# Patient Record
Sex: Female | Born: 1965 | Race: White | Hispanic: No | Marital: Single | Smoking: Former smoker
Health system: Southern US, Community
[De-identification: ages and names within clinical notes are randomized; demographics above are authoritative.]

## PROBLEM LIST (undated history)

## (undated) DIAGNOSIS — F32A Depression, unspecified: Secondary | ICD-10-CM

## (undated) DIAGNOSIS — R42 Dizziness and giddiness: Secondary | ICD-10-CM

## (undated) DIAGNOSIS — I10 Essential (primary) hypertension: Secondary | ICD-10-CM

## (undated) DIAGNOSIS — E785 Hyperlipidemia, unspecified: Secondary | ICD-10-CM

## (undated) DIAGNOSIS — Z9289 Personal history of other medical treatment: Secondary | ICD-10-CM

## (undated) DIAGNOSIS — I509 Heart failure, unspecified: Secondary | ICD-10-CM

## (undated) DIAGNOSIS — K2970 Gastritis, unspecified, without bleeding: Secondary | ICD-10-CM

## (undated) DIAGNOSIS — F329 Major depressive disorder, single episode, unspecified: Secondary | ICD-10-CM

## (undated) DIAGNOSIS — K219 Gastro-esophageal reflux disease without esophagitis: Secondary | ICD-10-CM

## (undated) DIAGNOSIS — F419 Anxiety disorder, unspecified: Secondary | ICD-10-CM

## (undated) DIAGNOSIS — M25552 Pain in left hip: Secondary | ICD-10-CM

## (undated) DIAGNOSIS — M549 Dorsalgia, unspecified: Secondary | ICD-10-CM

## (undated) DIAGNOSIS — G8929 Other chronic pain: Secondary | ICD-10-CM

## (undated) DIAGNOSIS — G43009 Migraine without aura, not intractable, without status migrainosus: Secondary | ICD-10-CM

## (undated) HISTORY — DX: Other chronic pain: G89.29

## (undated) HISTORY — DX: Gastritis, unspecified, without bleeding: K29.70

## (undated) HISTORY — DX: Dizziness and giddiness: R42

## (undated) HISTORY — DX: Dorsalgia, unspecified: M54.9

## (undated) HISTORY — PX: ABLATION: SHX5711

## (undated) HISTORY — DX: Major depressive disorder, single episode, unspecified: F32.9

## (undated) HISTORY — DX: Depression, unspecified: F32.A

## (undated) HISTORY — PX: OTHER SURGICAL HISTORY: SHX169

## (undated) HISTORY — DX: Anxiety disorder, unspecified: F41.9

## (undated) HISTORY — DX: Hyperlipidemia, unspecified: E78.5

## (undated) HISTORY — DX: Migraine without aura, not intractable, without status migrainosus: G43.009

---

## 2002-04-21 ENCOUNTER — Emergency Department (HOSPITAL_COMMUNITY): Admission: EM | Admit: 2002-04-21 | Discharge: 2002-04-21 | Payer: Self-pay | Admitting: Emergency Medicine

## 2002-04-21 ENCOUNTER — Encounter: Payer: Self-pay | Admitting: Emergency Medicine

## 2002-09-04 DIAGNOSIS — I509 Heart failure, unspecified: Secondary | ICD-10-CM

## 2002-09-04 HISTORY — DX: Heart failure, unspecified: I50.9

## 2002-12-31 ENCOUNTER — Emergency Department (HOSPITAL_COMMUNITY): Admission: EM | Admit: 2002-12-31 | Discharge: 2002-12-31 | Payer: Self-pay | Admitting: *Deleted

## 2003-04-25 ENCOUNTER — Emergency Department (HOSPITAL_COMMUNITY): Admission: EM | Admit: 2003-04-25 | Discharge: 2003-04-25 | Payer: Self-pay | Admitting: Emergency Medicine

## 2003-09-05 HISTORY — PX: CHOLECYSTECTOMY: SHX55

## 2003-09-05 HISTORY — PX: LUMBAR DISC SURGERY: SHX700

## 2003-12-19 ENCOUNTER — Emergency Department (HOSPITAL_COMMUNITY): Admission: EM | Admit: 2003-12-19 | Discharge: 2003-12-19 | Payer: Self-pay | Admitting: Emergency Medicine

## 2004-01-13 ENCOUNTER — Ambulatory Visit (HOSPITAL_COMMUNITY): Admission: RE | Admit: 2004-01-13 | Discharge: 2004-01-13 | Payer: Self-pay | Admitting: Family Medicine

## 2004-02-15 ENCOUNTER — Ambulatory Visit (HOSPITAL_COMMUNITY): Admission: RE | Admit: 2004-02-15 | Discharge: 2004-02-15 | Payer: Self-pay | Admitting: Family Medicine

## 2004-02-24 ENCOUNTER — Ambulatory Visit (HOSPITAL_COMMUNITY): Admission: RE | Admit: 2004-02-24 | Discharge: 2004-02-24 | Payer: Self-pay | Admitting: Orthopaedic Surgery

## 2004-03-14 ENCOUNTER — Ambulatory Visit (HOSPITAL_COMMUNITY): Admission: RE | Admit: 2004-03-14 | Discharge: 2004-03-14 | Payer: Self-pay | Admitting: Family Medicine

## 2004-03-18 ENCOUNTER — Emergency Department (HOSPITAL_COMMUNITY): Admission: EM | Admit: 2004-03-18 | Discharge: 2004-03-19 | Payer: Self-pay | Admitting: *Deleted

## 2004-03-23 ENCOUNTER — Emergency Department (HOSPITAL_COMMUNITY): Admission: EM | Admit: 2004-03-23 | Discharge: 2004-03-23 | Payer: Self-pay | Admitting: Emergency Medicine

## 2004-03-25 ENCOUNTER — Ambulatory Visit (HOSPITAL_COMMUNITY): Admission: RE | Admit: 2004-03-25 | Discharge: 2004-03-25 | Payer: Self-pay | Admitting: *Deleted

## 2004-04-01 ENCOUNTER — Emergency Department (HOSPITAL_COMMUNITY): Admission: EM | Admit: 2004-04-01 | Discharge: 2004-04-02 | Payer: Self-pay | Admitting: *Deleted

## 2004-04-04 ENCOUNTER — Ambulatory Visit (HOSPITAL_COMMUNITY): Admission: RE | Admit: 2004-04-04 | Discharge: 2004-04-04 | Payer: Self-pay | Admitting: *Deleted

## 2004-04-08 ENCOUNTER — Inpatient Hospital Stay (HOSPITAL_COMMUNITY): Admission: RE | Admit: 2004-04-08 | Discharge: 2004-04-10 | Payer: Self-pay | Admitting: General Surgery

## 2004-05-11 ENCOUNTER — Ambulatory Visit (HOSPITAL_COMMUNITY): Admission: RE | Admit: 2004-05-11 | Discharge: 2004-05-12 | Payer: Self-pay | Admitting: Neurological Surgery

## 2004-06-08 ENCOUNTER — Ambulatory Visit (HOSPITAL_COMMUNITY): Admission: RE | Admit: 2004-06-08 | Discharge: 2004-06-08 | Payer: Self-pay | Admitting: Family Medicine

## 2004-08-18 ENCOUNTER — Ambulatory Visit (HOSPITAL_COMMUNITY): Admission: RE | Admit: 2004-08-18 | Discharge: 2004-08-18 | Payer: Self-pay | Admitting: Anesthesiology

## 2004-11-11 ENCOUNTER — Ambulatory Visit: Payer: Self-pay | Admitting: *Deleted

## 2004-11-20 ENCOUNTER — Emergency Department (HOSPITAL_COMMUNITY): Admission: EM | Admit: 2004-11-20 | Discharge: 2004-11-20 | Payer: Self-pay | Admitting: Emergency Medicine

## 2004-11-25 ENCOUNTER — Ambulatory Visit: Payer: Self-pay | Admitting: *Deleted

## 2004-12-16 ENCOUNTER — Ambulatory Visit (HOSPITAL_COMMUNITY): Admission: RE | Admit: 2004-12-16 | Discharge: 2004-12-16 | Payer: Self-pay | Admitting: Orthopaedic Surgery

## 2004-12-26 ENCOUNTER — Encounter (HOSPITAL_COMMUNITY): Admission: RE | Admit: 2004-12-26 | Discharge: 2005-01-25 | Payer: Self-pay | Admitting: Orthopaedic Surgery

## 2005-02-16 ENCOUNTER — Ambulatory Visit: Payer: Self-pay | Admitting: *Deleted

## 2005-02-21 ENCOUNTER — Encounter (HOSPITAL_COMMUNITY): Admission: RE | Admit: 2005-02-21 | Discharge: 2005-03-23 | Payer: Self-pay | Admitting: Orthopaedic Surgery

## 2005-02-24 ENCOUNTER — Ambulatory Visit: Payer: Self-pay | Admitting: Cardiovascular Disease

## 2005-02-24 ENCOUNTER — Ambulatory Visit (HOSPITAL_COMMUNITY): Admission: RE | Admit: 2005-02-24 | Discharge: 2005-02-24 | Payer: Self-pay | Admitting: *Deleted

## 2005-03-01 ENCOUNTER — Ambulatory Visit: Payer: Self-pay | Admitting: Cardiovascular Disease

## 2005-03-01 ENCOUNTER — Ambulatory Visit: Payer: Self-pay | Admitting: *Deleted

## 2005-03-03 ENCOUNTER — Emergency Department (HOSPITAL_COMMUNITY): Admission: EM | Admit: 2005-03-03 | Discharge: 2005-03-03 | Payer: Self-pay | Admitting: Emergency Medicine

## 2005-06-16 ENCOUNTER — Emergency Department (HOSPITAL_COMMUNITY): Admission: EM | Admit: 2005-06-16 | Discharge: 2005-06-16 | Payer: Self-pay | Admitting: Emergency Medicine

## 2006-03-30 ENCOUNTER — Ambulatory Visit: Payer: Self-pay | Admitting: *Deleted

## 2007-04-18 ENCOUNTER — Ambulatory Visit: Payer: Self-pay | Admitting: Cardiovascular Disease

## 2007-04-19 ENCOUNTER — Ambulatory Visit (HOSPITAL_COMMUNITY): Admission: RE | Admit: 2007-04-19 | Discharge: 2007-04-19 | Payer: Self-pay | Admitting: Cardiovascular Disease

## 2007-04-19 ENCOUNTER — Ambulatory Visit: Payer: Self-pay | Admitting: Cardiology

## 2007-05-31 ENCOUNTER — Ambulatory Visit (HOSPITAL_COMMUNITY): Admission: RE | Admit: 2007-05-31 | Discharge: 2007-05-31 | Payer: Self-pay | Admitting: Family Medicine

## 2007-07-02 ENCOUNTER — Encounter (HOSPITAL_COMMUNITY): Admission: RE | Admit: 2007-07-02 | Discharge: 2007-08-01 | Payer: Self-pay | Admitting: Neurological Surgery

## 2007-10-25 ENCOUNTER — Emergency Department (HOSPITAL_COMMUNITY): Admission: EM | Admit: 2007-10-25 | Discharge: 2007-10-25 | Payer: Self-pay | Admitting: Emergency Medicine

## 2007-11-27 ENCOUNTER — Ambulatory Visit (HOSPITAL_COMMUNITY): Admission: RE | Admit: 2007-11-27 | Discharge: 2007-11-27 | Payer: Self-pay | Admitting: Family Medicine

## 2008-01-21 ENCOUNTER — Ambulatory Visit (HOSPITAL_COMMUNITY): Admission: RE | Admit: 2008-01-21 | Discharge: 2008-01-21 | Payer: Self-pay | Admitting: Family Medicine

## 2008-09-04 HISTORY — PX: LUMBAR DISC SURGERY: SHX700

## 2008-11-05 ENCOUNTER — Ambulatory Visit (HOSPITAL_COMMUNITY): Admission: RE | Admit: 2008-11-05 | Discharge: 2008-11-05 | Payer: Self-pay | Admitting: Neurological Surgery

## 2008-12-17 ENCOUNTER — Encounter: Payer: Self-pay | Admitting: Cardiology

## 2009-01-01 ENCOUNTER — Encounter (INDEPENDENT_AMBULATORY_CARE_PROVIDER_SITE_OTHER): Payer: Self-pay | Admitting: *Deleted

## 2009-01-14 ENCOUNTER — Ambulatory Visit: Payer: Self-pay | Admitting: *Deleted

## 2009-01-22 ENCOUNTER — Ambulatory Visit (HOSPITAL_COMMUNITY): Admission: RE | Admit: 2009-01-22 | Discharge: 2009-01-22 | Payer: Self-pay | Admitting: Neurological Surgery

## 2009-07-14 ENCOUNTER — Ambulatory Visit (HOSPITAL_COMMUNITY): Admission: RE | Admit: 2009-07-14 | Discharge: 2009-07-14 | Payer: Self-pay | Admitting: Neurological Surgery

## 2009-09-10 ENCOUNTER — Inpatient Hospital Stay (HOSPITAL_COMMUNITY): Admission: RE | Admit: 2009-09-10 | Discharge: 2009-09-12 | Payer: Self-pay | Admitting: Neurological Surgery

## 2009-10-12 ENCOUNTER — Encounter: Admission: RE | Admit: 2009-10-12 | Discharge: 2009-10-12 | Payer: Self-pay | Admitting: Neurological Surgery

## 2009-12-07 ENCOUNTER — Encounter: Admission: RE | Admit: 2009-12-07 | Discharge: 2009-12-07 | Payer: Self-pay | Admitting: Neurological Surgery

## 2010-01-17 DIAGNOSIS — F341 Dysthymic disorder: Secondary | ICD-10-CM | POA: Insufficient documentation

## 2010-01-17 DIAGNOSIS — E785 Hyperlipidemia, unspecified: Secondary | ICD-10-CM | POA: Insufficient documentation

## 2010-01-17 DIAGNOSIS — R109 Unspecified abdominal pain: Secondary | ICD-10-CM | POA: Insufficient documentation

## 2010-01-17 DIAGNOSIS — I428 Other cardiomyopathies: Secondary | ICD-10-CM | POA: Insufficient documentation

## 2010-01-18 ENCOUNTER — Encounter (INDEPENDENT_AMBULATORY_CARE_PROVIDER_SITE_OTHER): Payer: Self-pay | Admitting: *Deleted

## 2010-01-19 ENCOUNTER — Encounter: Admission: RE | Admit: 2010-01-19 | Discharge: 2010-01-19 | Payer: Self-pay | Admitting: Neurological Surgery

## 2010-06-21 ENCOUNTER — Encounter: Admission: RE | Admit: 2010-06-21 | Discharge: 2010-06-21 | Payer: Self-pay | Admitting: Neurological Surgery

## 2010-07-07 ENCOUNTER — Ambulatory Visit (HOSPITAL_COMMUNITY): Admission: RE | Admit: 2010-07-07 | Discharge: 2010-07-07 | Payer: Self-pay | Admitting: Neurological Surgery

## 2010-09-04 DIAGNOSIS — Z9289 Personal history of other medical treatment: Secondary | ICD-10-CM

## 2010-09-04 HISTORY — DX: Personal history of other medical treatment: Z92.89

## 2010-09-24 ENCOUNTER — Encounter: Payer: Self-pay | Admitting: Family Medicine

## 2010-10-06 NOTE — Letter (Signed)
Summary: Appointment - Missed  [facility] [place]  [address] [place] [address]   Phone: [phone]  Fax: [phone]     Jan 18, 2010 MRN: [id number]   Pamela Mendez [address] [place], [place]  [address]   Dear Ms. [name],  Our records indicate you missed your appointment on      01/18/10                  with Joni Reining NP      .            It is very important that we reach you to reschedule this appointment. We look forward to participating in your health care needs. Please contact us at the number listed above at your earliest convenience to reschedule this appointment.     Sincerely,    Glass blower/designer

## 2010-10-26 ENCOUNTER — Ambulatory Visit: Payer: Self-pay | Admitting: Orthopedic Surgery

## 2010-10-27 ENCOUNTER — Other Ambulatory Visit (HOSPITAL_COMMUNITY): Payer: Self-pay | Admitting: Family Medicine

## 2010-10-27 ENCOUNTER — Encounter (HOSPITAL_COMMUNITY): Payer: Self-pay

## 2010-10-27 ENCOUNTER — Ambulatory Visit (HOSPITAL_COMMUNITY)
Admission: RE | Admit: 2010-10-27 | Discharge: 2010-10-27 | Disposition: A | Discharge status: Home / Self Care | Payer: Medicaid Other | Source: Ambulatory Visit | Attending: Family Medicine | Admitting: Family Medicine

## 2010-10-27 DIAGNOSIS — I509 Heart failure, unspecified: Secondary | ICD-10-CM | POA: Insufficient documentation

## 2010-10-27 HISTORY — DX: Heart failure, unspecified: I50.9

## 2010-10-27 HISTORY — DX: Essential (primary) hypertension: I10

## 2010-10-28 ENCOUNTER — Ambulatory Visit (HOSPITAL_COMMUNITY)
Admission: RE | Admit: 2010-10-28 | Discharge: 2010-10-28 | Disposition: A | Discharge status: Home / Self Care | Payer: Medicaid Other | Source: Ambulatory Visit | Attending: Family Medicine | Admitting: Family Medicine

## 2010-10-28 ENCOUNTER — Encounter: Payer: Self-pay | Admitting: Orthopedic Surgery

## 2010-10-28 DIAGNOSIS — I509 Heart failure, unspecified: Secondary | ICD-10-CM | POA: Insufficient documentation

## 2010-10-28 DIAGNOSIS — I10 Essential (primary) hypertension: Secondary | ICD-10-CM | POA: Insufficient documentation

## 2010-11-01 NOTE — Letter (Signed)
Summary: Referral appointment explanation  Referral appointment explanation   Imported By: Jacklynn Ganong 10/28/2010 08:37:33  _____________________________________________________________________  External Attachment:    Type:   Image     Comment:   External Document

## 2010-11-09 ENCOUNTER — Ambulatory Visit (INDEPENDENT_AMBULATORY_CARE_PROVIDER_SITE_OTHER): Payer: Medicaid Other | Admitting: Cardiovascular Disease

## 2010-11-09 ENCOUNTER — Encounter: Payer: Self-pay | Admitting: Cardiovascular Disease

## 2010-11-09 DIAGNOSIS — E785 Hyperlipidemia, unspecified: Secondary | ICD-10-CM

## 2010-11-09 DIAGNOSIS — R0602 Shortness of breath: Secondary | ICD-10-CM

## 2010-11-09 DIAGNOSIS — R609 Edema, unspecified: Secondary | ICD-10-CM | POA: Insufficient documentation

## 2010-11-09 DIAGNOSIS — I10 Essential (primary) hypertension: Secondary | ICD-10-CM

## 2010-11-15 LAB — CREATININE, SERUM: GFR calc Af Amer: >60 mL/min (ref >60)

## 2010-11-15 NOTE — Assessment & Plan Note (Addendum)
Summary: **per Dr.Scott Luking for DOE and hx of CHF/tg   Visit Type:  Follow-up Primary Provider:  Dr.Scott Luking  CC:  DOE and hx of chf.  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  Mild exertional dyspnea.  Had increasing LE edema and torsemide increased  Reviewed echo from 2/21 and EF normla 55-60% with no valve disease.  Edema improved now.  Denies salt indiscretion.  Paresthesias in LE's off neurontin  Improved since back surgery last year.  No PND, orthopnea or SSCP.  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 Take One Tablet By Mouth Daily 2)  Zocor 20 Mg Tabs (Simvastatin) .... Take One Tab Once Daily 3)  Simvastatin 20 Mg Tabs (Simvastatin) .... Take One Tablet By Mouth Daily At Bedtime 4)  Prilosec 20 Mg Cpdr (Omeprazole) .... Take 1 Tab Daily 5)  Torsemide 20 Mg Tabs (Torsemide) .... 3 in The Am 2 Tabs Noon 6)  Zyrtec Allergy 10 Mg Tabs (Cetirizine Hcl) .... Take 1 Tab Daily 7)  Klor-Con 20 Meq Pack (Potassium Chloride) .... Take 2 Tabs Two Times A Day  Allergies (verified): No Known Drug Allergies  Comments:  Nurse/Medical Assistant: patient brought meds she uses [facility] in [place]  Past History:  Past Medical History: Last updated: February 05, 2010 Current Problems:  HYPERLIPIDEMIA (ICD-272.4) DEPRESSION/ANXIETY (ICD-300.4) CARDIOMYOPATHY, MILD (ICD-425.4) ABDOMINAL PAIN, UNSPECIFIED SITE (ICD-789.00)  Past Surgical History: Last updated: 02-05-10 lumbar disk surgery childbirth times 3  Family History: Last updated: 02/05/10 Father:deceased due to myocardial infarction age 58 Mother:alive age 14  Social History: Last updated: 2010/02/05 Single  Tobacco Use -  Yes.  Alcohol Use - no Regular Exercise - no Drug Use - no  Review of Systems       Denies fever, malais, weight loss, blurry vision, decreased visual acuity, cough, sputum, SOB, hemoptysis, pleuritic pain, heartburn, abdominal pain, melena, l claudication, or rash.   Vital Signs:  Patient profile:   45 year old female Height:      66 inches Weight:      204 pounds BMI:     33.05 Pulse rate:   85 / minute BP sitting:   118 / 73  (left arm)  Vitals Entered By: Dreama Saa, CNA (November 09, 2010 2:42 PM)  Physical Exam  General:  Affect appropriate Healthy:  appears stated age HEENT: normal Neck supple with no adenopathy JVP normal no bruits no thyromegaly Lungs clear with no wheezing and good diaphragmatic motion Heart:  S1/S2 no murmur,rub, gallop or click PMI normal Abdomen: benighn, BS positve, no tenderness, no AAA no bruit.  No HSM or HJR Distal pulses intact with no bruits No edema Neuro non-focal Skin warm and dry    Impression & Recommendations:  Problem # 1:  HYPERLIPIDEMIA (ICD-272.4) Continue statin  Labs per primary Her updated medication list for this problem includes:    Zocor 20 Mg Tabs (Simvastatin) .Marland Kitchen... Take one tab once daily    Simvastatin 20 Mg Tabs (Simvastatin) .Marland Kitchen... Take one tablet by mouth daily at bedtime  Problem # 2:  CARDIOMYOPATHY, MILD (ICD-425.4) Resolved EF normal by echo.   Her updated medication list for this problem includes:    Lisinopril 5 Mg Tabs (Lisinopril) .Marland Kitchen... Take one tablet by mouth daily  Torsemide 20 Mg Tabs (Torsemide) .Marland KitchenMarland KitchenMarland KitchenMarland Kitchen 3 in the am 2 tabs noon  Problem # 3:  EDEMA (ICD-782.3) Non cardiac etiology.  Primary can consider 24 hr uring protein TSH and venous duplex.  Improved Low sodium diet  Patient Instructions: 1)  Your physician recommends that you schedule a follow-up appointment in: 1 year 2)  Your physician recommends that you continue on your current medications as directed. Please refer to the  Current Medication list given to you today.

## 2010-11-20 LAB — DIFFERENTIAL
Basophils Absolute: 0.1 10*3/uL (ref 0.0–0.1)
Basophils Relative: 1 % (ref 0–1)
Eosinophils Absolute: 0.3 10*3/uL (ref 0.0–0.7)
Eosinophils Relative: 3 % (ref 0–5)
Monocytes Absolute: 1 10*3/uL (ref 0.1–1.0)
Neutro Abs: 5.1 10*3/uL (ref 1.7–7.7)

## 2010-11-20 LAB — BASIC METABOLIC PANEL
CO2: 29 mEq/L (ref 19–32)
Calcium: 9.1 mg/dL (ref 8.4–10.5)
Glucose, Bld: 76 mg/dL (ref 70–99)
Sodium: 135 mEq/L (ref 135–145)

## 2010-11-20 LAB — CBC
HCT: 43.3 % (ref 36.0–46.0)
Hemoglobin: 15 g/dL (ref 12.0–15.0)
MCHC: 34.7 g/dL (ref 30.0–36.0)
MCV: 90.2 fL (ref 78.0–100.0)
RDW: 13.1 % (ref 11.5–15.5)

## 2010-12-13 LAB — BASIC METABOLIC PANEL
CO2: 30 mEq/L (ref 19–32)
Chloride: 100 mEq/L (ref 96–112)
GFR calc Af Amer: >60 mL/min (ref >60)
Potassium: 3.6 mEq/L (ref 3.5–5.1)
Sodium: 139 mEq/L (ref 135–145)

## 2010-12-13 LAB — CBC
Hemoglobin: 14.4 g/dL (ref 12.0–15.0)
MCHC: 35.2 g/dL (ref 30.0–36.0)
MCV: 89.6 fL (ref 78.0–100.0)
RBC: 4.56 MIL/uL (ref 3.87–5.11)
WBC: 8.3 10*3/uL (ref 4.0–10.5)

## 2010-12-13 LAB — DIFFERENTIAL
Basophils Relative: 1 % (ref 0–1)
Eosinophils Absolute: 0.2 10*3/uL (ref 0.0–0.7)
Lymphs Abs: 2 10*3/uL (ref 0.7–4.0)
Monocytes Absolute: 0.9 10*3/uL (ref 0.1–1.0)
Monocytes Relative: 11 % (ref 3–12)

## 2010-12-13 LAB — ABO/RH: ABO/RH(D): O NEG

## 2010-12-13 LAB — TYPE AND SCREEN: Antibody Screen: NEGATIVE

## 2010-12-13 LAB — APTT: aPTT: 28 seconds (ref 24–37)

## 2011-01-17 NOTE — Assessment & Plan Note (Signed)
[facility]                       [place] [facility] OFFICE NOTE   Pamela Mendez                       MRN:          [id number]  DATE:04/18/2007                            DOB:          July 04, 1966    Pamela Mendez is seen today as a new patient by me.  She has previously been  seen by Pamela Mendez.  She called the office for refills on her medication  and basically needed to be reevaluated since she has not been seen in  awhile.  The patient has a history of postpartum cardiomyopathy.  This  was diagnosed in 2004 with the birth of her youngest daughter, Pamela Mendez.  Interestingly the patient is divorced.  She has a 23-year-old daughter  who is with her today, but her other children are 51 and 19.   The patient's coronary risk factors include hyperlipidemia,  hypertension, and smoking.   She has not had any significant PND or orthopnea.  There has been mild  exertional dyspnea.  She is functional class I.  She does have some  chronic lower extremity edema which she takes Demadex for.  She has not  had any significant chest pain or syncope.   In talking to the patient she has been doing fairly well.  She continues  to smoke.  She used to smoke two packs a day and says she is down to  four or five cigarettes a day now.   She was not interested in Chantix or Wellbutrin.   The patient has been compliant with her medications.  She does tend to  watch the salt in her diet as she is aware of the relationship to her  lower extremity edema.   She does not work, but is active at home particularly with her 5-year-  old daughter.   REVIEW OF SYSTEMS:  Otherwise, negative.   MEDICATIONS:  1. Klor-Con 20 a day.  2. Simvastatin 20 a day.  3. Lisinopril 5 a day.  4. An aspirin a day.  5. Demadex 40 a day.  6. Lyrica 75 b.i.d.   She has had previous lower back surgeries.  She is getting increasing  sciatica on the left side and may need repeat surgery.   Her neurosurgeon  is Dr. Marikay Mendez.   PHYSICAL EXAMINATION:  GENERAL:  A healthy-appearing middle-aged white  female in no distress.  Affect is appropriate.  She does have nicotine  on her breath with staining of her teeth.  VITAL SIGNS:  Her weight is stable at 186, blood pressure 118/74, pulse  72 and regular, afebrile, respiratory rate 14.  HEENT:  Normal.  NECK:  Supple.  There is no thyromegaly, no lymphadenopathy, no JVP  elevation, no bruits.  LUNGS:  Clear with good diaphragmatic motion, no wheezing.  BACK:  She has previous lumbar surgery scar on the lower back.  HEART:  There is an S1, S2 with normal heart sounds.  PMI is normal.  ABDOMEN:  Benign.  Bowel sounds positive.  No tenderness, no  hepatosplenomegaly, hepatojugular reflux, no AAA, no bruits.  EXTREMITIES:  Femorals are +4 bilaterally without  bruit.  PT's are +3  bilaterally.  There is trace lower extremity edema.  NEUROLOGIC:  Nonfocal.  There is no muscular weakness.  SKIN:  Warm and dry.   Her baseline EKG shows sinus rhythm with poor R wave progression.   IMPRESSION:  1. History of postpartum cardiomyopathy currently functional class 1.      Her last echocardiogram done in June 2006 showed an EF 60% with      recovery particularly since she is still having lower extremity      edema and may need repeat back surgery.  She will have a followup      2D echocardiogram to assess this.  She will continue her current      dosages of medicine including 5 of lisinopril.  2. Lower extremity edema.  May be more related to venous      insufficiency.  It is mild.  She will continue her Demadex and low      salt diet.  3. Hypercholesterolemia on Simvastatin 20 a day.  She will follow up      with Pamela Mendez who will be her new primary care doctor.  She      will get a fasting lipid and liver profile.  4. Lumbar sciatica.  Previous surgery two years ago.  Possible need      for repeat surgery.  So long as her left  ventricle function is      normal by echocardiogram, I do not think she would need any further      workup and she would be cleared from surgery from a cardiac      standpoint.  5. Smoking.  The patient will continue to try to quit on her own.  She      is actually a reasonable candidate for nicotine replacement, and I      told her that she would have options of Chantix, Wellbutrin or      nicotine patches.  She will again follow up with Pamela Mendez in      regards to this.   So long as her echocardiogram shows normal left ventricular function,  she will be seen in a year.  I did tell her that I would like to check a  BMET and BNP as well to assess her filling pressures and make sure her  potassium and creatinine are in a good range with her current dosages of  diuretic and Klor-Con.     Pamela Pick. Eden Emms, MD, [facility]  Electronically Signed    PCN/MedQ  DD: 04/18/2007  DT: 04/19/2007  Job #: [id number]

## 2011-01-17 NOTE — Consult Note (Signed)
VASCULAR SURGERY CONSULTATION   Pamela Mendez, Pamela Mendez  DOB:  07/09/1966                                       01/14/2009  [id number]#:[id number]   REFERRING PHYSICIAN:  Marikay Alar, MD   PRIMARY CARE PHYSICIAN:  Scott A. Luking, MD   REFERRAL DIAGNOSIS:  L5-S1 degenerative disk disease.   HISTORY:  The patient is a 46 year old female with a history of chronic  back pain.  It is now quite severe.  It is worsen when she is more  active or with prolonged sitting, standing or walking.  She does have  some radiation pain into her left buttock and left leg.  Back pain is  the most severe component of her discomfort.  No bowel or bladder  symptoms.  Denies numbness, tingling or weakness.   No history of DVT or pulmonary embolus.  No major abdominal surgery.  She has had a laparoscopic cholecystectomy.   PAST MEDICAL HISTORY:  Postpartum congestive heart failure.   MEDICATIONS:  1. Klor-Con M20 one tablet daily.  2. Demadex 20 mg b.i.d.  3. Lisinopril 5 mg daily.  4. Prilosec 20 mg daily.  5. Zocor 20 mg daily.  6. Allegra D one tablet b.i.d.  7. Aspirin 325 mg daily.   ALLERGIES:  Sulfa.   SOCIAL HISTORY:  The patient is single.  She has three children.  She is  currently unemployed.  She smokes 3-4 cigarettes daily.  No regular  alcohol use.   FAMILY HISTORY:  Mother is living age 5, generally well.  Father  deceased age 70 with a history of heart disease and cancer.  Three  siblings are well.   REVIEW OF SYSTEMS:  The patient notes some shortness of breath with  exertion.  Occasional headaches.  Refer to patient encounter form.   PHYSICAL EXAM:  General:  A 45 year old female appears approximately her  stated age.  Alert and oriented.  No acute distress.  Vital signs:  BP  111/74, pulse 84 per minute.  HEENT:  Unremarkable.  Neck:  Supple.  No  thyromegaly or adenopathy.  Chest:  Equal entry bilaterally without  rales or rhonchi.  Cardiovascular:  No carotid  bruits.  Normal heart  sounds without murmurs.  Regular rate and rhythm.  Abdomen:  Soft,  nontender.  Laparoscopic cholecystectomy scar is noted.  No masses or  organomegaly.  Lower extremities:  Intact femoral, popliteal, posterior  tibial, dorsalis pedis pulses.  No ankle edema.  Neurological:  Cranial  nerves intact.  Strength equal bilaterally.  1+ reflexes.  Skin:  Warm,  dry and intact.   IMPRESSION:  1. L5-S1 degenerative disk disease.  2. Remote history of postpartum congestive heart failure.   RECOMMENDATIONS:  The patient is a candidate to undergo L5-S1 ALIF.  Potential risks of the operative procedure have been reviewed with the  patient.  These include but are not limited to bleeding, transfusion,  limb ischemia, DVT, pulmonary embolus, ureter injury, hernia or deep  infection.   Balinda Quails, M.D.  Electronically Signed  PGH/MEDQ  D:  01/14/2009  T:  01/15/2009  Job:  [id number]   cc:   Tia Alert, MD  Scott A. Gerda Diss, MD

## 2011-01-17 NOTE — Procedures (Signed)
NAMEROWYN, MUSTAPHA NO.:  [id number]   MEDICAL RECORD NO.:  [id number]          PATIENT TYPE:  OUT   LOCATION:  RAD                           FACILITY:  [facility]   PHYSICIAN:  Gerrit Friends. Dietrich Pates, MD, FACCDATE OF BIRTH:  09-19-1965   DATE OF PROCEDURE:  04/19/2007  DATE OF DISCHARGE:  04/19/2007                                ECHOCARDIOGRAM   CLINICAL DATA:  [age]-year-old woman with hypertension.   M-MODE:  Aorta 2.7, left atrium 3.5, septum 1.1, posterior wall 1, LV  diastole 4.7, LV systole 3.8.   FINDINGS:  1. Technically suboptimal, but adequate echocardiographic study.  2. Trileaflet aortic valve with minimal sclerosis and normal function.  3. Normal left atrium, right atrium and right ventricle.  4. Normal pulmonic valve and proximal pulmonary artery.  5. Normal tricuspid and mitral valve; physiologic tricuspid      regurgitation.  6. Left ventricular size at the upper limit of normal; no LVH; normal      regional and global function.  7. Normal IVC.  8. Comparison with prior study of February 24, 2005:  No significant      interval change.      Gerrit Friends. Dietrich Pates, MD, [facility]  Electronically Signed     RMR/MEDQ  D:  04/24/2007  T:  04/25/2007  Job:  [id number]

## 2011-01-20 NOTE — Op Note (Signed)
NAME:  Pamela Mendez, Pamela Mendez                          ACCOUNT NO.:  [id number]   MEDICAL RECORD NO.:  [id number]                   PATIENT TYPE:  INP   LOCATION:  A211                                 FACILITY:  [facility]   PHYSICIAN:  Dirk Dress. Katrinka Blazing, M.D.                DATE OF BIRTH:  [birth year]   DATE OF PROCEDURE:  04/08/2004  DATE OF DISCHARGE:  04/10/2004                                 OPERATIVE REPORT   PREOPERATIVE DIAGNOSIS:  Chronic cholecystitis, cholelithiasis.   POSTOPERATIVE DIAGNOSIS:  Chronic cholecystitis, cholelithiasis.   PROCEDURE:  Laparoscopic cholecystectomy.   SURGEON:  Dirk Dress. Katrinka Blazing, M.D.   DESCRIPTION OF PROCEDURE:  Under general anesthesia the patient's abdomen  was prepped and draped in a sterile field.  A supraumbilical midline  incision was made and a Veress needle was inserted uneventfully.  The  abdomen was insufflated with 1 liters of CO2.  Using a Vis-A-Port guide a 10-  mm port was placed.  A laparoscope was placed.  A distended, minimally  thickened, gallbladder was noted.   Under videoscopic guidance a 10-mm port and two 5-mm ports were placed in  the right subcostal region.  The gallbladder was grasped and positioned by  the assistant.  The cystic artery was dissected. There was some enlargement  of the lymph nodes in this area.  The cystic artery was followed back to the  gallbladder, clipped with 3 clips and divided.  The cystic duct ran  transverse to the gallbladder.  It was dissected and followed back to the  gallbladder and clipped close to the gallbladder with 5 clips and divided.  There was a small posterior cystic artery branch which was clipped with 3  clips and divided.   Using electrocautery the gallbladder was separated from the infrahepatic bed  without difficulty.  It was placed in an EndoCatch device and retrieved.  There was no bleeding from the bed.  There was no evidence of bile leak.  Copious irrigation was carried out and the fluid  was entirely clear.  The  patient tolerated the procedure well.  CO2 was allowed to escape from the  abdomen and the ports were removed.  The incisions were closed with #0 Dexon  on the fascia and staples on the skin.  The patient was awakened from  anesthesia,  transferred to a bed, and taken to the postanesthetic care unit  for further monitoring.      ___________________________________________                                            Dirk Dress. Katrinka Blazing, M.D.   LCS/MEDQ  D:  04/08/2004  T:  04/10/2004  Job:  [id number]

## 2011-01-20 NOTE — Procedures (Signed)
NAMENUVIA, HILEMAN NO.:  [id number]   MEDICAL RECORD NO.:  [id number]          PATIENT TYPE:  OUT   LOCATION:  RAD                           FACILITY:  [facility]   PHYSICIAN:  Charlton Haws, M.D.     DATE OF BIRTH:  1965/10/07   DATE OF PROCEDURE:  02/24/2005  DATE OF DISCHARGE:                                  ECHOCARDIOGRAM   PROCEDURE:  2-D echocardiogram.   INDICATIONS FOR PROCEDURE:  Shortness of breath.  Clinical information.  The  patient has hypertension.   RESULTS:  Left ventricular cavity size was normal.  There was no LVH.  Septal thickness was 10 mm.  Ejection fraction was 60%.  Mitral, aortic, and  tricuspid valves are structurally normal.  There was no significant stenosis  or regurgitation.  Left atrium and right-sided cardiac chambers were normal.  There is no evidence of pulmonary hypertension.  Subcostal imaging revealed  no atrial septal defect, no source of embolus, and no effusion.   IMPRESSION:  1.  Normal ejection fraction at 60%, no left ventricular hypertrophy.  2.  Normal left atrium, right atrium, and right ventricle.  3.  No evidence of pulmonary hypertension.  4.  Normal aortic, mitral, and tricuspid valves.  5.  No pericardial effusions.       PN/MEDQ  D:  02/24/2005  T:  02/24/2005  Job:  [id number]

## 2011-01-20 NOTE — Procedures (Signed)
NAMEEMBERLEY, KRAL NO.:  [id number]   MEDICAL RECORD NO.:  [id number]          PATIENT TYPE:  OUT   LOCATION:  RAD                           FACILITY:  [facility]   PHYSICIAN:  Charlton Haws, M.D.     DATE OF BIRTH:  Oct 08, 1965   DATE OF PROCEDURE:  DATE OF DISCHARGE:                                  ECHOCARDIOGRAM   Ms. Blacksher is a 45 year old patient of Dr. Dorethea Clan and Dr. Lady Gary. She has a  history of post partum dilated cardiomyopathy with previous EFs in the 45 to  50% range in 2004. She has been having chest pain. The study is done to rule  out ischemia.   The patient exercised 7 minutes and 24 seconds on a standard Bruce protocol.  Exercise was stopped due to fatigue. EKG showed sinus rhythm with  nonspecific ST-T wave changes.   Resting echocardiographic images showed mild left ventricular cavity  enlargement. The ejection fraction was normal in the 55 to 60% range. There  were no baseline regional wall motion abnormalities.   Stress images were captured in a range of heart rates of 140 to 150. The  septum and apex were somewhat poorly visualized. With stress, the ejection  fraction increased to the 75% range. LV cavity size decreased. There was a  possible small area of septal hypokinesis seen on short axis imaging;  however, overall, this would be considered low risk study.   IMPRESSION:  Low risk stress echocardiogram, possible small area of septal  hypokinesis with stress but area poorly visualized. Decreased left  ventricular cavity size with stress and ejection fraction increasing to 75%.       PN/MEDQ  D:  02/24/2005  T:  02/24/2005  Job:  [id number]   cc:   Vida Roller, M.D.  Fax: [phone]   Dr. Lady Gary

## 2011-01-20 NOTE — H&P (Signed)
NAME:  Pamela Mendez, Pamela Mendez                          ACCOUNT NO.:  [id number]   MEDICAL RECORD NO.:  [id number]                   PATIENT TYPE:  AMB   LOCATION:  DAY                                  FACILITY:  [facility]   PHYSICIAN:  Jerolyn Shin C. Katrinka Blazing, M.D.                DATE OF BIRTH:  [birth year]   DATE OF ADMISSION:  DATE OF DISCHARGE:                                HISTORY & PHYSICAL   A 45 year old female with a history of recurrent severe abdominal pain,  nausea, vomiting.  She has had the pain for quite some time.  She was seen  in the emergency room last weekend and a gallbladder ultrasound showed  multiple gallstones.  She has chronic nausea.  The patient remains  symptomatic and is scheduled for laparoscopic cholecystectomy.   PAST HISTORY:  1. Postpartum cardiomyopathy which has been followed for about two years in     [place].  She had a recent echocardiogram which showed a normal     left ventricular function with no wall abnormalities.  Recent evaluation     by Dr. Dorethea Clan, on April 06, 2004, suggested that no further cardiac     workup is necessary, and he has made some changes in her cardiovascular     medication.  2. Lumbar disk disease with L5-S1 HNP with left sided L5 nerve root     compression.  She is presently being considered for operative therapy of     this.   MEDICATIONS:  1. Flexeril 5 mg t.i.d.  2. Zocor 20 mg every day.  3. Digoxin 0.25 mg every day.  4. Potassium chloride 20 mEq every day.  5. Aspirin 325 mg every day.  6. Wellbutrin 150 mg every day.  7. Oxycodone q.4h. p.r.n. pain.  8. Valium 5 mg q.6h. p.r.n. pain.  Demadex, Coreg, and Captopril are presently being titrated by Dr. Dorethea Clan,  and I do not have the most recent dose.   SOCIAL HISTORY:  The patient is unemployed.  She is single.  She has three  children.  She smokes about a pack of cigarettes per day.  She does not  drink alcohol, and she denies drug use.   REVIEW OF SYSTEMS:  Positive  for abdominal pain and back pain.   FAMILY HISTORY:  Father died of heart attack at age 55.  He also had lung  cancer.  Mother is alive at age 36.  She has three siblings who are alive  and well.   PHYSICAL EXAMINATION:  VITAL SIGNS:  Blood pressure 120/78, pulse 80,  respirations 20, weight 176 pounds.  HEENT:  Unremarkable except for poor dentition.  NECK:  Supple.  Neck shows no JVD, bruit, adenopathy, or thyromegaly.  CHEST:  Clear to auscultation.  HEART:  Regular rate and rhythm.  No murmur, gallop or rub.  ABDOMEN:  Moderate epigastric and right upper quadrant tenderness with some  right lower quadrant tenderness.  She has normoactive bowel sounds.  EXTREMITIES:  No cyanosis, clubbing, or edema.  Good peripheral pulses.  No  joint deformity.  BACK:  Reveals diffuse lumbar tenderness without muscle spasm.  Straight leg  raising is positive on the left at 30 degrees, absent on the right.   IMPRESSION:  1. Cholelithiasis with cholecystitis.  2. Severe lumbar disk disease with L5-S1 herniated nucleus pulposus.  3. Postpartum cardiomyopathy, well compensated with recent normal left     ventricular ejection fraction with no evidence of wall abnormalities.  4. Depression.  5. Hyperlipidemia.   PLAN:  The patient will have laparoscopic cholecystectomy and will have  telemetry monitoring in the early postoperative period.  If she should  develop any problems, cardiology will be consulted.     ___________________________________________                                         Dirk Dress. Katrinka Blazing, M.D.   LCS/MEDQ  D:  04/07/2004  T:  04/07/2004  Job:  [id number]

## 2011-01-20 NOTE — Assessment & Plan Note (Signed)
[facility]                         Pamela CARDIOLOGY OFFICE NOTE   Mendez, Pamela Mendez                       MRN:          [id number]  DATE:03/30/2006                            DOB:          07-15-1966    PRIMARY:  Used to be Mirna Mires, I am not sure who it is now.   [name] returns.  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  Ejection fraction was down to  about 45%.  She subsequently resolved that with an echocardiogram a year  later where she had normal LV systolic function.  Subsequent repeat of that  echocardiogram in 2006 showed normal LV systolic function.  I saw her back  in 2003 and at that time she had some discomfort in her chest as well so we  got a stress echocardiogram which also looked normal and her EF augmented to  75% so overall she is doing well.  She has a little bit of hypertension and  she is on Zestril for that, but otherwise feels well.   CURRENT MEDICATIONS:  1.  Zocor 20 mg once a day.  2.  Zestril 5 mg once a day.  3.  Aspirin 325 once a day.  4.  She takes Lyrica 75 mg twice a day.  5.  Prilosec 20 mg a day.   PHYSICAL EXAMINATION:  VITAL SIGNS:  Her blood pressure is 106/78, her pulse  is 78.  She weighs 182 pounds.  CHEST:  Clear.  NECK:  She has no jugular venous distention or carotid bruits.  CARDIOVASCULAR:  Regular with no murmur.  EXTREMITIES:  Her lower extremities are without significant edema.  There  may be some mild trace edema at the ankles but it is relatively mild.   Overall, I think she is doing well.  Her myopathy appears to have resolved.  Her primary care physician is following her cholesterol and blood pressure  and doing a reasonably good job with that, she tells me so we are going to  get her seen on an as needed basis if she needs to be seen here again.                                   Farris Has. Dorethea Clan, MD   JMH/MedQ  DD:  03/30/2006  DT:  03/30/2006  Job #:  [id number]

## 2011-01-20 NOTE — Procedures (Signed)
NAME:  Pamela Mendez, Pamela Mendez                          ACCOUNT NO.:  [id number]   MEDICAL RECORD NO.:  [id number]                   PATIENT TYPE:  OUT   LOCATION:  RAD                                  FACILITY:  [facility]   PHYSICIAN:  [place] Bing, M.D.               DATE OF BIRTH:  1966/04/12   DATE OF PROCEDURE:  03/25/2004  DATE OF DISCHARGE:                                  ECHOCARDIOGRAM   CONSULTING PHYSICIAN:  [place] Bing, M.D.   REFERRING:  Imagene Sheller.   CLINICAL DATA:  A 44 year old woman with a history of hypertension and  congestive heart failure.   M-MODE:  1. Aorta 2.7.  2. Left atrium 3.0.  3. Septum 1.1.  4. Posterior wall 0.9.  5. LV diastole 4.5.  6. LV systole 2.9.   1. Technically adequate echocardiographic study.  2. Normal left atrium, right atrium, and right ventricle.  3. Normal mitral, tricuspid, and aortic valves.  Mild mitral anular     calcification and mild calcification of the proximal ascending aorta.  4. Pulmonic valve not well seen - probably normal.  5. Normal internal dimension of the left ventricle.  No LVH.  Normal     regional and global LV systolic function.  6. Normal IVC.      ___________________________________________                                            [place] Bing, M.D.   RR/MEDQ  D:  03/25/2004  T:  03/25/2004  Job:  [id number]

## 2011-01-20 NOTE — Op Note (Signed)
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   PROCEDURE:  Lumbar hemilaminectomy, medial facetectomy, foraminotomy, L5-S1  on the left, microdiskectomy at L5-S1 on the left utilizing microscopic  dissection.   SURGEON:  Tia Alert, M.D.   ANESTHESIA:  General endotracheal.   COMPLICATIONS:  None apparent.   INDICATIONS FOR PROCEDURE:  [name] is a 45 year old white female who was  referred to the neurosurgical unit with complaints of left leg pain and an  S1 distribution. She had an MRI which showed a herniated disk at L5-S1 on  the left. She had tried medical management for some time without significant  relief.  I recommended a lumbar microdiskectomy at L5-S1 on the left. She  understood the risks, benefits, and alternatives and wished to proceed.   DESCRIPTION OF PROCEDURE:  The patient was taken to the operating room and  after induction of adequate endotracheal anesthesia, she was rolled on the  prone position of the Wilson frame and all pressure points were padded and  the lumbar region was prepped with Duraprep and then draped in the usual  sterile fashion.  5 mL of local anesthesia was injected and then a dorsal  midline incision was made and carried down to the lumbosacral fascia. The  fascia was opened and the paraspinous musculature was taken down in a  subperiosteal fashion to expose the L5-S1 interspace on the left side.  Intraoperative x-ray  confirmed the level and then a hemilaminectomy, medial  facetectomy and foraminotomy was performed at L5-S1 on the left utilizing  the Kerrison punch.  The yellow ligament was identified, opened and removed  with the Kerrison punch to expose the underlying dura and S1 nerve root. The  S1 nerve root was retracted medially and a fairly large subannular disk  herniation was identified. This was incised after the epidural venous  vasculature was coagulated and cut sharply. A thorough intradiskal  diskectomy was then performed with pituitary rongeurs. I then used nerve  hooks to fill into the midline and entered the foramen to assure adequate  decompression of the nerve root, irrigated it with copious amounts of  bacitracin containing saline solution, dried all bleeding points, removed  the retractor and closed the fascia with #1 Vicryl, closed the subcutaneous  and subcuticular tissue with 2-0 and 3-0 Vicryl and closed  the skin with  Dermabond. The drapes were removed, the patient was awakened from general  anesthesia and transferred to the recovery room in stable condition.  At the  end of the procedure, all sponge, needle and instrument counts were correct.                                               Tia Alert, MD   DSJ/MEDQ  D:  05/11/2004  T:  05/11/2004  Job:  [id number]

## 2011-01-20 NOTE — Procedures (Signed)
NAMECALY, PELLUM NO.:  [id number]   MEDICAL RECORD NO.:  [id number]          PATIENT TYPE:  OUT   LOCATION:  RAD                           FACILITY:  [facility]   PHYSICIAN:  Charlton Haws, M.D.     DATE OF BIRTH:  [birth year]   DATE OF PROCEDURE:  DATE OF DISCHARGE:                                    STRESS TEST   HISTORY:  Ms. Skora is a 45 year old female with history of postpartum  dilated cardiomyopathy with an EF 45-50% back in 2004. She had a repeat  echocardiogram in July 2005 which revealed normal LV function. She now  presents with recurrent peripheral edema and some chest discomfort with  exercise.   BASELINE DATA:  Electrocardiogram reveals sinus rhythm at 87 beats per  minute with nonspecific ST abnormalities. Blood pressure is 122/80.   Patient exercised for a total of 7 minutes of 24 seconds Bruce protocol  stage 3 at 10.1. Maximal heart rate was 157 beats per minute which is 86% of  predicted maximum. Maximum blood pressure is 168/72 and resolved down to  128/68 in recovery. The patient reported some shortness of breath. No chest  discomfort was noted. EKG revealed PVCs. She had upsloping ST depression in  inferior leads. No ischemia was noted. Exercise was stopped secondary to  fatigue.   Echocardiographic images and results are pending M.D.  review.      Amy B   AB/MEDQ  D:  02/24/2005  T:  02/24/2005  Job:  [id number]

## 2011-03-14 ENCOUNTER — Encounter: Payer: Self-pay | Admitting: Cardiovascular Disease

## 2011-05-26 LAB — CBC
Hemoglobin: 14.8
MCHC: 35.5
RBC: 4.79
WBC: 6.8

## 2011-05-26 LAB — INFLUENZA A+B VIRUS AG-DIRECT(RAPID): Inflenza A Ag: NEGATIVE

## 2011-05-26 LAB — BASIC METABOLIC PANEL
BUN: 4 — ABNORMAL LOW
Calcium: 8.8
GFR calc non Af Amer: >60
Glucose, Bld: 107 — ABNORMAL HIGH
Potassium: 2.3 — CL

## 2011-05-26 LAB — DIFFERENTIAL
Lymphocytes Relative: 5 — ABNORMAL LOW
Lymphs Abs: 0.3 — ABNORMAL LOW
Monocytes Absolute: 0.9
Monocytes Relative: 13 — ABNORMAL HIGH
Neutro Abs: 5.6

## 2011-05-26 LAB — URINALYSIS, ROUTINE W REFLEX MICROSCOPIC
Bilirubin Urine: NEGATIVE
Specific Gravity, Urine: 1.02
pH: 6.5

## 2011-05-26 LAB — URINE MICROSCOPIC-ADD ON

## 2011-06-22 ENCOUNTER — Other Ambulatory Visit (HOSPITAL_COMMUNITY)
Admission: RE | Admit: 2011-06-22 | Discharge: 2011-06-22 | Disposition: A | Discharge status: Home / Self Care | Payer: Medicaid Other | Source: Ambulatory Visit | Attending: Obstetrics & Gynecology | Admitting: Obstetrics & Gynecology

## 2011-06-22 ENCOUNTER — Other Ambulatory Visit: Payer: Self-pay | Admitting: Obstetrics & Gynecology

## 2011-06-22 DIAGNOSIS — Z113 Encounter for screening for infections with a predominantly sexual mode of transmission: Secondary | ICD-10-CM | POA: Insufficient documentation

## 2011-06-22 DIAGNOSIS — Z01419 Encounter for gynecological examination (general) (routine) without abnormal findings: Secondary | ICD-10-CM | POA: Insufficient documentation

## 2011-08-07 ENCOUNTER — Encounter (HOSPITAL_COMMUNITY): Payer: Self-pay | Admitting: Pharmacy Technician

## 2011-08-11 ENCOUNTER — Other Ambulatory Visit: Payer: Self-pay

## 2011-08-11 ENCOUNTER — Other Ambulatory Visit: Payer: Self-pay | Admitting: Obstetrics and Gynecology

## 2011-08-11 ENCOUNTER — Encounter (HOSPITAL_COMMUNITY): Payer: Self-pay

## 2011-08-11 ENCOUNTER — Encounter: Payer: Self-pay | Admitting: Obstetrics and Gynecology

## 2011-08-11 ENCOUNTER — Encounter (HOSPITAL_COMMUNITY)
Admission: RE | Admit: 2011-08-11 | Discharge: 2011-08-11 | Disposition: A | Discharge status: Home / Self Care | Payer: Medicaid Other | Source: Ambulatory Visit | Attending: Obstetrics & Gynecology | Admitting: Obstetrics & Gynecology

## 2011-08-11 HISTORY — DX: Depression, unspecified: F32.A

## 2011-08-11 HISTORY — DX: Major depressive disorder, single episode, unspecified: F32.9

## 2011-08-11 HISTORY — DX: Gastro-esophageal reflux disease without esophagitis: K21.9

## 2011-08-11 HISTORY — DX: Anxiety disorder, unspecified: F41.9

## 2011-08-11 LAB — URINALYSIS, ROUTINE W REFLEX MICROSCOPIC
Bilirubin Urine: NEGATIVE
Glucose, UA: NEGATIVE mg/dL
Specific Gravity, Urine: 1.01 (ref 1.005–1.030)
Urobilinogen, UA: 0.2 mg/dL (ref 0.0–1.0)

## 2011-08-11 LAB — CBC
HCT: 36.6 % (ref 36.0–46.0)
MCH: 30.5 pg (ref 26.0–34.0)
MCV: 87.1 fL (ref 78.0–100.0)
Platelets: 263 10*3/uL (ref 150–400)
RBC: 4.2 MIL/uL (ref 3.87–5.11)
WBC: 15.4 10*3/uL — ABNORMAL HIGH (ref 4.0–10.5)

## 2011-08-11 LAB — BASIC METABOLIC PANEL
CO2: 28 mEq/L (ref 19–32)
Calcium: 10.1 mg/dL (ref 8.4–10.5)
Chloride: 101 mEq/L (ref 96–112)
Creatinine, Ser: 1.19 mg/dL — ABNORMAL HIGH (ref 0.50–1.10)
Glucose, Bld: 127 mg/dL — ABNORMAL HIGH (ref 70–99)
Sodium: 140 mEq/L (ref 135–145)

## 2011-08-11 LAB — URINE MICROSCOPIC-ADD ON

## 2011-08-11 NOTE — Patient Instructions (Signed)
[address] RO[address]HOURI  08/11/2011   Your procedure is scheduled on:  08/16/2011  Report to [facility] at 06:15 AM.  Call this number if you have problems the morning of surgery: [phone]   Remember:   Do not eat food:After Midnight.  May have clear liquids:until Midnight .  Clear liquids include soda, tea, black coffee, apple or grape juice, broth.  Take these medicines the morning of surgery with A SIP OF WATER: Zyrtec, Nexium, Vicodin and Lisinopril.   Do not wear jewelry, make-up or nail polish.  Do not wear lotions, powders, or perfumes. You may wear deodorant.  Do not shave 48 hours prior to surgery.  Do not bring valuables to the hospital.  Contacts, dentures or bridgework may not be worn into surgery.  Leave suitcase in the car. After surgery it may be brought to your room.  For patients admitted to the hospital, checkout time is 11:00 AM the day of discharge.   Patients discharged the day of surgery will not be allowed to drive home.  Name and phone number of your driver:   Special Instructions: CHG Shower Use Special Wash: 1/2 bottle night before surgery and 1/2 bottle morning of surgery.   Please read over the following fact sheets that you were given: Pain Booklet, MRSA Information, Surgical Site Infection Prevention, Anesthesia Post-op Instructions and Care and Recovery After Surgery     Dilation and Curettage or Vacuum Curettage Dilation and curettage (D&C) and vacuum curettage are minor procedures. A D&C involves stretching (dilation) the cervix and scraping (curettage) the inside lining of the womb (uterus). During a D&C, tissue is gently scraped from the inside lining of the uterus. During a vacuum curettage, the lining and tissue in the uterus are removed with the use of gentle suction. Curettage may be performed for diagnostic or therapeutic purposes. As a diagnostic procedure, curettage is performed for the purpose of examining tissues from the uterus. Tissue examination  may help determine causes or treatment options for symptoms. A diagnostic curettage may be performed for the following symptoms:  Irregular bleeding in the uterus.   Bleeding with the development of clots.   Spotting between menstrual periods.   Prolonged menstrual periods.   Bleeding after menopause.   No menstrual period (amenorrhea).   A change in size and shape of the uterus.  A therapeutic curettage is performed to remove tissue, blood, or a contraceptive device. Therapeutic curettage may be performed for the following conditions:   Removal of an IUD (intrauterine device).   Removal of retained placenta after giving birth. Retained placenta can cause bleeding severe enough to require transfusions or an infection.   Abortion.   Miscarriage.   Removal of polyps inside the uterus.   Removal of uncommon types of fibroids (noncancerous lumps).  LET YOUR CAREGIVER KNOW ABOUT:   Allergies to food or medicine.   Medicines taken, including vitamins, herbs, eyedrops, over-the-counter medicines, and creams.   Use of steroids (by mouth or creams).   Previous problems with anesthetics or numbing medicines.   History of bleeding problems or blood clots.   Previous surgery.   Other health problems, including diabetes and kidney problems.   Possibility of pregnancy, if this applies.  RISKS AND COMPLICATIONS   Excessive bleeding.   Infection of the uterus.   Damage to the cervix.   Development of scar tissue (adhesions) inside the uterus, later causing abnormal amounts of menstrual bleeding.   Complications from the general anesthetic, if a general  anesthetic is used.   Putting a hole (perforation) in the uterus. This is rare.  BEFORE THE PROCEDURE   Eat and drink before the procedure only as directed by your caregiver.   Arrange for someone to take you home.  PROCEDURE   This procedure may be done in a hospital, outpatient clinic, or caregiver's office.   You  may be given a general anesthetic or a local anesthetic in and around the cervix.   You will lie on your back with your legs in stirrups.   There are two ways in which your cervix can be softened and dilated. These include:   Taking a medicine.   Having thin rods (laminaria) inserted into your cervix.   A curved tool (curette) will scrape cells from the inside lining of the uterus and will then be removed.  This procedure usually takes about 15 to 30 minutes. AFTER THE PROCEDURE   You will rest in the recovery area until you are stable and are ready to go home.   You will need to have someone take you home.   You may feel sick to your stomach (nauseous) or throw up (vomit) if you had general anesthesia.   You may have a sore throat if a tube was placed in your throat during general anesthesia.   You may have light cramping and bleeding for 2 days to 2 weeks after the procedure.   Your uterus needs to make a new lining after the procedure. This may make your next period late.  Document Released: 08/21/2005 Document Revised: 05/03/2011 Document Reviewed: 03/19/2009 [facility] Patient Information 2012 [place], [place].    Endometrial Ablation Endometrial ablation removes the lining of the uterus (endometrium). It is usually a same day, outpatient treatment. Ablation helps avoid major surgery (such as a hysterectomy). A hysterectomy is removal of the cervix and uterus. Endometrial ablation has less risk and complications, has a shorter recovery period and is less expensive. After endometrial ablation, most women will have little or no menstrual bleeding. You may not keep your fertility. Pregnancy is no longer likely after this procedure but if you are pre-menopausal, you still need to use a reliable method of birth control following the procedure because pregnancy can occur. REASONS TO HAVE THE PROCEDURE MAY INCLUDE:  Heavy periods.   Bleeding that is causing anemia.   Anovulatory  bleeding, very irregular, bleeding.   Bleeding submucous fibroids (on the lining inside the uterus) if they are smaller than 3 centimeters.  REASONS NOT TO HAVE THE PROCEDURE MAY INCLUDE:  You wish to have more children.   You have a pre-cancerous or cancerous problem. The cause of any abnormal bleeding must be diagnosed before having the procedure.   You have pain coming from the uterus.   You have a submucus fibroid larger than 3 centimeters.   You recently had a baby.   You recently had an infection in the uterus.   You have a severe retro-flexed, tipped uterus and cannot insert the instrument to do the ablation.   You had a Cesarean section or deep major surgery on the uterus.   The inner cavity of the uterus is too large for the endometrial ablation instrument.  RISKS AND COMPLICATIONS   Perforation of the uterus.   Bleeding.   Infection of the uterus, bladder or vagina.   Injury to surrounding organs.   Cutting the cervix.   An air bubble to the lung (air embolus).   Pregnancy following the procedure.  Failure of the procedure to help the problem requiring hysterectomy.   Decreased ability to diagnose cancer in the lining of the uterus.  BEFORE THE PROCEDURE  The lining of the uterus must be tested to make sure there is no pre-cancerous or cancer cells present.   Medications may be given to make the lining of the uterus thinner.   Ultrasound may be used to evaluate the size and look for abnormalities of the uterus.   Future pregnancy is not desired.  PROCEDURE  There are different ways to destroy the lining of the uterus.   Resectoscope - radio frequency-alternating electric current is the most common one used.   Cryotherapy - freezing the lining of the uterus.   Heated Free Liquid - heated salt (saline) solution inserted into the uterus.   Microwave - uses high energy microwaves in the uterus.   Thermal Balloon - a catheter with a balloon tip is  inserted into the uterus and filled with heated fluid.  Your caregiver will talk with you about the method used in this clinic. They will also instruct you on the pros and cons of the procedure. Endometrial ablation is performed along with a procedure called operative hysteroscopy. A narrow viewing tube is inserted through the birth canal (vagina) and through the cervix into the uterus. A tiny camera attached to the viewing tube (hysteroscope) allows the uterine cavity to be shown on a TV monitor during surgery. Your uterus is filled with a harmless liquid to make the procedure easier. The lining of the uterus is then removed. The lining can also be removed with a resectoscope which allows your surgeon to cut away the lining of the uterus under direct vision. Usually, you will be able to go home within an hour after the procedure. HOME CARE INSTRUCTIONS   Do not drive for 24 hours.   No tampons, douching or intercourse for 2 weeks or until your caregiver approves.   Rest at home for 24 to 48 hours. You may then resume normal activities unless told differently by your caregiver.   Take your temperature two times a day for 4 days, and record it.   Take any medications your caregiver has ordered, as directed.   Use some form of contraception if you are pre-menopausal and do not want to get pregnant.  Bleeding after the procedure is normal. It varies from light spotting and mildly watery to bloody discharge for 4 to 6 weeks. You may also have mild cramping. Only take over-the-counter or prescription medicines for pain, discomfort, or fever as directed by your caregiver. Do not use aspirin, as this may aggravate bleeding. Frequent urination during the first 24 hours is normal. You will not know how effective your surgery is until at least 3 months after the surgery. SEEK IMMEDIATE MEDICAL CARE IF:   Bleeding is heavier than a normal menstrual cycle.   An oral temperature above 102 F (38.9 C)  develops.   You have increasing cramps or pains not relieved with medication or develop belly (abdominal) pain which does not seem to be related to the same area of earlier cramping and pain.   You are light headed, weak or have fainting episodes.   You develop pain in the shoulder strap areas.   You have chest or leg pain.   You have abnormal vaginal discharge.   You have painful urination.  Document Released: 06/30/2004 Document Revised: 05/03/2011 Document Reviewed: 09/28/2007 [facility] Patient Information 2012 [place], [place].  PATIENT INSTRUCTIONS POST-ANESTHESIA  IMMEDIATELY FOLLOWING SURGERY:  Do not drive or operate machinery for the first twenty four hours after surgery.  Do not make any important decisions for twenty four hours after surgery or while taking narcotic pain medications or sedatives.  If you develop intractable nausea and vomiting or a severe headache please notify your doctor immediately.  FOLLOW-UP:  Please make an appointment with your surgeon as instructed. You do not need to follow up with anesthesia unless specifically instructed to do so.  WOUND CARE INSTRUCTIONS (if applicable):  Keep a dry clean dressing on the anesthesia/puncture wound site if there is drainage.  Once the wound has quit draining you may leave it open to air.  Generally you should leave the bandage intact for twenty four hours unless there is drainage.  If the epidural site drains for more than 36-48 hours please call the anesthesia department.  QUESTIONS?:  Please feel free to call your physician or the hospital operator if you have any questions, and they will be happy to assist you.     [facility] Anesthesia Department [address] [place] [phone]

## 2011-08-15 NOTE — H&P (Signed)
Pamela Mendez is an 45 y.o. female. G 3 P 3 with LMP 06/01/2011 on Megace since then with increasingly heavy menstrual periods over the last 18 months, especially the last 4 months.  Sonogram in the office was normal except for a few small myomas.  Ovaries normal and all of patient's symptoms isolated to menses, no dyspareunia.  Positive reaponse to megestrol.    Past Medical History  Diagnosis Date  . Hypertension   . CHF (congestive heart failure)   . Anxiety and depression   . Cardiomyopathy     mild  . Abdominal pain     Unspecified site  . Hyperlipidemia   . Anxiety   . Depression   . GERD (gastroesophageal reflux disease)     Past Surgical History  Procedure Date  . Lumbar disc surgery 2010    [place], Dr Marikay Alar  . Childbirth     time 3  . Cholecystectomy 2005    [facility] , Dr Katrinka Blazing  . Lumbar disc surgery 2005    , Dr Marikay Alar    Family History  Problem Relation Age of Onset  . Heart attack Father   . Anesthesia problems Neg Hx   . Hypotension Neg Hx   . Malignant hyperthermia Neg Hx   . Pseudochol deficiency Neg Hx     Social History:  reports that she has been smoking Cigarettes.  She has a 3.75 pack-year smoking history. She does not have any smokeless tobacco history on file. She reports that she does not drink alcohol or use illicit drugs.  Allergies:  Allergies  Allergen Reactions  . Sulfa Antibiotics Anaphylaxis and Swelling    No prescriptions prior to admission    ROS  Review of Systems  Constitutional: Negative for fever, chills, weight loss, malaise/fatigue and diaphoresis.  HENT: Negative for hearing loss, ear pain, nosebleeds, congestion, sore throat, neck pain, tinnitus and ear discharge.   Eyes: Negative for blurred vision, double vision, photophobia, pain, discharge and redness.  Respiratory: Negative for cough, hemoptysis, sputum production, shortness of breath, wheezing and stridor.   Cardiovascular: Negative for  chest pain, palpitations, orthopnea, claudication, leg swelling and PND.  Gastrointestinal: Negative for abdominal pain. Negative for heartburn, nausea, vomiting, diarrhea, constipation, blood in stool and melena.  Genitourinary: Negative for dysuria, urgency, frequency, hematuria and flank pain.  Musculoskeletal: Negative for myalgias, back pain, joint pain and falls.  Skin: Negative for itching and rash.  Neurological: Negative for dizziness, tingling, tremors, sensory change, speech change, focal weakness, seizures, loss of consciousness, weakness and headaches.  Endo/Heme/Allergies: Negative for environmental allergies and polydipsia. Does not bruise/bleed easily.  Psychiatric/Behavioral: Negative for depression, suicidal ideas, hallucinations, memory loss and substance abuse. The patient is not nervous/anxious and does not have insomnia.       Physical Exam Physical Exam  Vitals reviewed. Constitutional: She is oriented to person, place, and time. She appears well-developed and well-nourished.  HENT:  Head: Normocephalic and atraumatic.  Right Ear: External ear normal.  Left Ear: External ear normal.  Nose: Nose normal.  Mouth/Throat: Oropharynx is clear and moist.  Eyes: Conjunctivae and EOM are normal. Pupils are equal, round, and reactive to light. Right eye exhibits no discharge. Left eye exhibits no discharge. No scleral icterus.  Neck: Normal range of motion. Neck supple. No tracheal deviation present. No thyromegaly present.  Cardiovascular: Normal rate, regular rhythm, normal heart sounds and intact distal pulses.  Exam reveals no gallop and no friction rub.  No murmur heard. Respiratory: Effort normal and breath sounds normal. No respiratory distress. She has no wheezes. She has no rales. She exhibits no tenderness.  GI: Soft. Bowel sounds are normal. She exhibits no distension and no mass. There is no tenderness. There is no rebound and no guarding.  Genitourinary:        Vulva is normal without lesions Vagina is pink moist without discharge Cervix normal in appearance and pap is normal Uterus is normal sized with small fibroids less than 2 cm Adnexa is negative with normal sized ovaries by sonogram  Musculoskeletal: Normal range of motion. She exhibits no edema and no tenderness.  Neurological: She is alert and oriented to person, place, and time. She has normal reflexes. She displays normal reflexes. No cranial nerve deficit. She exhibits normal muscle tone. Coordination normal.  Skin: Skin is warm and dry. No rash noted. No erythema. No pallor.  Psychiatric: She has a normal mood and affect. Her behavior is normal. Judgment and thought content normal.         Assessment/Plan Menometrorrhagia, dysmenorrhea  Patient to have hysteroscopy uterine currettage and endometrial ablation.  Patient understands the risks of treatment failure.  Pamela Mendez H 08/15/2011, 9:55 PM

## 2011-08-16 ENCOUNTER — Encounter (HOSPITAL_COMMUNITY)
Admission: RE | Disposition: A | Discharge status: Home / Self Care | Payer: Self-pay | Source: Ambulatory Visit | Attending: Obstetrics & Gynecology

## 2011-08-16 ENCOUNTER — Ambulatory Visit (HOSPITAL_COMMUNITY)
Admission: RE | Admit: 2011-08-16 | Discharge: 2011-08-16 | Disposition: A | Discharge status: Home / Self Care | Payer: Medicaid Other | Source: Ambulatory Visit | Attending: Obstetrics & Gynecology | Admitting: Obstetrics & Gynecology

## 2011-08-16 ENCOUNTER — Other Ambulatory Visit: Payer: Self-pay | Admitting: Obstetrics & Gynecology

## 2011-08-16 ENCOUNTER — Encounter (HOSPITAL_COMMUNITY): Payer: Self-pay | Admitting: *Deleted

## 2011-08-16 ENCOUNTER — Encounter (HOSPITAL_COMMUNITY): Payer: Self-pay | Admitting: Anesthesiology

## 2011-08-16 DIAGNOSIS — N920 Excessive and frequent menstruation with regular cycle: Secondary | ICD-10-CM | POA: Insufficient documentation

## 2011-08-16 DIAGNOSIS — Z79899 Other long term (current) drug therapy: Secondary | ICD-10-CM | POA: Insufficient documentation

## 2011-08-16 DIAGNOSIS — I10 Essential (primary) hypertension: Secondary | ICD-10-CM | POA: Insufficient documentation

## 2011-08-16 DIAGNOSIS — Z01812 Encounter for preprocedural laboratory examination: Secondary | ICD-10-CM | POA: Insufficient documentation

## 2011-08-16 DIAGNOSIS — Z01810 Encounter for preprocedural cardiovascular examination: Secondary | ICD-10-CM | POA: Insufficient documentation

## 2011-08-16 DIAGNOSIS — N946 Dysmenorrhea, unspecified: Secondary | ICD-10-CM | POA: Insufficient documentation

## 2011-08-16 DIAGNOSIS — Z9889 Other specified postprocedural states: Secondary | ICD-10-CM

## 2011-08-16 SURGERY — DILATATION & CURETTAGE/HYSTEROSCOPY WITH THERMACHOICE ABLATION
Anesthesia: General | Site: Uterus | Wound class: Clean Contaminated

## 2011-08-16 MED ORDER — ONDANSETRON HCL 4 MG/2ML IJ SOLN
INTRAMUSCULAR | Status: AC
  Administered 2011-08-16: 4 mg via INTRAVENOUS
  Filled 2011-08-16: qty 2

## 2011-08-16 MED ORDER — PROPOFOL 10 MG/ML IV BOLUS
INTRAVENOUS | Status: DC | PRN
  Administered 2011-08-16: 150 mg via INTRAVENOUS

## 2011-08-16 MED ORDER — GLYCOPYRROLATE 0.2 MG/ML IJ SOLN: 0.2000 mg | Freq: Once | INTRAMUSCULAR | Status: DC | PRN

## 2011-08-16 MED ORDER — CEFAZOLIN SODIUM 1-5 GM-% IV SOLN: 1.0000 g | INTRAVENOUS | Status: DC

## 2011-08-16 MED ORDER — KETOROLAC TROMETHAMINE 30 MG/ML IJ SOLN
30.0000 mg | Freq: Once | INTRAMUSCULAR | Status: AC
  Administered 2011-08-16: 30 mg via INTRAVENOUS

## 2011-08-16 MED ORDER — LACTATED RINGERS IV SOLN
INTRAVENOUS | Status: DC
  Administered 2011-08-16: 1000 mL via INTRAVENOUS

## 2011-08-16 MED ORDER — CEFAZOLIN SODIUM 1-5 GM-% IV SOLN
INTRAVENOUS | Status: DC | PRN
  Administered 2011-08-16: 1 g via INTRAVENOUS

## 2011-08-16 MED ORDER — CEFAZOLIN SODIUM 1-5 GM-% IV SOLN
INTRAVENOUS | Status: AC
  Filled 2011-08-16: qty 50

## 2011-08-16 MED ORDER — GLYCOPYRROLATE 0.2 MG/ML IJ SOLN
INTRAMUSCULAR | Status: AC
  Administered 2011-08-16: 0.2 mg via INTRAVENOUS
  Filled 2011-08-16: qty 1

## 2011-08-16 MED ORDER — LIDOCAINE HCL (PF) 1 % IJ SOLN
INTRAMUSCULAR | Status: AC
  Filled 2011-08-16: qty 5

## 2011-08-16 MED ORDER — MIDAZOLAM HCL 2 MG/2ML IJ SOLN
1.0000 mg | INTRAMUSCULAR | Status: DC | PRN
  Administered 2011-08-16: 2 mg via INTRAVENOUS

## 2011-08-16 MED ORDER — PROPOFOL 10 MG/ML IV EMUL
INTRAVENOUS | Status: AC
  Filled 2011-08-16: qty 20

## 2011-08-16 MED ORDER — FENTANYL CITRATE 0.05 MG/ML IJ SOLN
INTRAMUSCULAR | Status: AC
  Filled 2011-08-16: qty 2

## 2011-08-16 MED ORDER — ONDANSETRON HCL 4 MG/2ML IJ SOLN: 4.0000 mg | Freq: Once | INTRAMUSCULAR | Status: DC | PRN

## 2011-08-16 MED ORDER — MIDAZOLAM HCL 2 MG/2ML IJ SOLN
INTRAMUSCULAR | Status: AC
  Administered 2011-08-16: 2 mg via INTRAVENOUS
  Filled 2011-08-16: qty 2

## 2011-08-16 MED ORDER — GLYCOPYRROLATE 0.2 MG/ML IJ SOLN
0.2000 mg | Freq: Once | INTRAMUSCULAR | Status: AC | PRN
  Administered 2011-08-16: 0.2 mg via INTRAVENOUS

## 2011-08-16 MED ORDER — KETOROLAC TROMETHAMINE 10 MG PO TABS
10.0000 mg | ORAL_TABLET | Freq: Three times a day (TID) | ORAL | Status: AC | PRN
Start: 2011-08-16 — End: 2011-08-21

## 2011-08-16 MED ORDER — SODIUM CHLORIDE 0.9 % IR SOLN
Status: DC | PRN
  Administered 2011-08-16: 3000 mL

## 2011-08-16 MED ORDER — ONDANSETRON HCL 8 MG PO TABS: 8.0000 mg | ORAL_TABLET | Freq: Three times a day (TID) | ORAL | Status: AC | PRN

## 2011-08-16 MED ORDER — HYDROCODONE-ACETAMINOPHEN 5-500 MG PO TABS: 1.0000 | ORAL_TABLET | Freq: Four times a day (QID) | ORAL | Status: AC | PRN

## 2011-08-16 MED ORDER — FENTANYL CITRATE 0.05 MG/ML IJ SOLN
INTRAMUSCULAR | Status: DC | PRN
  Administered 2011-08-16: 25 ug via INTRAVENOUS
  Administered 2011-08-16 (×2): 50 ug via INTRAVENOUS

## 2011-08-16 MED ORDER — KETOROLAC TROMETHAMINE 30 MG/ML IJ SOLN
INTRAMUSCULAR | Status: AC
  Administered 2011-08-16: 30 mg via INTRAVENOUS
  Filled 2011-08-16: qty 1

## 2011-08-16 MED ORDER — LIDOCAINE HCL 1 % IJ SOLN
INTRAMUSCULAR | Status: DC | PRN
  Administered 2011-08-16: 40 mg via INTRADERMAL

## 2011-08-16 MED ORDER — FENTANYL CITRATE 0.05 MG/ML IJ SOLN: 25.0000 ug | INTRAMUSCULAR | Status: DC | PRN

## 2011-08-16 MED ORDER — DEXTROSE 5 % IV SOLN
INTRAVENOUS | Status: DC | PRN
  Administered 2011-08-16: 500 mL via INTRAVENOUS

## 2011-08-16 MED ORDER — ONDANSETRON HCL 4 MG/2ML IJ SOLN
4.0000 mg | Freq: Once | INTRAMUSCULAR | Status: AC
  Administered 2011-08-16: 4 mg via INTRAVENOUS

## 2011-08-16 SURGICAL SUPPLY — 30 items
BAG DECANTER FOR FLEXI CONT (MISCELLANEOUS) ×2 IMPLANT
BAG HAMPER (MISCELLANEOUS) ×2 IMPLANT
CATH THERMACHOICE III (CATHETERS) ×2 IMPLANT
CLOTH BEACON ORANGE TIMEOUT ST (SAFETY) ×2 IMPLANT
COVER LIGHT HANDLE STERIS (MISCELLANEOUS) ×4 IMPLANT
FORMALIN 10 PREFIL 120ML (MISCELLANEOUS) ×2 IMPLANT
GAUZE SPONGE 4X4 16PLY XRAY LF (GAUZE/BANDAGES/DRESSINGS) ×2 IMPLANT
GLOVE BIOGEL PI IND STRL 8 (GLOVE) ×1 IMPLANT
GLOVE BIOGEL PI INDICATOR 8 (GLOVE) ×1
GLOVE ECLIPSE 6.5 STRL STRAW (GLOVE) ×1 IMPLANT
GLOVE ECLIPSE 8.0 STRL XLNG CF (GLOVE) ×2 IMPLANT
GLOVE EXAM NITRILE MD LF STRL (GLOVE) ×1 IMPLANT
GLOVE INDICATOR 7.0 STRL GRN (GLOVE) ×1 IMPLANT
GOWN STRL REIN XL XLG (GOWN DISPOSABLE) ×4 IMPLANT
INST SET HYSTEROSCOPY (KITS) ×2 IMPLANT
IV D5W 500ML (IV SOLUTION) ×2 IMPLANT
IV NS IRRIG 3000ML ARTHROMATIC (IV SOLUTION) ×2 IMPLANT
KIT ROOM TURNOVER APOR (KITS) ×2 IMPLANT
MANIFOLD NEPTUNE II (INSTRUMENTS) ×2 IMPLANT
MARKER SKIN DUAL TIP RULER LAB (MISCELLANEOUS) ×2 IMPLANT
NS IRRIG 1000ML POUR BTL (IV SOLUTION) ×2 IMPLANT
PACK BASIC III (CUSTOM PROCEDURE TRAY) ×2
PACK SRG BSC III STRL LF ECLPS (CUSTOM PROCEDURE TRAY) ×1 IMPLANT
PAD ARMBOARD 7.5X6 YLW CONV (MISCELLANEOUS) ×2 IMPLANT
PAD TELFA 3X4 1S STER (GAUZE/BANDAGES/DRESSINGS) ×2 IMPLANT
SET BASIN LINEN APH (SET/KITS/TRAYS/PACK) ×2 IMPLANT
SET IRRIG Y TYPE TUR BLADDER L (SET/KITS/TRAYS/PACK) ×2 IMPLANT
SHEET LAVH (DRAPES) ×2 IMPLANT
SYR 30ML LL (SYRINGE) ×1 IMPLANT
YANKAUER SUCT BULB TIP 10FT TU (MISCELLANEOUS) ×2 IMPLANT

## 2011-08-16 NOTE — Interval H&P Note (Signed)
History and Physical Interval Note:  08/16/2011 7:24 AM  Pamela Mendez  has presented today for surgery, with the diagnosis of menometrorrhagia dysmenorrhea  The various methods of treatment have been discussed with the patient and family. After consideration of risks, benefits and other options for treatment, the patient has consented to  Procedure(s): DILATATION & CURETTAGE/HYSTEROSCOPY WITH THERMACHOICE ABLATION as a surgical intervention .  The patients' history has been reviewed, patient examined, no change in status, stable for surgery.  I have reviewed the patients' chart and labs.  Questions were answered to the patient's satisfaction.     [name] H

## 2011-08-16 NOTE — Transfer of Care (Signed)
Immediate Anesthesia Transfer of Care Note  Patient: Pamela Mendez  Procedure(s) Performed:  DILATATION & CURETTAGE/HYSTEROSCOPY WITH THERMACHOICE ABLATION - For Ablation Thermachoice Temperature:87C Fluid In: 57ml Fluid Out:35ml Total therapy time: 18.87min   Patient Location: PACU  Anesthesia Type: General  Level of Consciousness: sedated  Airway & Oxygen Therapy: Patient Spontanous Breathing and Patient connected to face mask oxygen  Post-op Assessment: Report given to PACU RN  Post vital signs: Reviewed and stable  Complications: No apparent anesthesia complications

## 2011-08-16 NOTE — Anesthesia Procedure Notes (Addendum)
Performed by: Glynn Octave   Procedure Name: LMA Insertion Date/Time: 08/16/2011 7:42 AM Performed by: Glynn Octave Pre-anesthesia Checklist: Patient identified, Patient being monitored, Emergency Drugs available, Timeout performed and Suction available Patient Re-evaluated:Patient Re-evaluated prior to inductionOxygen Delivery Method: Circle System Utilized Preoxygenation: Pre-oxygenation with 100% oxygen Intubation Type: IV induction Ventilation: Mask ventilation without difficulty LMA: LMA inserted LMA Size: 4.0 Number of attempts: 1 Placement Confirmation: positive ETCO2 and breath sounds checked- equal and bilateral

## 2011-08-16 NOTE — Anesthesia Postprocedure Evaluation (Signed)
  Anesthesia Post-op Note  Patient: Pamela Mendez  Procedure(s) Performed:  DILATATION & CURETTAGE/HYSTEROSCOPY WITH THERMACHOICE ABLATION - For Ablation Thermachoice Temperature:87C Fluid In: 57ml Fluid Out:92ml Total therapy time: 18.80min   Patient Location: PACU  Anesthesia Type: General  Level of Consciousness: awake  Airway and Oxygen Therapy: Patient Spontanous Breathing and Patient connected to face mask oxygen  Post-op Pain: none  Post-op Assessment: Post-op Vital signs reviewed, Patient's Cardiovascular Status Stable, Respiratory Function Stable and No signs of Nausea or vomiting  Post-op Vital Signs: Reviewed and stable  Complications: No apparent anesthesia complications

## 2011-08-16 NOTE — Op Note (Signed)
Preoperative diagnosis: Menometrorrhagia                                        Dysmenorrhea   Postoperative diagnoses: Same as above   Procedure: Hysteroscopy, uterine curettage, endometrial ablation  Surgeon: Despina Hidden MD  Anesthesia: Laryngeal mask airway  Findings: The endometrium was normal. There were no fibroid or other abnormalities.  Description of operation: The patient was taken to the operating room and placed in the supine position. She underwent general anesthesia using the laryngeal mask airway. She was placed in the dorsal lithotomy position and prepped and draped in the usual sterile fashion. A Graves speculum was placed and the anterior cervical lip was grasped with a single-tooth tenaculum. The cervix was dilated serially to allow passage of the hysteroscope. Diagnostic hysteroscopy was performed and was found to be normal. A vigorous uterine curettage was then performed and all tissue sent to pathology for evaluation. The ThermaChoice 3 endometrial ablation balloon was then used were 57 cc of D5W was required to maintain a pressure of 190-200 mm of mercury throughout the procedure.  Total therapy time was 18:32.  All of the equipment worked well throughout the procedure. All of the fluid was returned at the end of the procedure. The patient was awakened from anesthesia and taken to the recovery room in good stable condition all counts were correct. She received 1 g of Ancef and 30 mg of Toradol preoperatively. She will be discharged from the recovery room and followed up in the office next week.  Lazaro Arms 8:37 AM 08/16/2011

## 2011-08-16 NOTE — Anesthesia Preprocedure Evaluation (Signed)
Anesthesia Evaluation  Patient identified by MRN, date of birth, ID band Patient awake    Reviewed: Allergy & Precautions, H&P , NPO status , Patient's Chart, lab work & pertinent test results, reviewed documented beta blocker date and time   History of Anesthesia Complications Negative for: history of anesthetic complications  Airway Mallampati: III TM Distance: >3 FB     Dental  (+) Teeth Intact   Pulmonary Current Smoker,    Pulmonary exam normal       Cardiovascular hypertension, Pt. on medications +CHF (resolved) Regular Normal    Neuro/Psych PSYCHIATRIC DISORDERS Anxiety Depression    GI/Hepatic GERD-  Medicated and Controlled,  Endo/Other    Renal/GU      Musculoskeletal   Abdominal   Peds  Hematology   Anesthesia Other Findings   Reproductive/Obstetrics                           Anesthesia Physical Anesthesia Plan  ASA: III  Anesthesia Plan: General   Post-op Pain Management:    Induction: Intravenous  Airway Management Planned: LMA  Additional Equipment:   Intra-op Plan:   Post-operative Plan: Extubation in OR  Informed Consent: I have reviewed the patients History and Physical, chart, labs and discussed the procedure including the risks, benefits and alternatives for the proposed anesthesia with the patient or authorized representative who has indicated his/her understanding and acceptance.     Plan Discussed with:   Anesthesia Plan Comments:         Anesthesia Quick Evaluation

## 2011-12-05 ENCOUNTER — Other Ambulatory Visit: Payer: Self-pay | Admitting: Cardiovascular Disease

## 2012-05-31 ENCOUNTER — Other Ambulatory Visit: Payer: Self-pay | Admitting: Family Medicine

## 2012-05-31 ENCOUNTER — Ambulatory Visit (HOSPITAL_COMMUNITY)
Admission: RE | Admit: 2012-05-31 | Discharge: 2012-05-31 | Disposition: A | Discharge status: Home / Self Care | Payer: Medicaid Other | Source: Ambulatory Visit | Attending: Family Medicine | Admitting: Family Medicine

## 2012-05-31 DIAGNOSIS — M25579 Pain in unspecified ankle and joints of unspecified foot: Secondary | ICD-10-CM | POA: Insufficient documentation

## 2012-05-31 DIAGNOSIS — M79671 Pain in right foot: Secondary | ICD-10-CM

## 2012-12-30 ENCOUNTER — Encounter: Payer: Self-pay | Admitting: *Deleted

## 2012-12-31 ENCOUNTER — Ambulatory Visit (INDEPENDENT_AMBULATORY_CARE_PROVIDER_SITE_OTHER): Payer: Medicaid Other | Admitting: Family Medicine

## 2012-12-31 ENCOUNTER — Encounter: Payer: Self-pay | Admitting: Family Medicine

## 2012-12-31 VITALS — BP 116/84 | Temp 98.0°F | Wt 201.2 lb

## 2012-12-31 DIAGNOSIS — J309 Allergic rhinitis, unspecified: Secondary | ICD-10-CM | POA: Insufficient documentation

## 2012-12-31 MED ORDER — METHYLPREDNISOLONE ACETATE 40 MG/ML IJ SUSP
40.0000 mg | Freq: Once | INTRAMUSCULAR | Status: AC
  Administered 2012-12-31: 40 mg via INTRAMUSCULAR

## 2012-12-31 MED ORDER — AZITHROMYCIN 250 MG PO TABS: ORAL_TABLET | ORAL | Status: DC

## 2012-12-31 MED ORDER — FLUTICASONE PROPIONATE 50 MCG/ACT NA SUSP: 2.0000 | Freq: Every day | NASAL | Status: DC

## 2012-12-31 NOTE — Progress Notes (Signed)
  Subjective:    Patient ID: Pamela Mendez, female    DOB: 13-Apr-1966, 47 y.o.   MRN: [id number]  Sinusitis This is a new problem. The current episode started in the past 7 days. The problem is unchanged. There has been no fever. Her pain is at a severity of 8/10. The pain is moderate. Associated symptoms include congestion, coughing, headaches and sinus pressure. Treatments tried: tylenol sinus. The treatment provided no relief.      Review of Systems  HENT: Positive for congestion and sinus pressure.   Respiratory: Positive for cough.   Neurological: Positive for headaches.       Objective:   Physical Exam Eardrums normal throat is normal mild sinus tenderness neck is supple no masses lungs are clear no crackles extremities no edema skin warm dry       Assessment & Plan:  Sinusitis along with allergic rhinitis-antibiotics Z-Pak prescribed, Depo-Medrol shot, Flonase nasal spray, superior seen daily. If progressive symptoms or if worse followup.

## 2012-12-31 NOTE — Patient Instructions (Signed)
It is very important to use your Strattera seen every day. In addition to this he uses CPAP over the next 5 days if ongoing trouble let us know. Flonase 2 sprays each nostril every single day for the next 6 weeks. You should gradually feel better over the next several days.  Remember to schedule for blood work as well as a comprehensive checkup on for sometime in June.

## 2013-01-02 ENCOUNTER — Other Ambulatory Visit: Payer: Self-pay | Admitting: Family Medicine

## 2013-01-02 NOTE — Telephone Encounter (Signed)
meds called in.

## 2013-01-02 NOTE — Telephone Encounter (Signed)
App. Tomorrow 01/03/13

## 2013-01-02 NOTE — Telephone Encounter (Signed)
protonix refill times 6 Hydrocodone refille times 3 total

## 2013-01-06 ENCOUNTER — Other Ambulatory Visit: Payer: Self-pay | Admitting: *Deleted

## 2013-01-06 MED ORDER — SIMVASTATIN 20 MG PO TABS: 20.0000 mg | ORAL_TABLET | Freq: Every day | ORAL | Status: DC

## 2013-01-17 ENCOUNTER — Telehealth: Payer: Self-pay | Admitting: Family Medicine

## 2013-01-17 NOTE — Telephone Encounter (Signed)
Patient was seen 4/28 and given Zpack but still sick. She wants another round called into [facility] [place].

## 2013-01-17 NOTE — Telephone Encounter (Signed)
Sinus congestion, hurting under eyes, yellow/green sinus drainage, no fever, some wheezing can something be called [facility] [place].

## 2013-01-17 NOTE — Telephone Encounter (Signed)
Rx called into pharm pt notified

## 2013-01-17 NOTE — Telephone Encounter (Signed)
Clindamycin 300 one tid for 7 days

## 2013-02-03 ENCOUNTER — Ambulatory Visit (INDEPENDENT_AMBULATORY_CARE_PROVIDER_SITE_OTHER): Payer: Medicaid Other | Admitting: Nurse Practitioner

## 2013-02-03 ENCOUNTER — Encounter: Payer: Self-pay | Admitting: Nurse Practitioner

## 2013-02-03 VITALS — BP 110/80 | HR 70 | Ht 67.0 in | Wt 197.0 lb

## 2013-02-03 DIAGNOSIS — M25561 Pain in right knee: Secondary | ICD-10-CM

## 2013-02-03 DIAGNOSIS — M25569 Pain in unspecified knee: Secondary | ICD-10-CM

## 2013-02-03 MED ORDER — NAPROXEN 500 MG PO TABS: 500.0000 mg | ORAL_TABLET | Freq: Two times a day (BID) | ORAL | Status: DC

## 2013-02-03 NOTE — Progress Notes (Signed)
Subjective:  Presents for complaints of right knee pain over the past month. No specific history of injury. Had a similar problem about 10 years ago, was seen in Dr. Sanjuan Dame office for an injection. History of chronic back pain with some numbness occasionally in the calf area. Slight relief with heating pad. Minimal relief with ibuprofen. Pain worse with flexion such as going up and down steps and squatting. Describes a burning pain in the anterior lateral part of the right knee. Has had swelling a couple of times. No erythema. Slight warmth.  Objective:   BP 110/80  Pulse 70  Ht 5\' 7"  (1.702 m)  Wt 197 lb (89.359 kg)  BMI 30.85 kg/m2  LMP 06/29/2011 NAD. Alert, oriented. Good ROM of the right knee with tenderness with full flexion and extension. No joint laxity. Distinct crepitus noted in the right knee, none in the left. Distinct tenderness noted along the anterior lateral part of the knee. No popliteal masses or tenderness. Gait slow but steady. No edema erythema or warmth.  Assessment:Knee pain, acute, right - Plan: Ambulatory referral to Orthopedic Surgery  Plan: Stop ibuprofen. Switch to naproxen as directed. Patient already has pain medication that she uses for her back. Refer to orthopedic Dr. for further evaluation and treatment. Call back sooner if any problems.

## 2013-04-09 ENCOUNTER — Other Ambulatory Visit: Payer: Self-pay | Admitting: Family Medicine

## 2013-04-09 NOTE — Telephone Encounter (Signed)
Refill times one, needs pain management f/u ov by end of this scripot

## 2013-05-06 ENCOUNTER — Other Ambulatory Visit: Payer: Self-pay | Admitting: Family Medicine

## 2013-05-06 NOTE — Telephone Encounter (Signed)
She needs to office visit before refills on control medicine. May refill cetirizine x6

## 2013-05-13 ENCOUNTER — Encounter: Payer: Self-pay | Admitting: Family Medicine

## 2013-05-13 ENCOUNTER — Ambulatory Visit (INDEPENDENT_AMBULATORY_CARE_PROVIDER_SITE_OTHER): Payer: Medicaid Other | Admitting: Family Medicine

## 2013-05-13 VITALS — BP 118/78 | Ht 66.0 in | Wt 195.0 lb

## 2013-05-13 DIAGNOSIS — I10 Essential (primary) hypertension: Secondary | ICD-10-CM

## 2013-05-13 DIAGNOSIS — Z720 Tobacco use: Secondary | ICD-10-CM

## 2013-05-13 DIAGNOSIS — G8929 Other chronic pain: Secondary | ICD-10-CM

## 2013-05-13 DIAGNOSIS — M549 Dorsalgia, unspecified: Secondary | ICD-10-CM

## 2013-05-13 DIAGNOSIS — F17200 Nicotine dependence, unspecified, uncomplicated: Secondary | ICD-10-CM

## 2013-05-13 DIAGNOSIS — E782 Mixed hyperlipidemia: Secondary | ICD-10-CM

## 2013-05-13 MED ORDER — TEMAZEPAM 15 MG PO CAPS: 15.0000 mg | ORAL_CAPSULE | Freq: Every evening | ORAL | Status: DC | PRN

## 2013-05-13 MED ORDER — CETIRIZINE HCL 10 MG PO TABS: ORAL_TABLET | ORAL | Status: DC

## 2013-05-13 MED ORDER — HYDROCODONE-ACETAMINOPHEN 5-325 MG PO TABS: ORAL_TABLET | ORAL | Status: DC

## 2013-05-13 NOTE — Progress Notes (Signed)
  Subjective:    Patient ID: Pamela Mendez, female    DOB: 10-Sep-1965, 47 y.o.   MRN: [id number]  HPI Patient arrives for a follow up on pain management. Having sone pains going down her legs but doesn't think she needs to see neurosurgeon yet. Patient relates pain medication helps her function does not cause drowsiness she denies abusing it denies abusing other drugs She recently went through separation. She states it's cause some stress between her and her older daughter but she is currently taking care of younger daughter without any troubles. She denies being depressed she states her anxiety levels are stable She does try to eat a heart healthy diet she does try to exercise. She denies angina chest pain shortness of breath denies rectal bleeding She does relate compliance with her medications and needs refills today Family history noncontributory Review of Systems See above Patient does smoke but currently using electronic cigarettes    Objective:   Physical Exam  Lungs are clear hearts regular pulse normal blood pressure good abdomen soft low back subjective tenderness negative straight leg raise extremities no edema      Assessment & Plan:  #1 chronic pain-medication prescribed followup in 3-4 months she has used hydrocodone no greater than 2 per day, #60, 3 refills #2 anxiety issues-patient states under less stress now that she's moved away from her husband #3 hyperlipidemia continue current medication continue medicines #4 hypertension good control continue meds

## 2013-05-14 LAB — HEPATIC FUNCTION PANEL
ALT: 10 U/L (ref 0–35)
AST: 11 U/L (ref 0–37)
Albumin: 3.8 g/dL (ref 3.5–5.2)
Alkaline Phosphatase: 61 U/L (ref 39–117)
Bilirubin, Direct: 0.1 mg/dL (ref 0.0–0.3)
Indirect Bilirubin: 0.2 mg/dL (ref 0.0–0.9)
Total Bilirubin: 0.3 mg/dL (ref 0.3–1.2)
Total Protein: 6.6 g/dL (ref 6.0–8.3)

## 2013-05-14 LAB — BASIC METABOLIC PANEL WITH GFR
BUN: 9 mg/dL (ref 6–23)
CO2: 28 meq/L (ref 19–32)
Calcium: 9.2 mg/dL (ref 8.4–10.5)
Chloride: 106 meq/L (ref 96–112)
Creat: 1.05 mg/dL (ref 0.50–1.10)
Glucose, Bld: 85 mg/dL (ref 70–99)
Potassium: 3.9 meq/L (ref 3.5–5.3)
Sodium: 140 meq/L (ref 135–145)

## 2013-05-14 LAB — LIPID PANEL: Cholesterol: 178 mg/dL (ref 0–200)

## 2013-05-28 NOTE — Addendum Note (Signed)
Addended by: Margaretha Sheffield on: 05/28/2013 04:01 PM   Modules accepted: Orders

## 2013-06-09 ENCOUNTER — Telehealth: Payer: Self-pay | Admitting: Family Medicine

## 2013-06-09 ENCOUNTER — Other Ambulatory Visit: Payer: Self-pay | Admitting: Family Medicine

## 2013-06-09 NOTE — Telephone Encounter (Signed)
She may have a prescription for this. She should then followup with [place] toward the end of this prescription for office visit and we will write 3 separate scripts at that time.

## 2013-06-09 NOTE — Telephone Encounter (Signed)
Patient needs a refill for HYDROcodone-acetaminophen (NORCO/VICODIN) 5-325 MG per tablet

## 2013-06-10 MED ORDER — HYDROCODONE-ACETAMINOPHEN 5-325 MG PO TABS: ORAL_TABLET | ORAL | Status: DC

## 2013-06-10 NOTE — Telephone Encounter (Signed)
Patient no longer lives at the home # listed in chart. Cell # is no valid. RX upfront ready for pickup.

## 2013-07-09 ENCOUNTER — Other Ambulatory Visit: Payer: Self-pay | Admitting: Family Medicine

## 2013-07-09 ENCOUNTER — Telehealth: Payer: Self-pay | Admitting: Family Medicine

## 2013-07-09 MED ORDER — HYDROCODONE-ACETAMINOPHEN 5-325 MG PO TABS: ORAL_TABLET | ORAL | Status: DC

## 2013-07-09 NOTE — Telephone Encounter (Signed)
Patient states she needs a refill on HYDROcodone-acetaminophen (NORCO/VICODIN) 5-325 MG per tablet   Please call patient

## 2013-07-09 NOTE — Telephone Encounter (Signed)
Script ready for pickup at front desk. Patient was notified and to schedule office visit.

## 2013-07-09 NOTE — Telephone Encounter (Signed)
One refill, OV in 4 weeks, schedule please

## 2013-07-09 NOTE — Telephone Encounter (Signed)
Last office visit 05-13-13

## 2013-07-10 ENCOUNTER — Other Ambulatory Visit: Payer: Self-pay

## 2013-07-15 ENCOUNTER — Other Ambulatory Visit: Payer: Self-pay | Admitting: Family Medicine

## 2013-07-18 ENCOUNTER — Other Ambulatory Visit: Payer: Self-pay | Admitting: *Deleted

## 2013-07-18 MED ORDER — POTASSIUM CHLORIDE 20 MEQ PO PACK: 40.0000 meq | PACK | Freq: Two times a day (BID) | ORAL | Status: DC

## 2013-08-07 ENCOUNTER — Ambulatory Visit (INDEPENDENT_AMBULATORY_CARE_PROVIDER_SITE_OTHER): Payer: Medicaid Other | Admitting: Family Medicine

## 2013-08-07 ENCOUNTER — Encounter: Payer: Self-pay | Admitting: Family Medicine

## 2013-08-07 VITALS — BP 116/74 | Temp 98.0°F | Ht 66.0 in | Wt 195.0 lb

## 2013-08-07 DIAGNOSIS — R609 Edema, unspecified: Secondary | ICD-10-CM

## 2013-08-07 DIAGNOSIS — I428 Other cardiomyopathies: Secondary | ICD-10-CM

## 2013-08-07 DIAGNOSIS — E785 Hyperlipidemia, unspecified: Secondary | ICD-10-CM

## 2013-08-07 DIAGNOSIS — G8929 Other chronic pain: Secondary | ICD-10-CM

## 2013-08-07 DIAGNOSIS — M549 Dorsalgia, unspecified: Secondary | ICD-10-CM

## 2013-08-07 MED ORDER — DOXYCYCLINE HYCLATE 100 MG PO CAPS: 100.0000 mg | ORAL_CAPSULE | Freq: Two times a day (BID) | ORAL | Status: DC

## 2013-08-07 MED ORDER — HYDROCODONE-ACETAMINOPHEN 5-325 MG PO TABS: ORAL_TABLET | ORAL | Status: DC

## 2013-08-07 MED ORDER — ASPIRIN EC 81 MG PO TBEC: 162.0000 mg | DELAYED_RELEASE_TABLET | Freq: Every day | ORAL | Status: AC

## 2013-08-07 MED ORDER — PREGABALIN 50 MG PO CAPS: 50.0000 mg | ORAL_CAPSULE | Freq: Two times a day (BID) | ORAL | Status: DC

## 2013-08-07 NOTE — Progress Notes (Signed)
   Subjective:    Patient ID: Pamela Mendez, female    DOB: 1966-02-28, 47 y.o.   MRN: [id number]  HPIFollow up on meds.   Having sinus pressure, headache, scratchy throat for the past couple of days. Taking mucinex. She denies any shortness of breath or wheezing with about relates a lot of head congestion sinus pressure drainage.  She has a history of cardiomyopathy takes a 325 mg aspirin for a period I advised her that she only really needs to take 162 mg aspirin. In addition to all of this she denies any chest tightness pressure pain shortness of breath.  Would like to discuss getting a prescription for chantix. Patient really wants to quit smoking we discussed at length the importance of quitting and how that reduces risk of heart attack strokes and cancer. In addition to this discussing medications and how that the medication causes depression or disturbing drains to stop it. Also suicidal ideation stop it. Patient was given a prescription for starter pack as well as to maintenance Pap.  Family history noncontributory Social history she is separated from her husband she states her stress levels are better Review of Systems Negative for any chest pain shortness of breath nausea vomiting swelling in the legs rectal bleeding hematuria.    Objective:   Physical Exam Blood pressure is good her lungs are clear hearts regular pulse normal abdomen soft extremities no edema skin warm dry she has lost some weight       Assessment & Plan:  HTN-overall good control today. She is encouraged to take her medicine watch diet minimize salt intake Cardiomyopathy-no sign that this is acting up currently if she starts having excessive swelling in the legs shortness of breath she needs to be seen immediately. hypokalem-we will check potassium along with kidney functions continue current medications hyperlipid-watch diet closely we will check cholesterol await the results of this  testing. sinusitis-antibiotics prescribed if he has ongoing trouble she needs to followup. Quitting smoking-Chantix as discussed above

## 2013-08-12 ENCOUNTER — Other Ambulatory Visit: Payer: Self-pay | Admitting: Family Medicine

## 2013-08-13 ENCOUNTER — Encounter: Payer: Self-pay | Admitting: Family Medicine

## 2013-08-13 LAB — BASIC METABOLIC PANEL
BUN: 12 mg/dL (ref 6–23)
CO2: 29 mEq/L (ref 19–32)
Calcium: 9.1 mg/dL (ref 8.4–10.5)
Chloride: 99 mEq/L (ref 96–112)
Creat: 1.06 mg/dL (ref 0.50–1.10)
Glucose, Bld: 85 mg/dL (ref 70–99)
Potassium: 3.5 mEq/L (ref 3.5–5.3)
Sodium: 137 mEq/L (ref 135–145)

## 2013-08-13 LAB — HEPATIC FUNCTION PANEL
ALT: 12 U/L (ref 0–35)
AST: 12 U/L (ref 0–37)
Albumin: 4.1 g/dL (ref 3.5–5.2)
Alkaline Phosphatase: 75 U/L (ref 39–117)
Bilirubin, Direct: 0.1 mg/dL (ref 0.0–0.3)
Indirect Bilirubin: 0.5 mg/dL (ref 0.0–0.9)
Total Bilirubin: 0.6 mg/dL (ref 0.3–1.2)
Total Protein: 7 g/dL (ref 6.0–8.3)

## 2013-08-13 LAB — LIPID PANEL
Cholesterol: 185 mg/dL (ref 0–200)
VLDL: 27 mg/dL (ref 0–40)

## 2013-08-15 ENCOUNTER — Telehealth: Payer: Self-pay | Admitting: Family Medicine

## 2013-08-15 NOTE — Telephone Encounter (Signed)
Patient is almost finished with her antibiotic but is still having congestion, sinus pressure and throat pain. She was prescribed this antibiotic last week.    [place]

## 2013-08-15 NOTE — Telephone Encounter (Signed)
Spoke with the patient. She states she is feeling better. I told her to finish her antibiotics and if she feels worst to call us back on Monday. Patient verbalized understanding.

## 2013-08-18 ENCOUNTER — Ambulatory Visit: Payer: Medicaid Other | Admitting: Family Medicine

## 2013-08-19 ENCOUNTER — Telehealth: Payer: Self-pay | Admitting: Obstetrics and Gynecology

## 2013-09-19 ENCOUNTER — Telehealth: Payer: Self-pay | Admitting: Family Medicine

## 2013-09-19 NOTE — Telephone Encounter (Signed)
Prior auth obtained for pt's LYRICA 50mg , expires 08/29/2014 through [facility]

## 2013-09-25 ENCOUNTER — Ambulatory Visit (INDEPENDENT_AMBULATORY_CARE_PROVIDER_SITE_OTHER): Payer: Medicaid Other | Admitting: Nurse Practitioner

## 2013-09-25 ENCOUNTER — Encounter: Payer: Self-pay | Admitting: Nurse Practitioner

## 2013-09-25 VITALS — BP 122/86 | Ht 66.0 in | Wt 199.0 lb

## 2013-09-25 DIAGNOSIS — J029 Acute pharyngitis, unspecified: Secondary | ICD-10-CM

## 2013-09-25 DIAGNOSIS — J111 Influenza due to unidentified influenza virus with other respiratory manifestations: Secondary | ICD-10-CM

## 2013-09-25 LAB — POCT RAPID STREP A (OFFICE): Rapid Strep A Screen: NEGATIVE

## 2013-09-25 MED ORDER — OSELTAMIVIR PHOSPHATE 75 MG PO CAPS: 75.0000 mg | ORAL_CAPSULE | Freq: Two times a day (BID) | ORAL | Status: DC

## 2013-09-25 NOTE — Patient Instructions (Signed)

## 2013-09-26 LAB — STREP A DNA PROBE: GASP: NEGATIVE

## 2013-09-29 ENCOUNTER — Encounter: Payer: Self-pay | Admitting: Nurse Practitioner

## 2013-09-29 NOTE — Progress Notes (Signed)
Subjective:  Presents with complaints of fever congestion and muscle aches that began yesterday. Posttussive vomiting x2. No diarrhea or abdominal pain. Frequent cough. Headache. Irritated throat. Left ear pain. No wheezing. Taking fluids well. Voiding normal limit.  Objective:   BP 122/86  Ht 5\' 6"  (1.676 m)  Wt 199 lb (90.266 kg)  BMI 32.13 kg/m2 NAD. Alert, oriented. Fatigued in appearance. TMs mild clear effusion, no erythema. Pharynx moderate erythema, no exudate. RST negative. Neck supple with moderate soft slightly tender anterior adenopathy. Lungs clear. Heart regular rate rhythm.   Assessment:Acute pharyngitis - Plan: POCT rapid strep A, Strep A DNA probe  Influenza   Plan: Throat culture pending. Meds ordered this encounter  Medications  . oseltamivir (TAMIFLU) 75 MG capsule    Sig: Take 1 capsule (75 mg total) by mouth 2 (two) times daily.    Dispense:  10 capsule    Refill:  0    Order Specific Question:  Supervising Provider    Answer:  Merlyn Albert [[id number]]    Influenza-the patient was diagnosed with influenza. Patient/family educated about the flu and warning signs to watch for. If difficulty breathing, severe neck pain and stiffness, cyanosis, disorientation, or progressive worsening then immediately get rechecked at that ER. If progressive symptoms be certain to be rechecked. Supportive measures such as Tylenol/ibuprofen was discussed. No aspirin use in children. And influenza home care instruction sheet was given.

## 2013-09-30 ENCOUNTER — Telehealth: Payer: Self-pay

## 2013-09-30 NOTE — Telephone Encounter (Signed)
Card mailed to patient notifying her of her throat culture results.

## 2013-11-05 ENCOUNTER — Encounter: Payer: Self-pay | Admitting: Family Medicine

## 2013-11-05 ENCOUNTER — Other Ambulatory Visit: Payer: Self-pay | Admitting: Family Medicine

## 2013-11-05 ENCOUNTER — Ambulatory Visit (INDEPENDENT_AMBULATORY_CARE_PROVIDER_SITE_OTHER): Payer: Medicaid Other | Admitting: Family Medicine

## 2013-11-05 VITALS — BP 110/70 | Temp 98.5°F | Ht 66.0 in | Wt 208.0 lb

## 2013-11-05 DIAGNOSIS — G8929 Other chronic pain: Secondary | ICD-10-CM

## 2013-11-05 DIAGNOSIS — I428 Other cardiomyopathies: Secondary | ICD-10-CM

## 2013-11-05 DIAGNOSIS — J019 Acute sinusitis, unspecified: Secondary | ICD-10-CM

## 2013-11-05 DIAGNOSIS — M549 Dorsalgia, unspecified: Secondary | ICD-10-CM

## 2013-11-05 DIAGNOSIS — J309 Allergic rhinitis, unspecified: Secondary | ICD-10-CM

## 2013-11-05 MED ORDER — AZITHROMYCIN 250 MG PO TABS: ORAL_TABLET | ORAL | Status: DC

## 2013-11-05 MED ORDER — HYDROCODONE-ACETAMINOPHEN 5-325 MG PO TABS: ORAL_TABLET | ORAL | Status: DC

## 2013-11-05 NOTE — Progress Notes (Signed)
   Subjective:    Patient ID: Pamela Mendez, female    DOB: [birth year], 48 y.o.   MRN: [id number]  Hypertension This is a chronic problem. The current episode started more than 1 year ago. There are no compliance problems.   Sinus pressure. Started over 1 month ago. She has a history of cardiomyopathy she states this is been stable she's been trying to keep everything under good control taking her medicines  She has a history of chronic back pain she takes hydrocodone the morning twice per day to try to help keep it under control Patient relates allergies are stable but she does take her medicines and try to help with this.   Review of Systems Patient does relate shortness breath with activity she denies any chest pain rectal bleeding hematuria nausea vomiting diarrhea. Energy level overall fairly good    Objective:   Physical Exam  Lungs no crackle respiratory rate normal Heart regular no murmurs Abdomen soft Extremities no edema Eardrums normal mild sinus tenderness throat normal      Assessment & Plan:  #1 cardiomyopathy-stable. Patient states she had echo 2 years ago blood pressure looks good no edema noted #2 mild obesity she is working on exercise and try to bring her weight down #3 chronic low back pain hydrocodone no more than 2 per day 3 prescriptions were given. Followup 3 months #4 sinus infections Z-Pak prescribed #5 allergies stable continue current medications #6 insomnia Restoril infrequently  Followup 3 months

## 2013-12-12 ENCOUNTER — Ambulatory Visit (INDEPENDENT_AMBULATORY_CARE_PROVIDER_SITE_OTHER): Payer: Medicaid Other | Admitting: Family Medicine

## 2013-12-12 ENCOUNTER — Other Ambulatory Visit: Payer: Self-pay | Admitting: Family Medicine

## 2013-12-12 ENCOUNTER — Encounter: Payer: Self-pay | Admitting: Family Medicine

## 2013-12-12 VITALS — BP 126/72 | Ht 66.0 in | Wt 208.0 lb

## 2013-12-12 DIAGNOSIS — J329 Chronic sinusitis, unspecified: Secondary | ICD-10-CM

## 2013-12-12 DIAGNOSIS — J309 Allergic rhinitis, unspecified: Secondary | ICD-10-CM

## 2013-12-12 DIAGNOSIS — R0989 Other specified symptoms and signs involving the circulatory and respiratory systems: Secondary | ICD-10-CM

## 2013-12-12 MED ORDER — DOXYCYCLINE HYCLATE 100 MG PO CAPS: 100.0000 mg | ORAL_CAPSULE | Freq: Two times a day (BID) | ORAL | Status: DC

## 2013-12-12 MED ORDER — METHYLPREDNISOLONE ACETATE 40 MG/ML IJ SUSP
40.0000 mg | Freq: Once | INTRAMUSCULAR | Status: AC
  Administered 2013-12-12: 40 mg via INTRAMUSCULAR

## 2013-12-12 MED ORDER — CHLORZOXAZONE 500 MG PO TABS: 500.0000 mg | ORAL_TABLET | Freq: Three times a day (TID) | ORAL | Status: DC | PRN

## 2013-12-12 MED ORDER — FLUTICASONE PROPIONATE 50 MCG/ACT NA SUSP: 2.0000 | Freq: Every day | NASAL | Status: DC

## 2013-12-12 NOTE — Progress Notes (Signed)
   Subjective:    Patient ID: Pamela Mendez, female    DOB: 1965/09/24, 48 y.o.   MRN: [id number]  Sinusitis This is a new problem. The current episode started in the past 7 days. There has been no fever. Associated symptoms include congestion, coughing, headaches and sinus pressure. Treatments tried: cetrizine. The treatment provided mild relief.    Allergies  Review of Systems  HENT: Positive for congestion and sinus pressure.   Respiratory: Positive for cough.   Neurological: Positive for headaches.       Objective:   Physical Exam Lungs clear heart regular pulse normal sinuses moderate tenderness. Extremities no edema       Assessment & Plan:  Sinusitis antibiotics prescribed warning signs discussed followup if ongoing troubles

## 2013-12-22 ENCOUNTER — Telehealth: Payer: Self-pay | Admitting: Family Medicine

## 2013-12-22 MED ORDER — CLINDAMYCIN HCL 300 MG PO CAPS: 300.0000 mg | ORAL_CAPSULE | Freq: Three times a day (TID) | ORAL | Status: DC

## 2013-12-22 NOTE — Telephone Encounter (Signed)
Med sent to pharm. Pt notified.  

## 2013-12-22 NOTE — Telephone Encounter (Signed)
May Rx Clindamycin 300 mg , one tid for 7 days.call if worse

## 2013-12-22 NOTE — Telephone Encounter (Signed)
Seen 12/12/13 dx chest congestion, sinusiitis, allergic rhinitis. Prescribed doxy. Finished yesterday. Still having low grade fever, a little wheezing, sinus pressure behind eye. Can something else be sent to pharm.

## 2013-12-22 NOTE — Telephone Encounter (Signed)
Patient is still feeling a little stuffy, but a lot better. Would like to know if we can call in another round of antibiotics?  [facility]

## 2014-01-06 ENCOUNTER — Telehealth: Payer: Self-pay | Admitting: Family Medicine

## 2014-01-06 MED ORDER — CHLORZOXAZONE 500 MG PO TABS: 500.0000 mg | ORAL_TABLET | Freq: Three times a day (TID) | ORAL | Status: DC | PRN

## 2014-01-06 NOTE — Telephone Encounter (Signed)
Done

## 2014-01-06 NOTE — Telephone Encounter (Signed)
Patient needs refill for chlorzoxazon 500 mg tab, 1 tab my mouth 3 times daily.

## 2014-01-06 NOTE — Telephone Encounter (Signed)
Last seen 12/12/13

## 2014-01-06 NOTE — Telephone Encounter (Signed)
1 refill, this medication is meant for occasional use because it can trigger drowsiness

## 2014-02-12 ENCOUNTER — Other Ambulatory Visit: Payer: Self-pay | Admitting: *Deleted

## 2014-02-12 ENCOUNTER — Telehealth: Payer: Self-pay | Admitting: Family Medicine

## 2014-02-12 ENCOUNTER — Other Ambulatory Visit: Payer: Self-pay | Admitting: Family Medicine

## 2014-02-12 MED ORDER — HYDROCODONE-ACETAMINOPHEN 5-325 MG PO TABS: ORAL_TABLET | ORAL | Status: DC

## 2014-02-12 NOTE — Telephone Encounter (Signed)
Last seen 04/10 for sinusitis. Last visit for pain 11/05/13

## 2014-02-12 NOTE — Telephone Encounter (Signed)
Telephone call no answer 

## 2014-02-12 NOTE — Telephone Encounter (Signed)
She may have 5 refills on all of these medicines

## 2014-02-12 NOTE — Telephone Encounter (Signed)
Give 2 week supply. Recommend followup office visit for further refills

## 2014-02-12 NOTE — Telephone Encounter (Signed)
Patient needs Rx for hydrocodone. °

## 2014-02-12 NOTE — Telephone Encounter (Signed)
Patient was notified and transferred to front desk to schedule appointment.  

## 2014-02-17 ENCOUNTER — Encounter: Payer: Self-pay | Admitting: Family Medicine

## 2014-02-17 ENCOUNTER — Ambulatory Visit (INDEPENDENT_AMBULATORY_CARE_PROVIDER_SITE_OTHER): Payer: Medicaid Other | Admitting: Family Medicine

## 2014-02-17 VITALS — BP 128/82 | Ht 66.0 in | Wt 210.0 lb

## 2014-02-17 DIAGNOSIS — M549 Dorsalgia, unspecified: Secondary | ICD-10-CM

## 2014-02-17 DIAGNOSIS — G8929 Other chronic pain: Secondary | ICD-10-CM

## 2014-02-17 MED ORDER — HYDROCODONE-ACETAMINOPHEN 5-325 MG PO TABS: ORAL_TABLET | ORAL | Status: DC

## 2014-02-17 NOTE — Progress Notes (Signed)
   Subjective:    Patient ID: Pamela Mendez, female    DOB: April 13, 1966, 48 y.o.   MRN: [id number]  HPI This patient was seen today for chronic pain  The medication list was reviewed and updated.   -Compliance with pain medication: yes  The patient was advised the importance of maintaining medication and not using illegal substances with these.  Refills needed: yes  The patient was educated that we can provide 3 monthly scripts for their medication, it is their responsibility to follow the instructions.  Side effects or complications from medications: no  Patient is aware that pain medications are meant to minimize the severity of the pain to allow their pain levels to improve to allow for better function. They are aware of that pain medications cannot totally remove their pain.  Due for UDT ( at least once per year) : deferred till later this year    Patient staying physically active trying eat healthy    Review of Systems  Constitutional: Negative for activity change, appetite change and fatigue.  Gastrointestinal: Negative for abdominal pain.  Neurological: Negative for headaches.  Psychiatric/Behavioral: Negative for behavioral problems.       Objective:   Physical Exam  Vitals reviewed. Constitutional: She appears well-nourished. No distress.  HENT:  Head: Normocephalic.  Cardiovascular: Normal rate, regular rhythm and normal heart sounds.   No murmur heard. Pulmonary/Chest: Effort normal and breath sounds normal.  Musculoskeletal: She exhibits no edema.  Lymphadenopathy:    She has no cervical adenopathy.  Neurological: She is alert.  Psychiatric: Her behavior is normal.          Assessment & Plan:  Chronic low back pain prescriptions were given for her medicine this should last her through the end of September followup in approximately 3 months lab work toward the end of this year patient compliant with medicine denies abusing the medicine.

## 2014-05-29 ENCOUNTER — Telehealth: Payer: Self-pay | Admitting: Family Medicine

## 2014-05-29 MED ORDER — HYDROCODONE-ACETAMINOPHEN 5-325 MG PO TABS: ORAL_TABLET | ORAL | Status: DC

## 2014-05-29 NOTE — Telephone Encounter (Signed)
Patient requesting Rx for hydrocodone 

## 2014-05-29 NOTE — Telephone Encounter (Signed)
One mo worth no further til seen

## 2014-05-29 NOTE — Telephone Encounter (Signed)
Last seen 02/17/14

## 2014-05-29 NOTE — Telephone Encounter (Signed)
Home # and cell # has a continuous busy signal. Script is ready for pickup at front desk.

## 2014-06-04 ENCOUNTER — Other Ambulatory Visit: Payer: Self-pay | Admitting: Family Medicine

## 2014-07-06 ENCOUNTER — Encounter: Payer: Self-pay | Admitting: Family Medicine

## 2014-07-06 ENCOUNTER — Ambulatory Visit (INDEPENDENT_AMBULATORY_CARE_PROVIDER_SITE_OTHER): Payer: Medicaid Other | Admitting: Family Medicine

## 2014-07-06 VITALS — BP 110/64 | Temp 99.1°F | Ht 66.0 in | Wt 205.0 lb

## 2014-07-06 DIAGNOSIS — M5441 Lumbago with sciatica, right side: Secondary | ICD-10-CM

## 2014-07-06 DIAGNOSIS — J0100 Acute maxillary sinusitis, unspecified: Secondary | ICD-10-CM

## 2014-07-06 MED ORDER — HYDROCODONE-ACETAMINOPHEN 5-325 MG PO TABS: ORAL_TABLET | ORAL | Status: DC

## 2014-07-06 MED ORDER — PREDNISONE 20 MG PO TABS: ORAL_TABLET | ORAL | Status: DC

## 2014-07-06 MED ORDER — DOXYCYCLINE HYCLATE 100 MG PO CAPS: 100.0000 mg | ORAL_CAPSULE | Freq: Two times a day (BID) | ORAL | Status: DC

## 2014-07-06 NOTE — Progress Notes (Signed)
   Subjective:    Patient ID: Pamela Mendez, female    DOB: 1966-02-06, 48 y.o.   MRN: [id number]  Hip Pain  The incident occurred 2 days ago. There was no injury mechanism. The pain is present in the right hip and right thigh (low back pain). The quality of the pain is described as burning. She has tried heat and NSAIDs for the symptoms.   Sinus pressure, headache, cough, low grade fever. Started several weeks ago.    Review of Systems Relates sciatica down the right leg and in the low back    Objective:   Physical Exam Minimal sinus tenderness eardrums normal part normal neck supple lungs clear heart regular  No loss of strength     Assessment & Plan:  Mild sinus symptoms-more than likely viral if not well over the next for 5 days follow-up. Will go ahead and prescribe antibiotic.  Sciatica right side prednisone taper pain medication if progressive symptoms are worse may need scan recheck in 2 weeks if stable then we will issue her chronic pain medicines at that time

## 2014-07-16 ENCOUNTER — Other Ambulatory Visit: Payer: Self-pay | Admitting: *Deleted

## 2014-07-16 ENCOUNTER — Telehealth: Payer: Self-pay | Admitting: Family Medicine

## 2014-07-16 MED ORDER — LEVOFLOXACIN 500 MG PO TABS: 500.0000 mg | ORAL_TABLET | Freq: Every day | ORAL | Status: AC

## 2014-07-16 NOTE — Telephone Encounter (Signed)
Per Dr. Lorin Picket, send in Levaquin for s/s. Call back if worsens

## 2014-07-16 NOTE — Telephone Encounter (Signed)
Pt seen on 11/2 for acute sinusitis Issued doxycycline and prednisone Pt states she is still having pain an pressure behind  The eyes and through the sinus cavity as well as  A really bad headache.   Wants to know if she needs a different antibiotic or another  round of the prednisone as well?   [facility]

## 2014-07-21 ENCOUNTER — Encounter: Payer: Self-pay | Admitting: Family Medicine

## 2014-07-21 ENCOUNTER — Ambulatory Visit (INDEPENDENT_AMBULATORY_CARE_PROVIDER_SITE_OTHER): Payer: Medicaid Other | Admitting: Family Medicine

## 2014-07-21 VITALS — BP 106/74 | Ht 66.0 in | Wt 212.0 lb

## 2014-07-21 DIAGNOSIS — I159 Secondary hypertension, unspecified: Secondary | ICD-10-CM

## 2014-07-21 DIAGNOSIS — Z1231 Encounter for screening mammogram for malignant neoplasm of breast: Secondary | ICD-10-CM

## 2014-07-21 DIAGNOSIS — Z79899 Other long term (current) drug therapy: Secondary | ICD-10-CM

## 2014-07-21 DIAGNOSIS — M549 Dorsalgia, unspecified: Secondary | ICD-10-CM

## 2014-07-21 DIAGNOSIS — G8929 Other chronic pain: Secondary | ICD-10-CM

## 2014-07-21 DIAGNOSIS — E785 Hyperlipidemia, unspecified: Secondary | ICD-10-CM

## 2014-07-21 MED ORDER — HYDROCODONE-ACETAMINOPHEN 5-325 MG PO TABS: ORAL_TABLET | ORAL | Status: DC

## 2014-07-21 NOTE — Patient Instructions (Signed)

## 2014-07-21 NOTE — Progress Notes (Signed)
   Subjective:    Patient ID: Pamela Mendez, female    DOB: 02-20-1966, 48 y.o.   MRN: [id number]  HPI Patient is here today for a f/u from 11/2 on her back pain.   The pain is getting better. It is not radiating to her hips, nor down her legs, but it is still hurting in her lower back where she had 2 surgeries.  No new concerns.   States when she tried to do Lyrica at a higher dose it caused her to feel nauseous. We will stick with the current dose.  Review of Systems PMH low back pain Relates low back pain some radiation into the hips not down the legs.    Objective:   Physical Exam  Low back moderate tenderness negative straight leg raise worse with certain movements no weakness detected. Lungs clear heart regular      Assessment & Plan:  Significant lumbar back pain continue hydrocodone as previously prescribed to not exceed 3 in a day 75 per month 3 scripts given follow-up in 3 months.  The right blood pressure under good control continue current measures Hyperlipidemia under good control continue current measures check lab work. Mammogram recommended

## 2014-07-23 ENCOUNTER — Ambulatory Visit (HOSPITAL_COMMUNITY)
Admission: RE | Admit: 2014-07-23 | Discharge: 2014-07-23 | Disposition: A | Discharge status: Home / Self Care | Payer: Medicaid Other | Source: Ambulatory Visit | Attending: Family Medicine | Admitting: Family Medicine

## 2014-07-23 ENCOUNTER — Other Ambulatory Visit: Payer: Self-pay | Admitting: Family Medicine

## 2014-07-23 DIAGNOSIS — Z1231 Encounter for screening mammogram for malignant neoplasm of breast: Secondary | ICD-10-CM | POA: Insufficient documentation

## 2014-07-24 LAB — LIPID PANEL
Cholesterol: 191 mg/dL (ref 0–200)
HDL: 39 mg/dL — ABNORMAL LOW (ref >39)
LDL CALC: 107 mg/dL — AB (ref 0–99)
Total CHOL/HDL Ratio: 4.9 Ratio
Triglycerides: 226 mg/dL — ABNORMAL HIGH (ref <150)
VLDL: 45 mg/dL — ABNORMAL HIGH (ref 0–40)

## 2014-07-24 LAB — BASIC METABOLIC PANEL
BUN: 7 mg/dL (ref 6–23)
CHLORIDE: 101 meq/L (ref 96–112)
CO2: 26 meq/L (ref 19–32)
Calcium: 8.5 mg/dL (ref 8.4–10.5)
Creat: 1.02 mg/dL (ref 0.50–1.10)
Glucose, Bld: 92 mg/dL (ref 70–99)
Potassium: 3.5 mEq/L (ref 3.5–5.3)
Sodium: 137 mEq/L (ref 135–145)

## 2014-07-24 LAB — HEPATIC FUNCTION PANEL
ALT: 18 U/L (ref 0–35)
AST: 16 U/L (ref 0–37)
Albumin: 3.7 g/dL (ref 3.5–5.2)
Alkaline Phosphatase: 57 U/L (ref 39–117)
BILIRUBIN DIRECT: 0.1 mg/dL (ref 0.0–0.3)
BILIRUBIN TOTAL: 0.6 mg/dL (ref 0.2–1.2)
Indirect Bilirubin: 0.5 mg/dL (ref 0.2–1.2)
Total Protein: 6.9 g/dL (ref 6.0–8.3)

## 2014-08-04 ENCOUNTER — Other Ambulatory Visit: Payer: Self-pay | Admitting: Family Medicine

## 2014-09-04 ENCOUNTER — Other Ambulatory Visit: Payer: Self-pay | Admitting: Family Medicine

## 2014-09-07 NOTE — Telephone Encounter (Signed)
May have this refill +4 additional refills. Follow-up at regular intervals

## 2014-09-11 ENCOUNTER — Other Ambulatory Visit: Payer: Self-pay | Admitting: Family Medicine

## 2014-09-12 NOTE — Telephone Encounter (Signed)
May have this and 5 refills 

## 2014-10-05 ENCOUNTER — Other Ambulatory Visit: Payer: Self-pay | Admitting: Family Medicine

## 2014-10-20 ENCOUNTER — Ambulatory Visit: Payer: Medicaid Other | Admitting: Family Medicine

## 2014-10-22 ENCOUNTER — Ambulatory Visit (INDEPENDENT_AMBULATORY_CARE_PROVIDER_SITE_OTHER): Payer: Medicaid Other | Admitting: Family Medicine

## 2014-10-22 ENCOUNTER — Encounter: Payer: Self-pay | Admitting: Family Medicine

## 2014-10-22 VITALS — BP 126/82 | Ht 66.0 in | Wt 210.4 lb

## 2014-10-22 DIAGNOSIS — G8929 Other chronic pain: Secondary | ICD-10-CM

## 2014-10-22 DIAGNOSIS — M549 Dorsalgia, unspecified: Secondary | ICD-10-CM

## 2014-10-22 MED ORDER — HYDROCODONE-ACETAMINOPHEN 5-325 MG PO TABS: ORAL_TABLET | ORAL | Status: DC

## 2014-10-22 MED ORDER — DICLOFENAC SODIUM 75 MG PO TBEC: 75.0000 mg | DELAYED_RELEASE_TABLET | Freq: Two times a day (BID) | ORAL | Status: DC

## 2014-10-22 MED ORDER — AZITHROMYCIN 250 MG PO TABS: ORAL_TABLET | ORAL | Status: DC

## 2014-10-22 NOTE — Progress Notes (Signed)
   Subjective:    Patient ID: Pamela Mendez, female    DOB: February 07, 1966, 49 y.o.   MRN: [id number]  HPI  This patient was seen today for chronic pain  The medication list was reviewed and updated.   -Compliance with pain medication: yes  The patient was advised the importance of maintaining medication and not using illegal substances with these.  Refills needed: yes  The patient was educated that we can provide 3 monthly scripts for their medication, it is their responsibility to follow the instructions.  Side effects or complications from medications: no  Patient is aware that pain medications are meant to minimize the severity of the pain to allow their pain levels to improve to allow for better function. They are aware of that pain medications cannot totally remove their pain.  Due for UDT ( at least once per year) : next visit  Right elbow pain-2 to 3 weeks  Trigger- no injury Tried- elbow brace Notices with arm movement See discussion below regarding other chronic health measures    Review of Systems  Constitutional: Negative for fever, activity change, appetite change and fatigue.  HENT: Positive for congestion and rhinorrhea. Negative for ear pain.   Eyes: Negative for discharge.  Respiratory: Positive for cough. Negative for shortness of breath and wheezing.   Cardiovascular: Negative for chest pain.  Gastrointestinal: Negative for abdominal pain.  Neurological: Negative for headaches.  Psychiatric/Behavioral: Negative for behavioral problems.       Objective:   Physical Exam  Constitutional: She appears well-developed.  HENT:  Head: Normocephalic.  Nose: Nose normal.  Mouth/Throat: Oropharynx is clear and moist. No oropharyngeal exudate.  Neck: Neck supple.  Cardiovascular: Normal rate and normal heart sounds.   No murmur heard. Pulmonary/Chest: Effort normal and breath sounds normal. She has no wheezes.  Lymphadenopathy:    She has no cervical  adenopathy.  Skin: Skin is warm and dry.  Nursing note and vitals reviewed.         Assessment & Plan:  Chronic back pain-medication seems to be helping her she denies overusing it 3 prescriptions given she is to follow-up if ongoing trouble Right elbow tendinitis I feel that this is lateral epicondylitis anti-inflammatory ice compressions as well as elbow brace if not doing better over the next few weeks follow-up for injection Sinusitis Z-Pak given per request should get her better if not getting better over the next several days call us Patient relates she is taking her cholesterol medicine on a regular basis continue to watch diet closely Patient relates that reflux medicine is helping she continues this on a regular basis She also uses Lyrica for this sciatica pain does seem to be helping her she would like to continue it

## 2014-10-22 NOTE — Patient Instructions (Signed)

## 2014-10-26 ENCOUNTER — Telehealth: Payer: Self-pay | Admitting: Family Medicine

## 2014-10-26 MED ORDER — DOXYCYCLINE HYCLATE 100 MG PO TABS: 100.0000 mg | ORAL_TABLET | Freq: Two times a day (BID) | ORAL | Status: DC

## 2014-10-26 NOTE — Telephone Encounter (Signed)
Medication sent to pharmacy and patient was notified. 

## 2014-10-26 NOTE — Telephone Encounter (Signed)
Doxycycline 100 mg 1 twice a day for 10 days take with food follow-up if ongoing trouble tall glass of water with medication.

## 2014-10-26 NOTE — Telephone Encounter (Signed)
Patient states that she is having congestion, pressure in her head and slight cough. No fever, sob, wheezing noted.

## 2014-10-26 NOTE — Telephone Encounter (Signed)
Pt seen 2/18 with chronic pain but also treated for sinus infection Given a zpak an told if not better to call back. She is worse at this  [facility], sinus headache, heavy congestion in head, slight cough   [facility]

## 2014-11-03 ENCOUNTER — Other Ambulatory Visit: Payer: Self-pay | Admitting: Family Medicine

## 2014-12-03 ENCOUNTER — Other Ambulatory Visit: Payer: Self-pay | Admitting: Family Medicine

## 2015-01-04 ENCOUNTER — Other Ambulatory Visit: Payer: Self-pay | Admitting: Family Medicine

## 2015-01-20 ENCOUNTER — Ambulatory Visit: Payer: Medicaid Other | Admitting: Family Medicine

## 2015-02-04 ENCOUNTER — Ambulatory Visit (INDEPENDENT_AMBULATORY_CARE_PROVIDER_SITE_OTHER): Payer: Medicaid Other | Admitting: Nurse Practitioner

## 2015-02-04 ENCOUNTER — Other Ambulatory Visit: Payer: Self-pay | Admitting: Family Medicine

## 2015-02-04 ENCOUNTER — Encounter: Payer: Self-pay | Admitting: Nurse Practitioner

## 2015-02-04 VITALS — BP 118/88 | Ht 66.0 in | Wt 210.5 lb

## 2015-02-04 DIAGNOSIS — G8929 Other chronic pain: Secondary | ICD-10-CM | POA: Diagnosis not present

## 2015-02-04 DIAGNOSIS — M549 Dorsalgia, unspecified: Secondary | ICD-10-CM | POA: Diagnosis not present

## 2015-02-04 DIAGNOSIS — R609 Edema, unspecified: Secondary | ICD-10-CM | POA: Diagnosis not present

## 2015-02-04 DIAGNOSIS — J0100 Acute maxillary sinusitis, unspecified: Secondary | ICD-10-CM

## 2015-02-04 DIAGNOSIS — K219 Gastro-esophageal reflux disease without esophagitis: Secondary | ICD-10-CM

## 2015-02-04 MED ORDER — CHLORZOXAZONE 500 MG PO TABS: 500.0000 mg | ORAL_TABLET | Freq: Three times a day (TID) | ORAL | Status: DC | PRN

## 2015-02-04 MED ORDER — HYDROCODONE-ACETAMINOPHEN 5-325 MG PO TABS: ORAL_TABLET | ORAL | Status: DC

## 2015-02-04 MED ORDER — AZITHROMYCIN 250 MG PO TABS: ORAL_TABLET | ORAL | Status: DC

## 2015-02-04 NOTE — Progress Notes (Addendum)
Subjective:  Presents for routine follow up. Pain last week on average 8/10. Limited use of Diclofenac due to stomach upset. Has tried other NSAIDs in the past with either side effects or limited results. Diclofenac does help back pain. Pain worse in evenings and at night. Some relief with Lyrica and Parafon Forte; takes muscle relaxant mainly at night. Also, sinus symptoms x 2 weeks. Maxillary area headache. Ear pain and pressure. No cough or sore throat. One episode of vomiting and diarrhea early this am. Has been exposed to virus. None since. Taking clear fluids well. Voiding nl. Reflux stable.   Objective:   BP 118/88 mmHg  Ht 5\' 6"  (1.676 m)  Wt 210 lb 8 oz (95.482 kg)  BMI 33.99 kg/m2 NAD. Alert, oriented. Allergic shiners noted. TMs retracted, no erythema. Pharynx injected with PND noted. Neck supple with mild anterior adenopathy. Lungs clear. Heart RRR. Abdomen soft, non distended, non tender.   Assessment:  Problem List Items Addressed This Visit      Digestive   GERD (gastroesophageal reflux disease)     Other   Chronic back pain - Primary   Relevant Medications   chlorzoxazone (PARAFON) 500 MG tablet   HYDROcodone-acetaminophen (NORCO/VICODIN) 5-325 MG per tablet   EDEMA    Other Visit Diagnoses    Acute maxillary sinusitis, recurrence not specified        Relevant Medications    azithromycin (ZITHROMAX Z-PAK) 250 MG tablet      Plan:  Meds ordered this encounter  Medications  . chlorzoxazone (PARAFON) 500 MG tablet    Sig: Take 1 tablet (500 mg total) by mouth 3 (three) times daily as needed. for muscle spams    Dispense:  30 tablet    Refill:  2    Order Specific Question:  Supervising Provider    Answer:  Merlyn Albert [[id number]]  . DISCONTD: HYDROcodone-acetaminophen (NORCO/VICODIN) 5-325 MG per tablet    Sig: One tid prn prn pain    Dispense:  75 tablet    Refill:  0    Order Specific Question:  Supervising Provider    Answer:  Merlyn Albert [[id number]]  .  azithromycin (ZITHROMAX Z-PAK) 250 MG tablet    Sig: Take 2 tablets (500 mg) on  Day 1,  followed by 1 tablet (250 mg) once daily on Days 2 through 5.    Dispense:  6 each    Refill:  0    Order Specific Question:  Supervising Provider    Answer:  Merlyn Albert [[id number]]  . DISCONTD: HYDROcodone-acetaminophen (NORCO/VICODIN) 5-325 MG per tablet    Sig: One tid prn prn pain    Dispense:  75 tablet    Refill:  0    May fill 30 days from 02/04/15    Order Specific Question:  Supervising Provider    Answer:  Merlyn Albert [[id number]]  . HYDROcodone-acetaminophen (NORCO/VICODIN) 5-325 MG per tablet    Sig: One tid prn prn pain    Dispense:  75 tablet    Refill:  0    May fill 60 days from 02/04/15    Order Specific Question:  Supervising Provider    Answer:  Merlyn Albert [[id number]]   OTC meds as directed for congestion. Call back if worsens or persists.  Restart Diclofenac with food; stop if reflux worsens. Strongly recommend preventive health physical.  Return in about 3 months (around 05/07/2015) for recheck.

## 2015-02-09 ENCOUNTER — Other Ambulatory Visit: Payer: Self-pay | Admitting: *Deleted

## 2015-02-09 MED ORDER — TORSEMIDE 20 MG PO TABS: ORAL_TABLET | ORAL | Status: DC

## 2015-02-25 ENCOUNTER — Other Ambulatory Visit: Payer: Self-pay

## 2015-02-25 MED ORDER — FLUTICASONE PROPIONATE 50 MCG/ACT NA SUSP: 2.0000 | Freq: Every day | NASAL | Status: DC

## 2015-03-11 ENCOUNTER — Other Ambulatory Visit: Payer: Self-pay | Admitting: Family Medicine

## 2015-03-31 ENCOUNTER — Encounter: Payer: Self-pay | Admitting: Nurse Practitioner

## 2015-03-31 ENCOUNTER — Ambulatory Visit (INDEPENDENT_AMBULATORY_CARE_PROVIDER_SITE_OTHER): Payer: Medicaid Other | Admitting: Nurse Practitioner

## 2015-03-31 VITALS — BP 118/80 | Temp 97.9°F | Wt 205.0 lb

## 2015-03-31 DIAGNOSIS — G43009 Migraine without aura, not intractable, without status migrainosus: Secondary | ICD-10-CM | POA: Insufficient documentation

## 2015-03-31 DIAGNOSIS — M62838 Other muscle spasm: Secondary | ICD-10-CM

## 2015-03-31 DIAGNOSIS — M6248 Contracture of muscle, other site: Secondary | ICD-10-CM

## 2015-03-31 DIAGNOSIS — J0100 Acute maxillary sinusitis, unspecified: Secondary | ICD-10-CM

## 2015-03-31 DIAGNOSIS — G43001 Migraine without aura, not intractable, with status migrainosus: Secondary | ICD-10-CM | POA: Diagnosis not present

## 2015-03-31 MED ORDER — METHYLPREDNISOLONE ACETATE 40 MG/ML IJ SUSP
40.0000 mg | Freq: Once | INTRAMUSCULAR | Status: AC
  Administered 2015-03-31: 40 mg via INTRAMUSCULAR

## 2015-03-31 MED ORDER — AZITHROMYCIN 250 MG PO TABS: ORAL_TABLET | ORAL | Status: DC

## 2015-03-31 MED ORDER — METHOCARBAMOL 750 MG PO TABS: 750.0000 mg | ORAL_TABLET | Freq: Three times a day (TID) | ORAL | Status: DC | PRN

## 2015-03-31 NOTE — Progress Notes (Signed)
Subjective:  Presents for c/o sinus pressure x 1 week. Worse over past 2 days. The area sinus headache. No cough sore throat or fever. Right ear pain. On Flonase and Zyrtec. Has also tried Tylenol Sinus with minimal relief. Has triggered a migraine headache over the past 48 hours. No change in migraine symptomatology. Overall migraines were stable up until this point. Also complaints of pain in the upper back and neck area which may be also contributing to migraine headache.   Objective:   BP 118/80 mmHg  Temp(Src) 97.9 F (36.6 C) (Oral)  Wt 205 lb (92.987 kg) NAD. Alert, oriented. TMs retracted, no erythema. Pharynx mildly erythematous with green PND noted. Neck supple with mild soft anterior adenopathy. Lungs clear. Heart regular rhythm. Extremely tight tender muscles noted along the upper back and neck area more so on the left side.  Assessment:  Problem List Items Addressed This Visit      Cardiovascular and Mediastinum   Migraine headache without aura - Primary   Relevant Medications   methocarbamol (ROBAXIN) 750 MG tablet    Other Visit Diagnoses    Acute maxillary sinusitis, recurrence not specified        Relevant Medications    azithromycin (ZITHROMAX Z-PAK) 250 MG tablet    methylPREDNISolone acetate (DEPO-MEDROL) injection 40 mg (Completed)    Muscle spasms of head and/or neck            Plan:  Meds ordered this encounter  Medications  . azithromycin (ZITHROMAX Z-PAK) 250 MG tablet    Sig: Take 2 tablets (500 mg) on  Day 1,  followed by 1 tablet (250 mg) once daily on Days 2 through 5.    Dispense:  6 each    Refill:  0    Order Specific Question:  Supervising Provider    Answer:  Merlyn Albert [[id number]]  . methocarbamol (ROBAXIN) 750 MG tablet    Sig: Take 1 tablet (750 mg total) by mouth every 8 (eight) hours as needed for muscle spasms.    Dispense:  30 tablet    Refill:  0    Order Specific Question:  Supervising Provider    Answer:  Merlyn Albert [[id number]]   . methylPREDNISolone acetate (DEPO-MEDROL) injection 40 mg    Sig:    Defers oral prednisone at this point because of GI upset. To call back in 48 hours if no improvement, will consider other form of oral steroid. Continue Flonase and Zyrtec as directed. Parafon Forte does not help her muscle spasms, switch to Robaxin. Ice/heat applications Massage therapy OTC TENS unit Icy hot smart relief Warning signs reviewed. Recheck if worsens or persists.

## 2015-03-31 NOTE — Patient Instructions (Signed)
Ice/heat applications Massage therapy OTC TENS unit Icy hot smart relief

## 2015-04-09 ENCOUNTER — Other Ambulatory Visit: Payer: Self-pay | Admitting: Family Medicine

## 2015-04-19 ENCOUNTER — Other Ambulatory Visit: Payer: Self-pay | Admitting: Family Medicine

## 2015-04-19 NOTE — Telephone Encounter (Signed)
Ok this a nd 4 rf 

## 2015-04-20 ENCOUNTER — Other Ambulatory Visit: Payer: Self-pay | Admitting: Family Medicine

## 2015-04-20 ENCOUNTER — Telehealth: Payer: Self-pay | Admitting: Family Medicine

## 2015-04-20 NOTE — Telephone Encounter (Signed)
Patient needs refill on LYRICA 50 MG capsule  [facility]  Pharmacy says that they did not get this yesterday.

## 2015-04-20 NOTE — Telephone Encounter (Signed)
Patient notified prescription faxed to pharmacy.

## 2015-04-20 NOTE — Telephone Encounter (Signed)
I believe this was already handled? Thank you very much

## 2015-05-14 ENCOUNTER — Telehealth: Payer: Self-pay | Admitting: Family Medicine

## 2015-05-14 ENCOUNTER — Other Ambulatory Visit: Payer: Self-pay | Admitting: *Deleted

## 2015-05-14 ENCOUNTER — Other Ambulatory Visit: Payer: Self-pay | Admitting: Family Medicine

## 2015-05-14 ENCOUNTER — Other Ambulatory Visit: Payer: Self-pay | Admitting: Nurse Practitioner

## 2015-05-14 MED ORDER — HYDROCODONE-ACETAMINOPHEN 5-325 MG PO TABS: ORAL_TABLET | ORAL | Status: DC

## 2015-05-14 NOTE — Telephone Encounter (Signed)
Pt notified on vm that script ready for pickup and that she needs ov.

## 2015-05-14 NOTE — Telephone Encounter (Signed)
Last pain management was 02/04/15 and patient was due for follow up 05/07/15

## 2015-05-14 NOTE — Telephone Encounter (Signed)
Pt is requesting a refill on her hydrocodone.  

## 2015-05-14 NOTE — Telephone Encounter (Signed)
1 rf must sched ov in sept

## 2015-06-23 ENCOUNTER — Telehealth: Payer: Self-pay | Admitting: Family Medicine

## 2015-06-23 ENCOUNTER — Other Ambulatory Visit: Payer: Self-pay | Admitting: Family Medicine

## 2015-06-23 MED ORDER — HYDROCODONE-ACETAMINOPHEN 5-325 MG PO TABS: ORAL_TABLET | ORAL | Status: DC

## 2015-06-23 NOTE — Telephone Encounter (Signed)
Rx up front for patient pick up. Patient notified. 

## 2015-06-23 NOTE — Telephone Encounter (Signed)
She may have a prescription for 20 tablets. Unfortunately the law is very clear have to see her at least every 3 months to prescribe the medicines. In the future I will not be able to prescribe even small amounts without regular office visits. This is the same exact way that would have to happen if she was going through a pain clinic-they would require her to come monthly

## 2015-06-23 NOTE — Telephone Encounter (Signed)
May I refill Parafon? 

## 2015-06-23 NOTE — Telephone Encounter (Signed)
HYDROcodone-acetaminophen (NORCO/VICODIN) 5-325 MG per tablet   Pt states due to starting a new job she can not come in for an appt till After the first of November is she can get at least one more refill

## 2015-06-24 NOTE — Telephone Encounter (Signed)
May refill all medicines 3 months

## 2015-07-13 ENCOUNTER — Encounter: Payer: Self-pay | Admitting: Family Medicine

## 2015-07-13 ENCOUNTER — Ambulatory Visit (INDEPENDENT_AMBULATORY_CARE_PROVIDER_SITE_OTHER): Payer: Medicaid Other | Admitting: Family Medicine

## 2015-07-13 VITALS — BP 122/80 | Ht 66.0 in | Wt 198.0 lb

## 2015-07-13 DIAGNOSIS — G8929 Other chronic pain: Secondary | ICD-10-CM | POA: Diagnosis not present

## 2015-07-13 DIAGNOSIS — E785 Hyperlipidemia, unspecified: Secondary | ICD-10-CM

## 2015-07-13 DIAGNOSIS — J019 Acute sinusitis, unspecified: Secondary | ICD-10-CM

## 2015-07-13 DIAGNOSIS — Z79899 Other long term (current) drug therapy: Secondary | ICD-10-CM

## 2015-07-13 DIAGNOSIS — M25552 Pain in left hip: Secondary | ICD-10-CM

## 2015-07-13 DIAGNOSIS — M549 Dorsalgia, unspecified: Secondary | ICD-10-CM | POA: Diagnosis not present

## 2015-07-13 DIAGNOSIS — B9689 Other specified bacterial agents as the cause of diseases classified elsewhere: Secondary | ICD-10-CM

## 2015-07-13 MED ORDER — HYDROCODONE-ACETAMINOPHEN 5-325 MG PO TABS: ORAL_TABLET | ORAL | Status: DC

## 2015-07-13 MED ORDER — AZITHROMYCIN 250 MG PO TABS: ORAL_TABLET | ORAL | Status: DC

## 2015-07-13 MED ORDER — PREGABALIN 75 MG PO CAPS: 75.0000 mg | ORAL_CAPSULE | Freq: Two times a day (BID) | ORAL | Status: DC

## 2015-07-13 NOTE — Patient Instructions (Signed)

## 2015-07-13 NOTE — Progress Notes (Signed)
   Subjective:    Patient ID: Pamela Mendez, female    DOB: 1965/10/17, 49 y.o.   MRN: [id number]  HPI This patient was seen today for chronic pain  The medication list was reviewed and updated.   -Compliance with medication: takes mostly twice a day a few times had to take one tid. Having chronic back pain and left hip pain just started the past couple of weeks  - Number patient states they take daily: bid to tid  -when was the last dose patient took? Last took dose on Saturday.   The patient was advised the importance of maintaining medication and not using illegal substances with these.  Refills needed: yes  The patient was educated that we can provide 3 monthly scripts for their medication, it is their responsibility to follow the instructions.  Side effects or complications from medications: none  Patient is aware that pain medications are meant to minimize the severity of the pain to allow their pain levels to improve to allow for better function. They are aware of that pain medications cannot totally remove their pain.  Due for UDT ( at least once per year) : do at next visit  Sinus drainage and some ear pain off and on. Started several weeks ago.   Greater than 25 minutes was spent today discussing her back pain and hip pain sinus pressure hyperlipidemia blood pressure issues and chronic pain patient was advised never to use her pain medicine keep it in a safe place never drive if she feels drowsy also advised to keep regular follow-ups here.    Review of Systems  Constitutional: Negative for activity change and appetite change.  Gastrointestinal: Negative for vomiting and abdominal pain.  Neurological: Negative for weakness.  Psychiatric/Behavioral: Negative for confusion.       Objective:   Physical Exam  Constitutional: She appears well-nourished. No distress.  HENT:  Head: Normocephalic.  Cardiovascular: Normal rate, regular rhythm and normal heart sounds.     No murmur heard. Pulmonary/Chest: Effort normal and breath sounds normal.  Musculoskeletal: She exhibits no edema.  Lymphadenopathy:    She has no cervical adenopathy.  Neurological: She is alert.  Psychiatric: Her behavior is normal.  Vitals reviewed.   25 minutes was spent with the patient. Greater than half the time was spent in discussion and answering questions and counseling regarding the issues that the patient came in for today.       Assessment & Plan:  Patient with chronic back pain worse radiates into the left she would like to go back to see neurosurgeon for injections  Hyperlipidemia check lipid profile continue medication Blood pressure borderline watch diet check metabolic 7 continue diuretic continue lisinopril  Chronic pain patient may use up to 3 tablets per day she is been out of her medicine for over a week we will not do urine drug test today will do a next visit  Sinus pressure pain discomfort multiple drug allergies patient states Zithromax does well with her we will prescribe this follow-up if ongoing trouble  Left hip pain discomfort x-rays ordered

## 2015-07-17 ENCOUNTER — Encounter: Payer: Self-pay | Admitting: Family Medicine

## 2015-07-18 LAB — LIPID PANEL
CHOL/HDL RATIO: 3.9 ratio (ref 0.0–4.4)
CHOLESTEROL TOTAL: 184 mg/dL (ref 100–199)
HDL: 47 mg/dL (ref >39)
LDL Calculated: 105 mg/dL — ABNORMAL HIGH (ref 0–99)
TRIGLYCERIDES: 159 mg/dL — AB (ref 0–149)
VLDL Cholesterol Cal: 32 mg/dL (ref 5–40)

## 2015-07-18 LAB — BASIC METABOLIC PANEL
BUN/Creatinine Ratio: 11 (ref 9–23)
BUN: 12 mg/dL (ref 6–24)
CALCIUM: 9.3 mg/dL (ref 8.7–10.2)
CHLORIDE: 95 mmol/L — AB (ref 97–106)
CO2: 26 mmol/L (ref 18–29)
Creatinine, Ser: 1.09 mg/dL — ABNORMAL HIGH (ref 0.57–1.00)
GFR calc non Af Amer: 60 mL/min/{1.73_m2} (ref >59)
GFR, EST AFRICAN AMERICAN: 69 mL/min/{1.73_m2} (ref >59)
GLUCOSE: 101 mg/dL — AB (ref 65–99)
POTASSIUM: 3.3 mmol/L — AB (ref 3.5–5.2)
Sodium: 141 mmol/L (ref 136–144)

## 2015-07-18 LAB — HEPATIC FUNCTION PANEL
ALT: 17 IU/L (ref 0–32)
AST: 16 IU/L (ref 0–40)
Albumin: 4.2 g/dL (ref 3.5–5.5)
Alkaline Phosphatase: 69 IU/L (ref 39–117)
Bilirubin Total: 0.6 mg/dL (ref 0.0–1.2)
Bilirubin, Direct: 0.13 mg/dL (ref 0.00–0.40)
Total Protein: 7.1 g/dL (ref 6.0–8.5)

## 2015-07-20 ENCOUNTER — Ambulatory Visit (HOSPITAL_COMMUNITY)
Admission: RE | Admit: 2015-07-20 | Discharge: 2015-07-20 | Disposition: A | Discharge status: Home / Self Care | Payer: Medicaid Other | Source: Ambulatory Visit | Attending: Family Medicine | Admitting: Family Medicine

## 2015-07-20 ENCOUNTER — Other Ambulatory Visit: Payer: Self-pay | Admitting: *Deleted

## 2015-07-20 ENCOUNTER — Telehealth: Payer: Self-pay | Admitting: Family Medicine

## 2015-07-20 DIAGNOSIS — M25552 Pain in left hip: Secondary | ICD-10-CM | POA: Insufficient documentation

## 2015-07-20 DIAGNOSIS — E876 Hypokalemia: Secondary | ICD-10-CM

## 2015-07-20 MED ORDER — POTASSIUM CHLORIDE CRYS ER 20 MEQ PO TBCR: EXTENDED_RELEASE_TABLET | ORAL | Status: DC

## 2015-07-20 NOTE — Telephone Encounter (Signed)
Rx prior auth APPROVED for pt's pregabalin (LYRICA) 75 MG capsule, expires 08/18/15, faxed approval to [facility]

## 2015-07-23 ENCOUNTER — Other Ambulatory Visit: Payer: Self-pay | Admitting: Nurse Practitioner

## 2015-09-14 ENCOUNTER — Emergency Department (HOSPITAL_COMMUNITY)
Admission: EM | Admit: 2015-09-14 | Discharge: 2015-09-15 | Disposition: A | Discharge status: Home / Self Care | Payer: Medicaid Other | Attending: Emergency Medicine | Admitting: Emergency Medicine

## 2015-09-14 ENCOUNTER — Emergency Department (HOSPITAL_COMMUNITY): Payer: Medicaid Other

## 2015-09-14 ENCOUNTER — Encounter (HOSPITAL_COMMUNITY): Payer: Self-pay | Admitting: Emergency Medicine

## 2015-09-14 DIAGNOSIS — R0789 Other chest pain: Secondary | ICD-10-CM | POA: Insufficient documentation

## 2015-09-14 DIAGNOSIS — I10 Essential (primary) hypertension: Secondary | ICD-10-CM | POA: Insufficient documentation

## 2015-09-14 DIAGNOSIS — E785 Hyperlipidemia, unspecified: Secondary | ICD-10-CM | POA: Diagnosis not present

## 2015-09-14 DIAGNOSIS — R0602 Shortness of breath: Secondary | ICD-10-CM | POA: Insufficient documentation

## 2015-09-14 DIAGNOSIS — Z87891 Personal history of nicotine dependence: Secondary | ICD-10-CM | POA: Diagnosis not present

## 2015-09-14 DIAGNOSIS — G43009 Migraine without aura, not intractable, without status migrainosus: Secondary | ICD-10-CM | POA: Diagnosis not present

## 2015-09-14 DIAGNOSIS — Z8659 Personal history of other mental and behavioral disorders: Secondary | ICD-10-CM | POA: Diagnosis not present

## 2015-09-14 DIAGNOSIS — G8929 Other chronic pain: Secondary | ICD-10-CM | POA: Diagnosis not present

## 2015-09-14 DIAGNOSIS — M545 Low back pain: Secondary | ICD-10-CM | POA: Diagnosis not present

## 2015-09-14 DIAGNOSIS — I509 Heart failure, unspecified: Secondary | ICD-10-CM | POA: Diagnosis not present

## 2015-09-14 DIAGNOSIS — M549 Dorsalgia, unspecified: Secondary | ICD-10-CM

## 2015-09-14 DIAGNOSIS — Z7951 Long term (current) use of inhaled steroids: Secondary | ICD-10-CM | POA: Diagnosis not present

## 2015-09-14 DIAGNOSIS — E876 Hypokalemia: Secondary | ICD-10-CM | POA: Insufficient documentation

## 2015-09-14 DIAGNOSIS — K219 Gastro-esophageal reflux disease without esophagitis: Secondary | ICD-10-CM | POA: Diagnosis not present

## 2015-09-14 DIAGNOSIS — Z79899 Other long term (current) drug therapy: Secondary | ICD-10-CM | POA: Diagnosis not present

## 2015-09-14 DIAGNOSIS — Z3202 Encounter for pregnancy test, result negative: Secondary | ICD-10-CM | POA: Diagnosis not present

## 2015-09-14 LAB — BASIC METABOLIC PANEL
ANION GAP: 11 (ref 5–15)
BUN: 23 mg/dL — ABNORMAL HIGH (ref 6–20)
CO2: 25 mmol/L (ref 22–32)
Calcium: 8.8 mg/dL — ABNORMAL LOW (ref 8.9–10.3)
Chloride: 98 mmol/L — ABNORMAL LOW (ref 101–111)
Creatinine, Ser: 1.22 mg/dL — ABNORMAL HIGH (ref 0.44–1.00)
GFR calc Af Amer: 60 mL/min — ABNORMAL LOW (ref >60)
GFR, EST NON AFRICAN AMERICAN: 52 mL/min — AB (ref >60)
GLUCOSE: 210 mg/dL — AB (ref 65–99)
POTASSIUM: 3 mmol/L — AB (ref 3.5–5.1)
Sodium: 134 mmol/L — ABNORMAL LOW (ref 135–145)

## 2015-09-14 LAB — CBC WITH DIFFERENTIAL/PLATELET
Basophils Absolute: 0.1 10*3/uL (ref 0.0–0.1)
Basophils Relative: 0 %
Eosinophils Absolute: 0.2 10*3/uL (ref 0.0–0.7)
Eosinophils Relative: 1 %
HCT: 43 % (ref 36.0–46.0)
HEMOGLOBIN: 14.6 g/dL (ref 12.0–15.0)
LYMPHS ABS: 3.5 10*3/uL (ref 0.7–4.0)
LYMPHS PCT: 24 %
MCH: 30.4 pg (ref 26.0–34.0)
MCHC: 34 g/dL (ref 30.0–36.0)
MCV: 89.6 fL (ref 78.0–100.0)
Monocytes Absolute: 1.1 10*3/uL — ABNORMAL HIGH (ref 0.1–1.0)
Monocytes Relative: 8 %
NEUTROS ABS: 9.9 10*3/uL — AB (ref 1.7–7.7)
NEUTROS PCT: 67 %
Platelets: 263 10*3/uL (ref 150–400)
RBC: 4.8 MIL/uL (ref 3.87–5.11)
RDW: 13.1 % (ref 11.5–15.5)
WBC: 14.8 10*3/uL — AB (ref 4.0–10.5)

## 2015-09-14 LAB — TROPONIN I

## 2015-09-14 NOTE — ED Notes (Signed)
Patient states "I was just sitting there and all of a sudden it started feeling like someone was squeezing my right side and it came around to my stomach and up into my chest. And I felt like I couldn't breath." Patient states she is no longer hurting but is still short of breath.

## 2015-09-15 LAB — URINALYSIS, ROUTINE W REFLEX MICROSCOPIC
BILIRUBIN URINE: NEGATIVE
Glucose, UA: NEGATIVE mg/dL
Ketones, ur: NEGATIVE mg/dL
LEUKOCYTES UA: NEGATIVE
NITRITE: NEGATIVE
PH: 6 (ref 5.0–8.0)
Protein, ur: NEGATIVE mg/dL
SPECIFIC GRAVITY, URINE: 1.015 (ref 1.005–1.030)

## 2015-09-15 LAB — URINE MICROSCOPIC-ADD ON

## 2015-09-15 LAB — HEPATIC FUNCTION PANEL
ALT: 38 U/L (ref 14–54)
AST: 51 U/L — ABNORMAL HIGH (ref 15–41)
Albumin: 3.7 g/dL (ref 3.5–5.0)
Alkaline Phosphatase: 62 U/L (ref 38–126)
BILIRUBIN DIRECT: 0.1 mg/dL (ref 0.1–0.5)
BILIRUBIN INDIRECT: 0.3 mg/dL (ref 0.3–0.9)
BILIRUBIN TOTAL: 0.4 mg/dL (ref 0.3–1.2)
Total Protein: 7.2 g/dL (ref 6.5–8.1)

## 2015-09-15 LAB — TROPONIN I

## 2015-09-15 LAB — PREGNANCY, URINE: PREG TEST UR: NEGATIVE

## 2015-09-15 MED ORDER — POTASSIUM CHLORIDE CRYS ER 20 MEQ PO TBCR
40.0000 meq | EXTENDED_RELEASE_TABLET | Freq: Once | ORAL | Status: AC
  Administered 2015-09-15: 40 meq via ORAL
  Filled 2015-09-15: qty 2

## 2015-09-15 NOTE — ED Provider Notes (Signed)
CSN: [id number]     Arrival date & time 09/14/15  2100 History   First MD Initiated Contact with Patient 09/14/15 2303     Chief Complaint  Patient presents with  . Shortness of Breath     (Consider location/radiation/quality/duration/timing/severity/associated sxs/prior Treatment) The history is provided by the patient and the spouse.   Pamela Mendez is a 50 y.o. 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  She describes inability to take a deep breath during these episodes during which time her pain was also worsened with movement and attempts to move her arms.  She had 2 distinct episodes, each lasting less than 2 minutes, occurring while she was sitting at home watching a movie with first episode approximately 8 PM this evening.  She denies nausea, emesis, palpitations, diaphoresis.  She states her pain somewhat reminded her of gallstone pain but in a different distribution.  She is currently pain-free,  Felt short of breath when she first arrived, but is now symptom free.  She has had no medications prior to arrival for pain relief.     Past Medical History  Diagnosis Date  . Hypertension   . CHF (congestive heart failure) (HCC)   . Anxiety and depression   . Cardiomyopathy     mild  . Abdominal pain     Unspecified site  . Hyperlipidemia   . Anxiety   . Depression   . GERD (gastroesophageal reflux disease)   . Chronic back pain   . Migraine headache without aura   . Gastritis    Past Surgical History  Procedure Laterality Date  . Lumbar disc surgery  2010    [place], Dr Marikay Alar  . Childbirth      time 3  . Cholecystectomy  2005    [facility] , Dr Katrinka Blazing  . Lumbar disc surgery  2005   [place], Dr Marikay Alar   Family History  Problem Relation Age of Onset  . Heart attack Father   . Anesthesia problems Neg Hx   . Hypotension Neg Hx   . Malignant hyperthermia Neg Hx   . Pseudochol deficiency Neg Hx    Social History  Substance Use Topics  . Smoking status: Former Smoker -- 0.25 packs/day for 15 years    Types: Cigarettes    Quit date: 08/23/2013  . Smokeless tobacco: None  . Alcohol Use: No   OB History    Gravida Para Term Preterm AB TAB SAB Ectopic Multiple Living   3 3 3       3      Review of Systems  Constitutional: Negative for fever.  HENT: Negative for congestion and sore throat.   Eyes: Negative.   Respiratory: Positive for chest tightness and shortness of breath.   Cardiovascular: Negative for chest pain.  Gastrointestinal: Negative for nausea and abdominal pain.  Genitourinary: Negative.   Musculoskeletal: Positive for back pain. Negative for joint swelling, arthralgias and neck pain.  Skin: Negative.  Negative for rash and wound.  Neurological: Negative for dizziness, weakness, light-headedness, numbness and headaches.  Psychiatric/Behavioral: Negative.       Allergies  Sulfa antibiotics; Augmentin; Avelox; and Cefzil  Home Medications   Prior to Admission medications   Medication Sig Start Date End  Date Taking? Authorizing Provider  cetirizine (ZYRTEC) 10 MG tablet TAKE ONE TABLET BY MOUTH AT BEDTIME AS NEEDED FOR ALLERGY Patient taking differently: TAKE ONE TABLET BY MOUTH AT BEDTIME 06/24/15  Yes Babs Sciara, MD  chlorzoxazone (PARAFON) 500 MG tablet TAKE ONE TABLET BY MOUTH THREE TIMES DAILY AS NEEDED FOR MUSCLE SPASM 06/24/15  Yes Babs Sciara, MD  fluticasone (FLONASE) 50 MCG/ACT nasal spray Place 2 sprays into both nostrils daily. Patient taking differently: Place 2 sprays into both nostrils daily as needed for allergies or rhinitis.  02/25/15  Yes Babs Sciara, MD  HYDROcodone-acetaminophen (NORCO/VICODIN) 5-325 MG  tablet One tid prn prn pain Patient taking differently: Take 1 tablet by mouth 3 (three) times daily as needed for moderate pain or severe pain. One tid prn prn pain 07/13/15  Yes Babs Sciara, MD  lisinopril (PRINIVIL,ZESTRIL) 5 MG tablet TAKE ONE TABLET BY MOUTH ONCE DAILY NEEDS  OFFICE  VISIT Patient taking differently: TAKE ONE TABLET BY MOUTH ONCE DAILY 06/24/15  Yes Babs Sciara, MD  pantoprazole (PROTONIX) 40 MG tablet TAKE ONE TABLET BY MOUTH ONCE DAILY NEEDS  OFFICE  VISIT Patient taking differently: TAKE ONE TABLET BY MOUTH ONCE DAILY 06/24/15  Yes Babs Sciara, MD  potassium chloride SA (KLOR-CON M20) 20 MEQ tablet Take 2 tablets qam one at noon and 2 in the evening. Patient taking differently: Take 20-40 mEq by mouth 3 (three) times daily. Take 2 tablets qam one at noon and 2 in the evening. 07/20/15  Yes Babs Sciara, MD  pregabalin (LYRICA) 75 MG capsule Take 1 capsule (75 mg total) by mouth 2 (two) times daily. 07/13/15  Yes Babs Sciara, MD  simvastatin (ZOCOR) 20 MG tablet TAKE ONE TABLET BY MOUTH ONCE DAILY NEEDS  OFFICE  VISIT Patient taking differently: TAKE ONE TABLET BY MOUTH ONCE DAILY 06/24/15  Yes Babs Sciara, MD  torsemide (DEMADEX) 20 MG tablet TAKE THREE TABLETS BY MOUTH IN THE MORNING AND TWO AT NOON Patient taking differently: Take 40-60 mg by mouth 2 (two) times daily. TAKE THREE TABLETS BY MOUTH IN THE MORNING AND TWO AT NOON 02/09/15  Yes Scott A Luking, MD   BP 106/59 mmHg  Pulse 66  Temp(Src) 98 F (36.7 C) (Oral)  Resp 16  Ht  (1.727 m)  Wt 89.812 kg  BMI 30.11 kg/m2  SpO2 100% Physical Exam  Constitutional: She appears well-developed and well-nourished.  HENT:  Head: Normocephalic and atraumatic.  Eyes: Conjunctivae are normal.  Neck: Normal range of motion.  Cardiovascular: Normal rate, regular rhythm, normal heart sounds and intact distal pulses.   Pulmonary/Chest: Effort normal and breath sounds normal. She has no wheezes. She  exhibits no tenderness.  Abdominal: Soft. Bowel sounds are normal. She exhibits no distension. There is no tenderness. There is no guarding.  Musculoskeletal: Normal range of motion. She exhibits no edema.       Thoracic back: She exhibits tenderness.  Mild ttp right mid thoracic to lumbar soft tissue.  No midline pain. Bilateral calves soft, nontender.  Negative Homan's sign.   Neurological: She is alert.  Skin: Skin is warm and dry.  Psychiatric: She has a normal mood and affect.  Nursing note and vitals reviewed.   ED Course  Procedures (including critical care time) Labs Review Labs Reviewed  CBC WITH DIFFERENTIAL/PLATELET - Abnormal; Notable for the following:    WBC 14.8 (*)    Neutro Abs 9.9 (*)  Monocytes Absolute 1.1 (*)    All other components within normal limits  BASIC METABOLIC PANEL - Abnormal; Notable for the following:    Sodium 134 (*)    Potassium 3.0 (*)    Chloride 98 (*)    Glucose, Bld 210 (*)    BUN 23 (*)    Creatinine, Ser 1.22 (*)    Calcium 8.8 (*)    GFR calc non Af Amer 52 (*)    GFR calc Af Amer 60 (*)    All other components within normal limits  HEPATIC FUNCTION PANEL - Abnormal; Notable for the following:    AST 51 (*)    All other components within normal limits  URINALYSIS, ROUTINE W REFLEX MICROSCOPIC (NOT AT [facility]) - Abnormal; Notable for the following:    Hgb urine dipstick TRACE (*)    All other components within normal limits  URINE MICROSCOPIC-ADD ON - Abnormal; Notable for the following:    Squamous Epithelial / LPF 6-30 (*)    Bacteria, UA MANY (*)    All other components within normal limits  URINE CULTURE  TROPONIN I  PREGNANCY, URINE  TROPONIN I    Imaging Review Dg Chest 2 View  09/14/2015  CLINICAL DATA:  Shortness of breath. EXAM: CHEST  2 VIEW COMPARISON:  October 27, 2010. FINDINGS: The heart size and mediastinal contours are within normal limits. Both lungs are clear. No pneumothorax or pleural effusion is noted.  The visualized skeletal structures are unremarkable. IMPRESSION: No active cardiopulmonary disease. Electronically Signed   By: Lupita Raider, M.D.   On: 09/14/2015 21:38   I have personally reviewed and evaluated these images and lab results as part of my medical decision-making.   EKG Interpretation   Date/Time:  Tuesday September 14 2015 21:13:36 EST Ventricular Rate:  69 PR Interval:  174 QRS Duration: 84 QT Interval:  402 QTC Calculation: 430 R Axis:   75 Text Interpretation:  Normal sinus rhythm Normal ECG Confirmed by WARD,   DO, KRISTEN ([id number]) on 09/15/2015 1:18:15 AM      MDM   Final diagnoses:  Shortness of breath  Bilateral back pain, unspecified location  Other chest pain  Hypokalemia    Discussed patient with Dr Elesa Massed who will see pt and dispo.  Second 3 hour troponin ordered.     Burgess Amor, PA-C 09/15/15 0131  Layla Maw Ward, DO 09/15/15 6045

## 2015-09-15 NOTE — Discharge Instructions (Signed)
Back Pain, Adult °Back pain is very common in adults. The cause of back pain is rarely dangerous and the pain often gets better over time. The cause of your back pain may not be known. Some common causes of back pain include: °· Strain of the muscles or ligaments supporting the spine. °· Wear and tear (degeneration) of the spinal disks. °· Arthritis. °· Direct injury to the back. °For many people, back pain may return. Since back pain is rarely dangerous, most people can learn to manage this condition on their own. °HOME CARE INSTRUCTIONS °Watch your back pain for any changes. The following actions may help to lessen any discomfort you are feeling: °· Remain active. It is stressful on your back to sit or stand in one place for long periods of time. Do not sit, drive, or stand in one place for more than 30 minutes at a time. Take short walks on even surfaces as soon as you are able. Try to increase the length of time you walk each day. °· Exercise regularly as directed by your health care provider. Exercise helps your back heal faster. It also helps avoid future injury by keeping your muscles strong and flexible. °· Do not stay in bed. Resting more than 1-2 days can delay your recovery. °· Pay attention to your body when you bend and lift. The most comfortable positions are those that put less stress on your recovering back. Always use proper lifting techniques, including: °· Bending your knees. °· Keeping the load close to your body. °· Avoiding twisting. °· Find a comfortable position to sleep. Use a firm mattress and lie on your side with your knees slightly bent. If you lie on your back, put a pillow under your knees. °· Avoid feeling anxious or stressed. Stress increases muscle tension and can worsen back pain. It is important to recognize when you are anxious or stressed and learn ways to manage it, such as with exercise. °· Take medicines only as directed by your health care provider. Over-the-counter  medicines to reduce pain and inflammation are often the most helpful. Your health care provider may prescribe muscle relaxant drugs. These medicines help dull your pain so you can more quickly return to your normal activities and healthy exercise. °· Apply ice to the injured area: °· Put ice in a plastic bag. °· Place a towel between your skin and the bag. °· Leave the ice on for 20 minutes, 2-3 times a day for the first 2-3 days. After that, ice and heat may be alternated to reduce pain and spasms. °· Maintain a healthy weight. Excess weight puts extra stress on your back and makes it difficult to maintain good posture. °SEEK MEDICAL CARE IF: °· You have pain that is not relieved with rest or medicine. °· You have increasing pain going down into the legs or buttocks. °· You have pain that does not improve in one week. °· You have night pain. °· You lose weight. °· You have a fever or chills. °SEEK IMMEDIATE MEDICAL CARE IF:  °· You develop new bowel or bladder control problems. °· You have unusual weakness or numbness in your arms or legs. °· You develop nausea or vomiting. °· You develop abdominal pain. °· You feel faint. °  °This information is not intended to replace advice given to you by your health care provider. Make sure you discuss any questions you have with your health care provider. °  °Document Released: 08/21/2005 Document Revised: 09/11/2014 Document Reviewed: 12/23/2013 °Elsevier Interactive Patient Education ©2016 Else[employer]   Inc. ° °Nonspecific Chest Pain  °Chest pain can be caused by many different conditions. There is always a chance that your pain could be related to something serious, such as a heart attack or a blood clot in your lungs. Chest pain can also be caused by conditions that are not life-threatening. If you have chest pain, it is very important to follow up with your health care provider. °CAUSES  °Chest pain can be caused by: °· Heartburn. °· Pneumonia or bronchitis. °· Anxiety or  stress. °· Inflammation around your heart (pericarditis) or lung (pleuritis or pleurisy). °· A blood clot in your lung. °· A collapsed lung (pneumothorax). It can develop suddenly on its own (spontaneous pneumothorax) or from trauma to the chest. °· Shingles infection (varicella-zoster virus). °· Heart attack. °· Damage to the bones, muscles, and cartilage that make up your chest wall. This can include: °¨ Bruised bones due to injury. °¨ Strained muscles or cartilage due to frequent or repeated coughing or overwork. °¨ Fracture to one or more ribs. °¨ Sore cartilage due to inflammation (costochondritis). °RISK FACTORS  °Risk factors for chest pain may include: °· Activities that increase your risk for trauma or injury to your chest. °· Respiratory infections or conditions that cause frequent coughing. °· Medical conditions or overeating that can cause heartburn. °· Heart disease or family history of heart disease. °· Conditions or health behaviors that increase your risk of developing a blood clot. °· Having had chicken pox (varicella zoster). °SIGNS AND SYMPTOMS °Chest pain can feel like: °· Burning or tingling on the surface of your chest or deep in your chest. °· Crushing, pressure, aching, or squeezing pain. °· Dull or sharp pain that is worse when you move, cough, or take a deep breath. °· Pain that is also felt in your back, neck, shoulder, or arm, or pain that spreads to any of these areas. °Your chest pain may come and go, or it may stay constant. °DIAGNOSIS °Lab tests or other studies may be needed to find the cause of your pain. Your health care provider may have you take a test called an ambulatory ECG (electrocardiogram). An ECG records your heartbeat patterns at the time the test is performed. You may also have other tests, such as: °· Transthoracic echocardiogram (TTE). During echocardiography, sound waves are used to create a picture of all of the heart structures and to look at how blood flows  through your heart. °· Transesophageal echocardiogram (TEE). This is a more advanced imaging test that obtains images from inside your body. It allows your health care provider to see your heart in finer detail. °· Cardiac monitoring. This allows your health care provider to monitor your heart rate and rhythm in real time. °· Holter monitor. This is a portable device that records your heartbeat and can help to diagnose abnormal heartbeats. It allows your health care provider to track your heart activity for several days, if needed. °· Stress tests. These can be done through exercise or by taking medicine that makes your heart beat more quickly. °· Blood tests. °· Imaging tests. °TREATMENT  °Your treatment depends on what is causing your chest pain. Treatment may include: °· Medicines. These may include: °¨ Acid blockers for heartburn. °¨ Anti-inflammatory medicine. °¨ Pain medicine for inflammatory conditions. °¨ Antibiotic medicine, if an infection is present. °¨ Medicines to dissolve blood clots. °¨ Medicines to treat coronary artery disease. °· Supportive care for conditions that do not require medicines. This may include: °¨ Resting. °¨   Applying heat or cold packs to injured areas. °¨ Limiting activities until pain decreases. °HOME CARE INSTRUCTIONS °· If you were prescribed an antibiotic medicine, finish it all even if you start to feel better. °· Avoid any activities that bring on chest pain. °· Do not use any tobacco products, including cigarettes, chewing tobacco, or electronic cigarettes. If you need help quitting, ask your health care provider. °· Do not drink alcohol. °· Take medicines only as directed by your health care provider. °· Keep all follow-up visits as directed by your health care provider. This is important. This includes any further testing if your chest pain does not go away. °· If heartburn is the cause for your chest pain, you may be told to keep your head raised (elevated) while sleeping.  This reduces the chance that acid will go from your stomach into your esophagus. °· Make lifestyle changes as directed by your health care provider. These may include: °¨ Getting regular exercise. Ask your health care provider to suggest some activities that are safe for you. °¨ Eating a heart-healthy diet. A registered dietitian can help you to learn healthy eating options. °¨ Maintaining a healthy weight. °¨ Managing diabetes, if necessary. °¨ Reducing stress. °SEEK MEDICAL CARE IF: °· Your chest pain does not go away after treatment. °· You have a rash with blisters on your chest. °· You have a fever. °SEEK IMMEDIATE MEDICAL CARE IF:  °· Your chest pain is worse. °· You have an increasing cough, or you cough up blood. °· You have severe abdominal pain. °· You have severe weakness. °· You faint. °· You have chills. °· You have sudden, unexplained chest discomfort. °· You have sudden, unexplained discomfort in your arms, back, neck, or jaw. °· You have shortness of breath at any time. °· You suddenly start to sweat, or your skin gets clammy. °· You feel nauseous or you vomit. °· You suddenly feel light-headed or dizzy. °· Your heart begins to beat quickly, or it feels like it is skipping beats. °These symptoms may represent a serious problem that is an emergency. Do not wait to see if the symptoms will go away. Get medical help right away. Call your local emergency services (911 in the [place] Do not drive yourself to the hospital. °  °This information is not intended to replace advice given to you by your health care provider. Make sure you discuss any questions you have with your health care provider. °  °Document Released: 05/31/2005 Document Revised: 09/11/2014 Document Reviewed: 03/27/2014 °Elsevier Interactive Patient Education ©2016 Elsevier Inc. ° °

## 2015-09-16 ENCOUNTER — Telehealth: Payer: Self-pay | Admitting: Family Medicine

## 2015-09-16 DIAGNOSIS — E876 Hypokalemia: Secondary | ICD-10-CM

## 2015-09-16 LAB — URINE CULTURE: Special Requests: NORMAL

## 2015-09-16 MED ORDER — POTASSIUM CHLORIDE CRYS ER 20 MEQ PO TBCR: EXTENDED_RELEASE_TABLET | ORAL | Status: DC

## 2015-09-16 NOTE — Telephone Encounter (Signed)
#  1 I did read the ER note thank you #2 please confirm with patient that she is not missing doses or taking it other than as prescribed in Epic #3 if she is taking it as prescribed in Epic increase potassium by the following-2 in the morning 2 at noon 2 in the evening. Repeat metabolic 7 and magnesium. May need office follow-up depending on the results of this. Do the lab testing early next week. Thank you.

## 2015-09-16 NOTE — Telephone Encounter (Signed)
Patient confirmed that she is taking her demadex and potassium as prescribed in EPIC Demadex 20 mg 3 tablets in the am and 2 tablets at noon Potassium 20 meq 2 tablets in am one tablet at noon and 2 tablets in pm-no missed doses. Patient advised to increase potassium to Potassium 20 meq 2 tablets in am 2 tablets at noon and 2 tablets in pm-repeat met 7 and mg early next week. Patient verbalized understanding. Rx sent electronically to pharmacy and bloodwork ordered in EPIC. Patient has follow up office visit scheduled 09/23/15 for ER follow up.

## 2015-09-16 NOTE — Telephone Encounter (Signed)
Patient is on torsemide and potassium thru [place] - nothing changed in ER -no mention of Potassium in ER note ER Doc REC: Pt is a 50 y.o. female with history of postpartum cardiomyopathy, hypertension, hyperlipidemia and previous tobacco use who presents to the emergency department with an episode of bilateral mid back pain that radiated into her chest. She had 2 separate episodes that lasted 5-10 minutes each. Patient reports that she felt that this was a tightness and felt like it was hard to take a deep breath during these episodes. Pain worsened with movement and movement of her arms. It is different than her chronic back pain. She is now completely pain-free.doubt dissection given she is pain-free. Denies history of ACS, PE. EKG shows no ischemic changes. Hemodynamically stable. She has had 2 negative troponins. Discussed with patient that we cannot rule out that this was cardiac in nature but there is no sign of acute coronary syndrome currently. Discussed with her that this may also be musculoskeletal but given the chest tightness and shortness of breath I would recommend further evaluation. I have recommended stress test in the near future and have offered her admission for rule out, stress test given her risk factors but patient and husband declined. She states she would like to be discharged home. Her PCP is Dr. Gerda Diss and she states she will call in the morning to schedule an appointment. Discussed with patient and husband if symptoms return to please call 911, take 4 aspirin at home and return to the emergency department. They verbalize understanding and are comfortable with this plan.

## 2015-09-16 NOTE — Telephone Encounter (Signed)
Nurse's-please talk with patient. Find out if she had her dose of potassium adjusted by the ER? How was she currently taking her potassium? How she currently taking her diuretic? We may well need to have her repeat lab work with follow-up office visit based around this information. More information necessary before I can give a final statement.

## 2015-09-16 NOTE — Telephone Encounter (Signed)
Pt called stating that she was recently in the hospital for low potassium that was causing muscle spasms around her heart. Pt states that she was told by the hospital to make you aware in case you wanted to see her for further evaluation. Please advise.

## 2015-09-16 NOTE — Telephone Encounter (Signed)
Please see ER note from 09/14/15

## 2015-09-19 ENCOUNTER — Encounter (HOSPITAL_COMMUNITY): Payer: Self-pay | Admitting: Emergency Medicine

## 2015-09-19 ENCOUNTER — Emergency Department (HOSPITAL_COMMUNITY)
Admission: EM | Admit: 2015-09-19 | Discharge: 2015-09-19 | Disposition: A | Discharge status: Home / Self Care | Payer: Medicaid Other | Attending: Emergency Medicine | Admitting: Emergency Medicine

## 2015-09-19 DIAGNOSIS — I509 Heart failure, unspecified: Secondary | ICD-10-CM | POA: Diagnosis not present

## 2015-09-19 DIAGNOSIS — Z87891 Personal history of nicotine dependence: Secondary | ICD-10-CM | POA: Insufficient documentation

## 2015-09-19 DIAGNOSIS — M62838 Other muscle spasm: Secondary | ICD-10-CM | POA: Insufficient documentation

## 2015-09-19 DIAGNOSIS — I10 Essential (primary) hypertension: Secondary | ICD-10-CM | POA: Insufficient documentation

## 2015-09-19 DIAGNOSIS — K219 Gastro-esophageal reflux disease without esophagitis: Secondary | ICD-10-CM | POA: Diagnosis not present

## 2015-09-19 DIAGNOSIS — E785 Hyperlipidemia, unspecified: Secondary | ICD-10-CM | POA: Insufficient documentation

## 2015-09-19 DIAGNOSIS — R252 Cramp and spasm: Secondary | ICD-10-CM | POA: Diagnosis present

## 2015-09-19 DIAGNOSIS — G43909 Migraine, unspecified, not intractable, without status migrainosus: Secondary | ICD-10-CM | POA: Insufficient documentation

## 2015-09-19 DIAGNOSIS — G8929 Other chronic pain: Secondary | ICD-10-CM | POA: Diagnosis not present

## 2015-09-19 DIAGNOSIS — Z79899 Other long term (current) drug therapy: Secondary | ICD-10-CM | POA: Insufficient documentation

## 2015-09-19 HISTORY — DX: Other chronic pain: G89.29

## 2015-09-19 HISTORY — DX: Pain in left hip: M25.552

## 2015-09-19 HISTORY — DX: Personal history of other medical treatment: Z92.89

## 2015-09-19 LAB — CBC WITH DIFFERENTIAL/PLATELET
Basophils Absolute: 0.1 10*3/uL (ref 0.0–0.1)
Basophils Relative: 0 %
EOS ABS: 0.2 10*3/uL (ref 0.0–0.7)
EOS PCT: 1 %
HCT: 47.7 % — ABNORMAL HIGH (ref 36.0–46.0)
HEMOGLOBIN: 15.9 g/dL — AB (ref 12.0–15.0)
LYMPHS ABS: 3.1 10*3/uL (ref 0.7–4.0)
Lymphocytes Relative: 19 %
MCH: 30 pg (ref 26.0–34.0)
MCHC: 33.3 g/dL (ref 30.0–36.0)
MCV: 90 fL (ref 78.0–100.0)
MONO ABS: 1 10*3/uL (ref 0.1–1.0)
MONOS PCT: 6 %
Neutro Abs: 12.1 10*3/uL — ABNORMAL HIGH (ref 1.7–7.7)
Neutrophils Relative %: 74 %
PLATELETS: 283 10*3/uL (ref 150–400)
RBC: 5.3 MIL/uL — ABNORMAL HIGH (ref 3.87–5.11)
RDW: 13.3 % (ref 11.5–15.5)
WBC: 16.4 10*3/uL — ABNORMAL HIGH (ref 4.0–10.5)

## 2015-09-19 LAB — COMPREHENSIVE METABOLIC PANEL
ALT: 20 U/L (ref 14–54)
AST: 21 U/L (ref 15–41)
Albumin: 4.2 g/dL (ref 3.5–5.0)
Alkaline Phosphatase: 68 U/L (ref 38–126)
Anion gap: 11 (ref 5–15)
BUN: 29 mg/dL — AB (ref 6–20)
CHLORIDE: 97 mmol/L — AB (ref 101–111)
CO2: 25 mmol/L (ref 22–32)
CREATININE: 1.64 mg/dL — AB (ref 0.44–1.00)
Calcium: 9.3 mg/dL (ref 8.9–10.3)
GFR calc Af Amer: 42 mL/min — ABNORMAL LOW (ref >60)
GFR calc non Af Amer: 36 mL/min — ABNORMAL LOW (ref >60)
GLUCOSE: 233 mg/dL — AB (ref 65–99)
Potassium: 4.5 mmol/L (ref 3.5–5.1)
SODIUM: 133 mmol/L — AB (ref 135–145)
Total Bilirubin: 0.6 mg/dL (ref 0.3–1.2)
Total Protein: 8.3 g/dL — ABNORMAL HIGH (ref 6.5–8.1)

## 2015-09-19 LAB — URINALYSIS, ROUTINE W REFLEX MICROSCOPIC
BILIRUBIN URINE: NEGATIVE
Glucose, UA: NEGATIVE mg/dL
Hgb urine dipstick: NEGATIVE
Ketones, ur: NEGATIVE mg/dL
NITRITE: NEGATIVE
PH: 5 (ref 5.0–8.0)
Protein, ur: NEGATIVE mg/dL
SPECIFIC GRAVITY, URINE: 1.015 (ref 1.005–1.030)

## 2015-09-19 LAB — URINE MICROSCOPIC-ADD ON

## 2015-09-19 LAB — TROPONIN I: Troponin I: <0.03 ng/mL (ref <0.031)

## 2015-09-19 LAB — MAGNESIUM: Magnesium: 1.9 mg/dL (ref 1.7–2.4)

## 2015-09-19 LAB — CK: CK TOTAL: 75 U/L (ref 38–234)

## 2015-09-19 MED ORDER — SODIUM CHLORIDE 0.9 % IV BOLUS (SEPSIS): 1000.0000 mL | Freq: Once | INTRAVENOUS | Status: DC

## 2015-09-19 MED ORDER — FENTANYL CITRATE (PF) 100 MCG/2ML IJ SOLN
50.0000 ug | INTRAMUSCULAR | Status: DC | PRN
  Administered 2015-09-19: 50 ug via INTRAVENOUS
  Filled 2015-09-19: qty 2

## 2015-09-19 MED ORDER — METHOCARBAMOL 500 MG PO TABS: 1000.0000 mg | ORAL_TABLET | Freq: Four times a day (QID) | ORAL | Status: DC | PRN

## 2015-09-19 MED ORDER — DIAZEPAM 5 MG PO TABS
5.0000 mg | ORAL_TABLET | Freq: Once | ORAL | Status: AC
  Administered 2015-09-19: 5 mg via ORAL
  Filled 2015-09-19: qty 1

## 2015-09-19 MED ORDER — SODIUM CHLORIDE 0.9 % IV BOLUS (SEPSIS)
1000.0000 mL | Freq: Once | INTRAVENOUS | Status: AC
  Administered 2015-09-19: 1000 mL via INTRAVENOUS

## 2015-09-19 NOTE — ED Notes (Signed)
Patient ambulatory to restroom without difficulty. 

## 2015-09-19 NOTE — ED Provider Notes (Signed)
CSN: [id number]     Arrival date & time 09/19/15  1538 History   First MD Initiated Contact with Patient 09/19/15 1555     Chief Complaint  Patient presents with  . Spasms      HPI  Pt was seen at 1605. Per pt, c/o gradual onset and persistence of constant "muscles spasms all over" that began last night. Pt states she ws evaluated in the ED 4 days ago for back pain, and was dx low potassium and dehydration. Pt states she has increased her potassium to 6 tabs/day. Pt states she is concerned "my potassium is low again." Denies specific abd pain, no N/V/D, no CP/palpitations, no SOB/cough, no focal motor weakness, no tingling/numbness in extremities, no rash, no fevers, no injury.    Past Medical History  Diagnosis Date  . Hypertension   . Anxiety and depression   . Cardiomyopathy 2004    mild, post-partum  . Abdominal pain     Unspecified site  . Hyperlipidemia   . Anxiety   . Depression   . GERD (gastroesophageal reflux disease)   . Chronic back pain   . Migraine headache without aura   . Gastritis   . H/O echocardiogram 2012    EF 55-60%  . CHF (congestive heart failure) (HCC) 2004    post-partum   Past Surgical History  Procedure Laterality Date  . Lumbar disc surgery  2010    [place], Dr Marikay Alar  . Childbirth      time 3  . Cholecystectomy  2005    [facility] , Dr Katrinka Blazing  . Lumbar disc surgery  2005    [place], Dr Marikay Alar   Family History  Problem Relation Age of Onset  . Heart attack Father   . Anesthesia problems Neg Hx   . Hypotension Neg Hx   . Malignant hyperthermia Neg Hx   . Pseudochol deficiency Neg Hx    Social History  Substance Use Topics  . Smoking status: Former Smoker -- 0.25 packs/day for 15 years    Types: Cigarettes    Quit date: 08/23/2013  . Smokeless tobacco: Never Used  . Alcohol Use: No   OB History    Gravida Para Term Preterm AB TAB SAB Ectopic Multiple Living   3 3 3       3      Review of Systems ROS: Statement:  All systems negative except as marked or noted in the HPI; Constitutional: Negative for fever and chills. ; ; Eyes: Negative for eye pain, redness and discharge. ; ; ENMT: Negative for ear pain, hoarseness, nasal congestion, sinus pressure and sore throat. ; ; Cardiovascular: Negative for chest pain, palpitations, diaphoresis, dyspnea and peripheral edema. ; ; Respiratory: Negative for cough, wheezing and stridor. ; ; Gastrointestinal: Negative for nausea, vomiting, diarrhea, abdominal pain, blood in stool, hematemesis, jaundice and rectal bleeding. . ; ; Genitourinary: Negative for dysuria, flank pain and hematuria. ; ; Musculoskeletal: +"muscles cramping all over." Negative for back pain and neck pain. Negative for swelling and trauma.; ; Skin: Negative for pruritus, rash, abrasions, blisters, bruising and skin lesion.; ; Neuro: Negative for headache, lightheadedness and neck stiffness. Negative for weakness, altered level of consciousness , altered mental status, extremity weakness, paresthesias, involuntary movement, seizure and syncope.      Allergies  Cefzil; Sulfa antibiotics; Augmentin; and Avelox  Home Medications   Prior to Admission medications   Medication Sig Start Date End Date Taking? Authorizing Provider  cetirizine (  ZYRTEC) 10 MG tablet TAKE ONE TABLET BY MOUTH AT BEDTIME AS NEEDED FOR ALLERGY Patient taking differently: TAKE ONE TABLET BY MOUTH AT BEDTIME 06/24/15  Yes Babs Sciara, MD  chlorzoxazone (PARAFON) 500 MG tablet TAKE ONE TABLET BY MOUTH THREE TIMES DAILY AS NEEDED FOR MUSCLE SPASM 06/24/15  Yes Babs Sciara, MD  fluticasone (FLONASE) 50 MCG/ACT nasal spray Place 2 sprays into both nostrils daily. Patient taking differently: Place 2 sprays into both nostrils daily as needed for allergies or rhinitis.  02/25/15  Yes Babs Sciara, MD  HYDROcodone-acetaminophen (NORCO/VICODIN) 5-325 MG tablet One tid prn prn pain Patient taking differently: Take 1 tablet by mouth 3  (three) times daily as needed for moderate pain or severe pain. One tid prn prn pain 07/13/15  Yes Babs Sciara, MD  lisinopril (PRINIVIL,ZESTRIL) 5 MG tablet TAKE ONE TABLET BY MOUTH ONCE DAILY NEEDS  OFFICE  VISIT Patient taking differently: TAKE ONE TABLET BY MOUTH ONCE DAILY 06/24/15  Yes Babs Sciara, MD  Menthol, Topical Analgesic, (ICY HOT) 7.5 % (Roll) MISC Apply 1 each topically as needed (Pain).   Yes Historical Provider, MD  pantoprazole (PROTONIX) 40 MG tablet TAKE ONE TABLET BY MOUTH ONCE DAILY NEEDS  OFFICE  VISIT Patient taking differently: TAKE ONE TABLET BY MOUTH ONCE DAILY 06/24/15  Yes Babs Sciara, MD  potassium chloride SA (KLOR-CON M20) 20 MEQ tablet Take 2 tablets qam 2 tablets at noon and 2 in the evening. Patient taking differently: Take 40 mEq by mouth 3 (three) times daily.  09/16/15  Yes Babs Sciara, MD  pregabalin (LYRICA) 75 MG capsule Take 1 capsule (75 mg total) by mouth 2 (two) times daily. 07/13/15  Yes Babs Sciara, MD  simvastatin (ZOCOR) 20 MG tablet TAKE ONE TABLET BY MOUTH ONCE DAILY NEEDS  OFFICE  VISIT Patient taking differently: TAKE ONE TABLET BY MOUTH ONCE DAILY 06/24/15  Yes Babs Sciara, MD  torsemide (DEMADEX) 20 MG tablet TAKE THREE TABLETS BY MOUTH IN THE MORNING AND TWO AT NOON Patient taking differently: Take 40-60 mg by mouth 2 (two) times daily. TAKE THREE TABLETS BY MOUTH IN THE MORNING AND TWO AT NOON 02/09/15  Yes Babs Sciara, MD   BP 91/59 mmHg  Pulse 83  Temp(Src) 98.3 F (36.8 C) (Temporal)  Resp 22  Ht 5\' 8"  (1.727 m)  Wt 197 lb (89.359 kg)  BMI 29.96 kg/m2  SpO2 100%   17:22:53 Orthostatic Vital Signs CK  Orthostatic Lying  - BP- Lying: 99/61 mmHg ; Pulse- Lying: 79  Orthostatic Sitting - BP- Sitting: 94/60 mmHg ; Pulse- Sitting: 85  Orthostatic Standing at 0 minutes - BP- Standing at 0 minutes: 95/69 mmHg ; Pulse- Standing at 0 minutes: 87       Physical Exam  1610: Physical examination:  Nursing notes  reviewed; Vital signs and O2 SAT reviewed;  Constitutional: Well developed, Well nourished, Well hydrated, In no acute distress; Head:  Normocephalic, atraumatic; Eyes: EOMI, PERRL, No scleral icterus; ENMT: Mouth and pharynx normal, Mucous membranes moist; Neck: Supple, Full range of motion, No lymphadenopathy; Cardiovascular: Regular rate and rhythm, No murmur, rub, or gallop; Respiratory: Breath sounds clear & equal bilaterally, No rales, rhonchi, wheezes.  Speaking full sentences with ease, Normal respiratory effort/excursion; Chest: Nontender, Movement normal; Abdomen: Soft, Nontender, Nondistended, Normal bowel sounds; Genitourinary: No CVA tenderness; Spine:  No midline CS, TS, LS tenderness. +TTP bilat lumbar paraspinal muscles. No rash.;; Extremities: Pulses normal, No tenderness,  No edema, No calf edema or asymmetry. No palp or visual muscle spasms during exam. Bilat UE's and LE's muscles compartments soft.; Neuro: AA&Ox3, Major CN grossly intact.  Speech clear. No gross focal motor or sensory deficits in extremities. Climbs on and off stretcher easily by herself. Gait steady.; Skin: Color normal, Warm, Dry.   ED Course  Procedures (including critical care time) Labs Review   Imaging Review I have personally reviewed and evaluated these images and lab results as part of my medical decision-making.   EKG Interpretation   Date/Time:  Sunday September 19 2015 16:54:57 EST Ventricular Rate:  80 PR Interval:  165 QRS Duration: 102 QT Interval:  362 QTC Calculation: 418 R Axis:   77 Text Interpretation:  Sinus rhythm Baseline wander Artifact When compared  with ECG of 09/14/2015 Baseline wander is now Present Confirmed by [facility]   MD, Nicholos Johns [id number]) on 09/19/2015 5:17:52 PM      MDM  MDM Reviewed: previous chart, nursing note and vitals Reviewed previous: labs, ECG and x-ray Interpretation: labs and ECG   Results for orders placed or performed during the hospital encounter of  09/19/15  Comprehensive metabolic panel  Result Value Ref Range   Sodium 133 (L) 135 - 145 mmol/L   Potassium 4.5 3.5 - 5.1 mmol/L   Chloride 97 (L) 101 - 111 mmol/L   CO2 25 22 - 32 mmol/L   Glucose, Bld 233 (H) 65 - 99 mg/dL   BUN 29 (H) 6 - 20 mg/dL   Creatinine, Ser 6.04 (H) 0.44 - 1.00 mg/dL   Calcium 9.3 8.9 - 54.0 mg/dL   Total Protein 8.3 (H) 6.5 - 8.1 g/dL   Albumin 4.2 3.5 - 5.0 g/dL   AST 21 15 - 41 U/L   ALT 20 14 - 54 U/L   Alkaline Phosphatase 68 38 - 126 U/L   Total Bilirubin 0.6 0.3 - 1.2 mg/dL   GFR calc non Af Amer 36 (L) >60 mL/min   GFR calc Af Amer 42 (L) >60 mL/min   Anion gap 11 5 - 15  CBC with Differential  Result Value Ref Range   WBC 16.4 (H) 4.0 - 10.5 K/uL   RBC 5.30 (H) 3.87 - 5.11 MIL/uL   Hemoglobin 15.9 (H) 12.0 - 15.0 g/dL   HCT 98.1 (H) 19.1 - 47.8 %   MCV 90.0 78.0 - 100.0 fL   MCH 30.0 26.0 - 34.0 pg   MCHC 33.3 30.0 - 36.0 g/dL   RDW 29.5 62.1 - 30.8 %   Platelets 283 150 - 400 K/uL   Neutrophils Relative % 74 %   Neutro Abs 12.1 (H) 1.7 - 7.7 K/uL   Lymphocytes Relative 19 %   Lymphs Abs 3.1 0.7 - 4.0 K/uL   Monocytes Relative 6 %   Monocytes Absolute 1.0 0.1 - 1.0 K/uL   Eosinophils Relative 1 %   Eosinophils Absolute 0.2 0.0 - 0.7 K/uL   Basophils Relative 0 %   Basophils Absolute 0.1 0.0 - 0.1 K/uL  CK  Result Value Ref Range   Total CK 75 38 - 234 U/L  Magnesium  Result Value Ref Range   Magnesium 1.9 1.7 - 2.4 mg/dL  Troponin I  Result Value Ref Range   Troponin I <0.03 <0.031 ng/mL  Urinalysis, Routine w reflex microscopic  Result Value Ref Range   Color, Urine YELLOW YELLOW   APPearance CLEAR CLEAR   Specific Gravity, Urine 1.015 1.005 - 1.030   pH  5.0 5.0 - 8.0   Glucose, UA NEGATIVE NEGATIVE mg/dL   Hgb urine dipstick NEGATIVE NEGATIVE   Bilirubin Urine NEGATIVE NEGATIVE   Ketones, ur NEGATIVE NEGATIVE mg/dL   Protein, ur NEGATIVE NEGATIVE mg/dL   Nitrite NEGATIVE NEGATIVE   Leukocytes, UA TRACE (A) NEGATIVE   Urine microscopic-add on  Result Value Ref Range   Squamous Epithelial / LPF 6-30 (A) NONE SEEN   WBC, UA 0-5 0 - 5 WBC/hpf   RBC / HPF 0-5 0 - 5 RBC/hpf   Bacteria, UA FEW (A) NONE SEEN   Dg Chest 2 View 09/14/2015  CLINICAL DATA:  Shortness of breath. EXAM: CHEST  2 VIEW COMPARISON:  October 27, 2010. FINDINGS: The heart size and mediastinal contours are within normal limits. Both lungs are clear. No pneumothorax or pleural effusion is noted. The visualized skeletal structures are unremarkable. IMPRESSION: No active cardiopulmonary disease. Electronically Signed   By: Lupita Raider, M.D.   On: 09/14/2015 21:38    Results for KAMAREE, BERKEL (MRN [id number]) as of 09/19/2015 17:29  Ref. Range 07/23/2014 09:18 07/17/2015 09:34 09/14/2015 21:47 09/19/2015 16:23  BUN Latest Ref Range: 6-20 mg/dL (H) 29 (H)  Creatinine Latest Ref Range: 0.44-1.00 mg/dL 1.47 8.29 (H) 5.62 (H) 1.64 (H)    1825:  Pt is not orthostatic on VS. VS near baseline per EPIC chart review. BUN/Cr mildly elevated from previous; hx normal EF on Echo in 2012, IVF given. Workup is otherwise reassuring. No clear indication for admission at this time. Pt has ambulated with steady gait, easy resps, NAD, no complaints. Pt has tol PO well without N/V. Pt feels better after valium and fentanyl and wants to go home now. Pt requesting pain medication. EPIC chart reviewed: pt receives her chronic narcotic prescriptions from Dr. Gerda Diss. [place] Controlled Substance Database accessed:  Pt's name on this chart is not found in database, however, "Pamela Mendez" was found, with Dr. Fletcher Anon multiple hydrocodone/APAP prescription, most recently filled 09/08/2015 (#90). Will not rx further narcotic meds. Pt aware I will rx a different muscle relaxer. Dx and testing d/w pt and family.  Questions answered.  Verb understanding, agreeable to d/c home with outpt f/u.   Samuel Jester, DO 09/22/15 1705

## 2015-09-19 NOTE — ED Notes (Signed)
Patient given sprite to drink. 

## 2015-09-19 NOTE — ED Notes (Signed)
Pt requesting medication for pain. MD Clarene Duke notified.

## 2015-09-19 NOTE — ED Notes (Signed)
Patient c/o muscle spasms all over body. Patient seen here on Tuesday for same reason but with shortness of breath. Patient has hx of CHF. Patient states she was diagnosed with dehydrations and low potassium. Patient's potassium increased to 6 tablets of potassium. Patient does take Lasix. Patient was to see Dr Gerda Diss in the morning for repeat labs. Patient does report some shortness of breath but states only with spasms.

## 2015-09-19 NOTE — Discharge Instructions (Signed)
°Emergency Department Resource Guide °1) Find a Doctor and Pay Out of Pocket °Although you won't have to find out who is covered by your insurance plan, it is a good idea to ask around and get recommendations. You will then need to call the office and see if the doctor you have chosen will accept you as a new patient and what types of options they offer for patients who are self-pay. Some doctors offer discounts or will set up payment plans for their patients who do not have insurance, but you will need to ask so you aren't surprised when you get to your appointment. ° °2) Contact Your Local Health Department °Not all health departments have doctors that can see patients for sick visits, but many do, so it is worth a call to see if yours does. If you don't know where your local health department is, you can check in your phone book. The CDC also has a tool to help you locate your state's health department, and many state websites also have listings of all of their local health departments. ° °3) Find a Walk-in Clinic °If your illness is not likely to be very severe or complicated, you may want to try a walk in clinic. These are popping up all over the country in pharmacies, drugstores, and shopping centers. They're usually staffed by nurse practitioners or physician assistants that have been trained to treat common illnesses and complaints. They're usually fairly quick and inexpensive. However, if you have serious medical issues or chronic medical problems, these are probably not your best option. ° °No Primary Care Doctor: °- Call [facility] at  832-8000 - they can help you locate a primary care doctor that  accepts your insurance, provides certain services, etc. °- Physician Referral Service- 1-800-533-3463 ° °Chronic Pain Problems: °Organization         Address  Phone   Notes  °[facility]  (336) 297-2271 Patients need to be referred by their primary care doctor.  ° °Medication  Assistance: °Organization         Address  Phone   Notes  °[place] [facility] [address] °[place], [place] [address] (336) 641-8030 --Must be a resident of [place] °-- Must have NO insurance coverage whatsoever (no Medicaid/ Medicare, etc.) °-- The pt. MUST have a primary care doctor that directs their care regularly and follows them in the community °  °[facility]  (866) 331-1348   °[employer]  (888) 892-1162   ° °Agencies that provide inexpensive medical care: °Organization         Address  Phone   Notes  °[facility]  (336) 832-8035   °[place]    (336) 832-7272   °[facility] [address] °[place], [place] [address] (336) 832-4777   °[facility] [address], °[place] (336) 271-4999   °[facility]    (336) 373-0678   °[facility]    (336) 272-1050   °[facility] ° [address], [place] Phone:  (336) 832-4444, Fax:  (336) 832-4440 Hours of Operation:  9 am - 6 pm, M-F.  Also accepts Medicaid/Medicare and self-pay.  ° [facility] ° [address],  Phone: (336) 832-3150, Fax: (336) 832-3151. Hours of Operation:  8:30 am - 5:30 pm, M-F.  Also accepts Medicaid and self-pay.  °[facility] [place] [address], [place] Phone: (336) 878-6027   °[facility] [address], [place], [place] (336)723-1848, Ext. 123 Mondays & Thursdays: 7-9 AM.  First 15 patients are seen on a first come, first serve basis. °  ° °Medicaid-accepting [place] Providers: ° °Organization         Address  Phone   Notes  °[facility] [address], [place] (336) 641-2100 Also accepts self-pay patients.  °[facility] [address], [place] ° (336) 856-9996   °[facility] [address], [place]  (336) 288-8857   °[facility] [address] [place] [address], [place] (336) 299-7000   °Veita Bland [address], [place]  ° (336) 373-1557 Only accepts [place] Access Medicaid patients after they have their name applied to their card.  ° °Self-Pay (no insurance) in [place]: ° °Organization         Address  Phone   Notes  °Sickle Cell Patients, [facility] [address], [place] (336) 832-1970   °[facility] [address], [place] (336) 832-4400   °[facility] ° [address],  (336) 992-4800   °[facility]/Dr. Osei-Bonsu ° [address] [place] [address], [place] or [address], [place] (336) 841-8500 Phone number for both [place] and [place] locations is the same.  °[facility] [address], [place] (336) 299-0000   °[facility] [address] [place] [address], [place] or [address] (336) 852-7530 °(336) 878-2260   °[facility] [address], [place] (336) 350-1642, phone; (336) 294-5005, fax Sees patients 1st and 3rd Saturday of every month.  Must not qualify for public or private insurance (i.e. Medicaid, Medicare, [place] Health Choice, Veterans' Benefits) • Household income should be no more than 200% of the poverty level •The clinic cannot treat you if you are pregnant or think you are pregnant • Sexually transmitted diseases are not treated at the clinic.  ° ° °Dental Care: °Organization         Address  Phone  Notes  °[place] [facility] [address], [place] (336) 641-6152 Accepts children up to age 21 who are enrolled in Medicaid or [place] Health Choice; pregnant women with a Medicaid card; and children who have applied for Medicaid or [place] Health Choice, but were declined, whose parents can pay a reduced fee at time of service.  °[place]  [facility] [place]  [address], [place] (336) 641-7733 Accepts children up to age 21 who are enrolled in Medicaid or [place] Health Choice; pregnant women with a Medicaid card; and children who have applied for Medicaid or [place] Health Choice, but were declined, whose parents can pay a reduced fee at time of service.  °[facility] ° [address], [place] (336) 641-4533 Patients are seen by appointment only. Walk-ins are not accepted. [facility] will see patients 18 years of age and older. °Monday - Tuesday (8am-5pm) °Most Wednesdays (8:30-5pm) °$30 per visit, cash only  °[facility] ° [address], [place] (336) 641-4533 Patients are seen by appointment only. Walk-ins are not accepted. [facility] will see patients 18 years of age and older. °One   Wednesday Evening (Monthly: Volunteer Based).  $30 per visit, cash only  °[facility]  (919) 537-3737 for adults; Children under age 4, call Graduate Pediatric Dentistry at (919) [phone]. Children aged 4-14, please call (919) [phone] to request a pediatric application. ° Dental services are provided in all areas of dental care including fillings, crowns and bridges, complete and partial dentures, implants, gum treatment, root canals, and extractions. Preventive care is also provided. Treatment is provided to both adults and children. °Patients are selected via a lottery and there is often a waiting list. °  °[facility] [address], °[place] ° (336) 763-8833 [email or web address] °  °[facility] [address], [place], [place] (336)723-1848, Ext. 123 Second and Fourth Thursday of each month, opens at 6:30 AM; Clinic ends at 9 AM.  Patients are seen on a first-come first-served basis, and a limited number are seen during each clinic.  ° °[facility] ° [address], [place], [place] (336) 723-7904    Eligibility Requirements °You must have lived in [place], [place], or [place] counties for at least the last three months. °  You cannot be eligible for state or federal sponsored healthcare insurance, including Veterans Administration, Medicaid, or Medicare. °  You generally cannot be eligible for healthcare insurance through your employer.  °  How to apply: °Eligibility screenings are held every Tuesday and Wednesday afternoon from 1:00 pm until 4:00 pm. You do not need an appointment for the interview!  °[facility] [address], [place], [place] 336-631-2330   °[facility] [facility]  336-342-8273   °[facility]  336-703-3100   °[facility]  336-570-6415   ° °Behavioral Health Resources in the Community: °Intensive Outpatient Programs °Organization         Address  Phone  Notes  °[facility] [address], [place], [place] 336-878-6098   °[facility] [address], [place], [place] 336-832-9800   °[facility] [address], [place], [place] ° 336-882-2125   °[facility] [address],  °[place], [place] 1-800-853-5163 or 336-641-4981   °Substance Abuse Resources °Organization         Address  Phone  Notes  °[facility]  336-882-2125   °[facility]  336-784-9470   °[facility]  336-285-9073   °[facility]  336-845-3988   °Residential & Outpatient Substance Abuse Program  1-800-659-3381   °Psychological Services °Organization         Address  Phone  Notes  °[facility]  336- 832-9600   °[facility]  336- 378-7881   °[facility] [address], [place] 1-800-853-5163 or 336-641-4981   ° °Mobile Crisis Teams °Organization         Address  Phone  Notes  °[facility], Mobile Crisis Care Unit  1-877-626-1772   °Assertive °[facility] ° [address]  [place], [place] 336-834-9664   °Sharon DeEsch [address] °[place] 336-554-5454   ° °Self-Help/Support Groups °Organization         Address  Phone             Notes  °[facility]  - variety of support groups  336- 373-1402 Call for more information  °Narcotics Anonymous (NA), [facility] [address], °[place]  2 meetings at this location  ° °  Residential Treatment Programs °Organization         Address  Phone  Notes  °[facility] [address],    °[place]  1-866-801-8205   °[facility] ° [address], [place], [place] 704-293-8524   °[facility] [address], [place] 336-845-3988 Admissions: 8am-3pm M-F  °[facility] [address]    °[place], [place] 336-841-1104   °[facility] [address], [place], [place] 336-379-7146   °[facility] [address]  °[place], [place] 336-285-9073   °[facility] Intensive Outpatient [address], [place], [place] 336-852-3033   °[facility] [address]  °[place], [place] 1-877-615-2722 or 336-784-9470   °[facility] [address] [place], [place] 336-227-7417 Accepts Medicaid  °[facility] [address]  °[place] 1-800-659-3381 Substance Abuse/Addiction Treatment  ° °[place] Behavioral Health Resources °Organization         Address  Phone  Notes  °[facility]  (888) 581-9988   °Julie Brannon, PhD [address] [place], [place]   (336) 349-5553 or (336) 951-0000   °[facility]   [address] °[place], [place] (336) 349-4454   °[facility] [address], [place], [place] (336) 342-8316 Insurance/Medicaid/sponsorship through [facility]  °[facility] [address]                                    [place], [place] (336) 342-8316 Therapy/tele-psych/case    °[facility] [address]  ° [place], [place] (336) 349-2233    °Dr. Arfeen  (336) 349-4544   °[facility] [place]  [employer] [place] [facility] 1) [address], [place] °2) [address], [place] °3)  [address], [place] (336) 349-3220 °(336) 342-7768 ° °(336) 342-8140   °[place] [facility] (336) 342-1394 or (336) 342-3537 (After Hours)    ° ° °Take the prescription as directed.  Apply moist heat or ice to the area(s) of discomfort, for 15 minutes at a time, several times per day for the next few days.  Do not fall asleep on a heating or ice pack.  Call your regular medical doctor tomorrow to schedule a follow up appointment in the next 2 days.  Return to the Emergency Department immediately if worsening. ° °

## 2015-09-20 ENCOUNTER — Telehealth: Payer: Self-pay | Admitting: Family Medicine

## 2015-09-20 NOTE — Telephone Encounter (Signed)
Patient has to go back to ER last night and she believes her issue are all coming form the hip injection that she had done on 09/07/15.  She has a really bad bruise and her white cell count was up when she went back to ER last night.  She said they didn't give her anything last night, but would like to know your opinion.  Please advise.

## 2015-09-20 NOTE — Telephone Encounter (Signed)
Patient has appt for ER follow up 09/23/15

## 2015-09-21 NOTE — Telephone Encounter (Signed)
I would not recommend adding other medications currently. When the patient comes for a follow-up office visit in a couple days please have her bring all of her medicines bottles in a bag

## 2015-09-21 NOTE — Telephone Encounter (Signed)
Discussed with patient. Patient advised Dr Lorin Picket would not recommend adding other medications currently. When the patient comes for a follow-up office visit in a couple days please have her bring all of her medicines bottles in a bag. Patient has a follow up appt 09/23/15. Patient verbalized understanding.

## 2015-09-23 ENCOUNTER — Ambulatory Visit (INDEPENDENT_AMBULATORY_CARE_PROVIDER_SITE_OTHER): Payer: Medicaid Other | Admitting: Family Medicine

## 2015-09-23 ENCOUNTER — Encounter: Payer: Self-pay | Admitting: Family Medicine

## 2015-09-23 VITALS — BP 112/80 | Ht 66.0 in | Wt 201.5 lb

## 2015-09-23 DIAGNOSIS — D72829 Elevated white blood cell count, unspecified: Secondary | ICD-10-CM

## 2015-09-23 DIAGNOSIS — J019 Acute sinusitis, unspecified: Secondary | ICD-10-CM

## 2015-09-23 DIAGNOSIS — E876 Hypokalemia: Secondary | ICD-10-CM

## 2015-09-23 DIAGNOSIS — B9689 Other specified bacterial agents as the cause of diseases classified elsewhere: Secondary | ICD-10-CM

## 2015-09-23 LAB — MAGNESIUM: MAGNESIUM: 2.3 mg/dL (ref 1.6–2.3)

## 2015-09-23 LAB — BASIC METABOLIC PANEL
BUN/Creatinine Ratio: 17 (ref 9–23)
BUN: 20 mg/dL (ref 6–24)
CALCIUM: 9.6 mg/dL (ref 8.7–10.2)
CHLORIDE: 100 mmol/L (ref 96–106)
CO2: 23 mmol/L (ref 18–29)
Creatinine, Ser: 1.19 mg/dL — ABNORMAL HIGH (ref 0.57–1.00)
GFR calc Af Amer: 62 mL/min/{1.73_m2} (ref >59)
GFR calc non Af Amer: 54 mL/min/{1.73_m2} — ABNORMAL LOW (ref >59)
GLUCOSE: 117 mg/dL — AB (ref 65–99)
POTASSIUM: 4.2 mmol/L (ref 3.5–5.2)
SODIUM: 139 mmol/L (ref 134–144)

## 2015-09-23 MED ORDER — DOXYCYCLINE HYCLATE 100 MG PO CAPS: 100.0000 mg | ORAL_CAPSULE | Freq: Two times a day (BID) | ORAL | Status: DC

## 2015-09-23 MED ORDER — METHOCARBAMOL 500 MG PO TABS: 500.0000 mg | ORAL_TABLET | Freq: Four times a day (QID) | ORAL | Status: DC | PRN

## 2015-09-23 NOTE — Patient Instructions (Signed)
Reduce diuretic to 2 in the am 2 later in the day  Continue potassium  Recheck here in 2 weeks  Check potassium in 1 week

## 2015-09-23 NOTE — Progress Notes (Signed)
   Subjective:    Patient ID: Pamela Mendez, female    DOB: Jan 03, 1966, 50 y.o.   MRN: [id number]  HPI Patient in today for ER follow up from 09/14/15 and 09/19/15. Patient was seen for muscle spasms.   Patient states believes that the symptoms of muscle spasms an low potassium was triggered from hip injection 09/07/15. Muscle spasms started shortly after.  patient had numerous questions regarding her potassium regarding her diuretic regarding muscle spasms and feeling frustrated about her ER visit she states that she also is frustrated with recent injections that she had with her specialist that it did not help her. Greater than half the time spent with this patient in discussion of these issues 992 09/08/2023 minutes spent with patient  Review of Systems  she denies any chest tightness pressure pain shortness breath nausea vomiting diarrhea or palpitation she does relate low back pain bilateral hip pain she also relates spasms in the legs as well as in the back as well as in the chest. Denies hemoptysis fevers or rectal bleeding. She does relate head congestion drainage coughing sinus pressure    Objective:   Physical Exam  eardrums normal moderate sinus tenderness throat is normal neck no masses lungs are clear no crackles heart is regular abdomen is soft extremities no edema subjective discomfort into her legs       Assessment & Plan:  1. Hypokalemia Recent lab work looks good continue current measures reduce the diuretic to 2 in the morning and 2 at noontime. Blood pressure relatively low at 96/60. We will recheck her again a couple weeks time. Check lab work. - CBC with Differential/Platelet - Potassium - Magnesium  2. Leukocytosis Leukocytosis is probably related to the injection of steroids. We will check CBC. Await the results of all this. - CBC with Differential/Platelet - Potassium - Magnesium  3. Acute bacterial rhinosinusitis Sinusitis infection antibiotics prescribed  warning signs discussed follow-up if ongoing trouble  Patient does have intermittent muscle spasms I encouraged not to overuse muscle relaxers. Robaxin was prescribed.  Diuretic use as time moves forward if possible we will try to reduce the amount of diuretic that she is taking - CBC with Differential/Platelet - Potassium - Magnesium

## 2015-10-05 LAB — CBC WITH DIFFERENTIAL/PLATELET
Basophils Absolute: 0.1 10*3/uL (ref 0.0–0.2)
Basos: 1 %
EOS (ABSOLUTE): 0.2 10*3/uL (ref 0.0–0.4)
EOS: 2 %
Hematocrit: 44.8 % (ref 34.0–46.6)
Hemoglobin: 15.1 g/dL (ref 11.1–15.9)
IMMATURE GRANS (ABS): 0.1 10*3/uL (ref 0.0–0.1)
IMMATURE GRANULOCYTES: 1 %
LYMPHS: 29 %
Lymphocytes Absolute: 3.1 10*3/uL (ref 0.7–3.1)
MCH: 29.5 pg (ref 26.6–33.0)
MCHC: 33.7 g/dL (ref 31.5–35.7)
MCV: 88 fL (ref 79–97)
MONOS ABS: 0.9 10*3/uL (ref 0.1–0.9)
Monocytes: 8 %
NEUTROS PCT: 59 %
Neutrophils Absolute: 6.5 10*3/uL (ref 1.4–7.0)
PLATELETS: 288 10*3/uL (ref 150–379)
RBC: 5.11 x10E6/uL (ref 3.77–5.28)
RDW: 14 % (ref 12.3–15.4)
WBC: 10.7 10*3/uL (ref 3.4–10.8)

## 2015-10-05 LAB — MAGNESIUM: MAGNESIUM: 1.7 mg/dL (ref 1.6–2.3)

## 2015-10-05 LAB — POTASSIUM: Potassium: 3.6 mmol/L (ref 3.5–5.2)

## 2015-10-07 ENCOUNTER — Encounter: Payer: Self-pay | Admitting: Family Medicine

## 2015-10-07 ENCOUNTER — Ambulatory Visit (INDEPENDENT_AMBULATORY_CARE_PROVIDER_SITE_OTHER): Payer: Medicaid Other | Admitting: Family Medicine

## 2015-10-07 ENCOUNTER — Telehealth: Payer: Self-pay | Admitting: Family Medicine

## 2015-10-07 VITALS — BP 122/80 | Ht 66.0 in | Wt 202.0 lb

## 2015-10-07 DIAGNOSIS — E876 Hypokalemia: Secondary | ICD-10-CM | POA: Diagnosis not present

## 2015-10-07 DIAGNOSIS — J019 Acute sinusitis, unspecified: Secondary | ICD-10-CM

## 2015-10-07 DIAGNOSIS — M549 Dorsalgia, unspecified: Secondary | ICD-10-CM

## 2015-10-07 DIAGNOSIS — J309 Allergic rhinitis, unspecified: Secondary | ICD-10-CM | POA: Diagnosis not present

## 2015-10-07 DIAGNOSIS — G8929 Other chronic pain: Secondary | ICD-10-CM

## 2015-10-07 DIAGNOSIS — R601 Generalized edema: Secondary | ICD-10-CM | POA: Diagnosis not present

## 2015-10-07 DIAGNOSIS — B9689 Other specified bacterial agents as the cause of diseases classified elsewhere: Secondary | ICD-10-CM

## 2015-10-07 MED ORDER — CLINDAMYCIN HCL 300 MG PO CAPS: 300.0000 mg | ORAL_CAPSULE | Freq: Three times a day (TID) | ORAL | Status: DC

## 2015-10-07 MED ORDER — CETIRIZINE HCL 10 MG PO TABS: ORAL_TABLET | ORAL | Status: DC

## 2015-10-07 MED ORDER — SIMVASTATIN 20 MG PO TABS: ORAL_TABLET | ORAL | Status: DC

## 2015-10-07 MED ORDER — HYDROCODONE-ACETAMINOPHEN 5-325 MG PO TABS: ORAL_TABLET | ORAL | Status: DC

## 2015-10-07 MED ORDER — TORSEMIDE 20 MG PO TABS: ORAL_TABLET | ORAL | Status: DC

## 2015-10-07 MED ORDER — PREGABALIN 75 MG PO CAPS: 75.0000 mg | ORAL_CAPSULE | Freq: Two times a day (BID) | ORAL | Status: DC

## 2015-10-07 MED ORDER — FLUTICASONE PROPIONATE 50 MCG/ACT NA SUSP: 2.0000 | Freq: Every day | NASAL | Status: DC

## 2015-10-07 MED ORDER — LISINOPRIL 5 MG PO TABS: ORAL_TABLET | ORAL | Status: DC

## 2015-10-07 MED ORDER — POTASSIUM CHLORIDE CRYS ER 20 MEQ PO TBCR: EXTENDED_RELEASE_TABLET | ORAL | Status: DC

## 2015-10-07 MED ORDER — PANTOPRAZOLE SODIUM 40 MG PO TBEC: DELAYED_RELEASE_TABLET | ORAL | Status: DC

## 2015-10-07 NOTE — Progress Notes (Signed)
   Subjective:    Patient ID: Pamela Mendez, female    DOB: [birth year], 50 y.o.   MRN: [id number]  HPIFollow up on hypokalemia. Had bw done on 1/30. Pt states she is feeling better.   patient with intermittent muscle cramps that she attributes to low potassium recent potassium borderline  she's been doing well with minimal swelling she states by the end of the day she does have some swelling in her lower legs   she denies any dizziness or sweats or chills She does have chronic back pain she uses pain medicine for this she denies abusing it. She states she takes anywhere between 2 and 3 per day she does need refills on pain medicine. Finished doxy. Still having Sinus drainage, sinus pressure, low grade fever off and on.   patient with increased congestion drainage sinus pressure denies high fever chills sweats   Review of Systems  Constitutional: Negative for fever and activity change.  HENT: Positive for congestion and rhinorrhea. Negative for ear pain.   Eyes: Negative for discharge.  Respiratory: Positive for cough. Negative for shortness of breath and wheezing.   Cardiovascular: Negative for chest pain.   25 minutes was spent with the patient. Greater than half the time was spent in discussion and answering questions and counseling regarding the issues that the patient came in for today.     Objective:   Physical Exam  Constitutional: She appears well-developed.  HENT:  Head: Normocephalic.  Nose: Nose normal.  Mouth/Throat: Oropharynx is clear and moist. No oropharyngeal exudate.  Neck: Neck supple.  Cardiovascular: Normal rate and normal heart sounds.   No murmur heard. Pulmonary/Chest: Effort normal and breath sounds normal. She has no wheezes.  Lymphadenopathy:    She has no cervical adenopathy.  Skin: Skin is warm and dry.  Nursing note and vitals reviewed.   I do not find any significant evidence of swelling currently       Assessment & Plan:   reduce diuretic 2  in the morning one at noon Reduce lisinopril one half of the tablet daily -2.5 mg daily  prescription on her pain medicines were is renewed.   3 scripts given patient denies abusing it  sinus infection additional antibiotics  hypokalemia check lab work in 2-3 weeks after change   previous cardiomyopathy I believe is doing very well currently no need for repeat echo

## 2015-10-07 NOTE — Telephone Encounter (Signed)
Clindamycin 300 mg 1 3 times a day 10 days-if problems with the medication notify us.

## 2015-10-07 NOTE — Telephone Encounter (Signed)
Med sent to pharmacy. Patient was notified.  

## 2015-10-07 NOTE — Telephone Encounter (Signed)
Pt called stating that an antibiotic was suppose to be called in for her but was not.       [employer]

## 2015-12-08 ENCOUNTER — Other Ambulatory Visit: Payer: Self-pay | Admitting: Family Medicine

## 2016-01-04 ENCOUNTER — Other Ambulatory Visit: Payer: Self-pay | Admitting: Family Medicine

## 2016-01-04 ENCOUNTER — Telehealth: Payer: Self-pay | Admitting: *Deleted

## 2016-01-04 ENCOUNTER — Encounter: Payer: Self-pay | Admitting: Family Medicine

## 2016-01-04 ENCOUNTER — Ambulatory Visit (INDEPENDENT_AMBULATORY_CARE_PROVIDER_SITE_OTHER): Payer: Medicaid Other | Admitting: Family Medicine

## 2016-01-04 VITALS — BP 122/80 | Ht 66.0 in | Wt 207.1 lb

## 2016-01-04 DIAGNOSIS — E785 Hyperlipidemia, unspecified: Secondary | ICD-10-CM

## 2016-01-04 DIAGNOSIS — M549 Dorsalgia, unspecified: Secondary | ICD-10-CM

## 2016-01-04 DIAGNOSIS — M5431 Sciatica, right side: Secondary | ICD-10-CM | POA: Diagnosis not present

## 2016-01-04 DIAGNOSIS — Z79899 Other long term (current) drug therapy: Secondary | ICD-10-CM | POA: Diagnosis not present

## 2016-01-04 DIAGNOSIS — J301 Allergic rhinitis due to pollen: Secondary | ICD-10-CM | POA: Diagnosis not present

## 2016-01-04 DIAGNOSIS — G8929 Other chronic pain: Secondary | ICD-10-CM

## 2016-01-04 DIAGNOSIS — K219 Gastro-esophageal reflux disease without esophagitis: Secondary | ICD-10-CM | POA: Diagnosis not present

## 2016-01-04 MED ORDER — HYDROCODONE-ACETAMINOPHEN 5-325 MG PO TABS: ORAL_TABLET | ORAL | Status: DC

## 2016-01-04 MED ORDER — OLOPATADINE HCL 0.2 % OP SOLN: 1.0000 [drp] | Freq: Every evening | OPHTHALMIC | Status: DC | PRN

## 2016-01-04 MED ORDER — AZITHROMYCIN 250 MG PO TABS: ORAL_TABLET | ORAL | Status: DC

## 2016-01-04 MED ORDER — PREGABALIN 100 MG PO CAPS: 100.0000 mg | ORAL_CAPSULE | Freq: Two times a day (BID) | ORAL | Status: DC

## 2016-01-04 NOTE — Telephone Encounter (Signed)
lyrica is non preferred. Pt needs to try and fail two preferred drugs. List sent back to your folder. Pulled paper chart.  Pt has tried gabapentin in the past.

## 2016-01-04 NOTE — Progress Notes (Signed)
   Subjective:    Patient ID: Pamela Mendez, female    DOB: Aug 09, 1966, 50 y.o.   MRN: [id number]  HPI This patient was seen today for chronic pain  The medication list was reviewed and updated.   -Compliance with medication: yes  - Number patient states they take daily: 2-3 daily   -when was the last dose patient took: yesterday   The patient was advised the importance of maintaining medication and not using illegal substances with these.  Refills needed: yes  The patient was educated that we can provide 3 monthly scripts for their medication, it is their responsibility to follow the instructions.  Side effects or complications from medications: none  Patient is aware that pain medications are meant to minimize the severity of the pain to allow their pain levels to improve to allow for better function. They are aware of that pain medications cannot totally remove their pain.  Due for UDT ( at least once per year) :   Patient states that she has head congestion, ear pain and pressure. Onset 2-3 weeks ago.  Patient with sinus pressure pain discomfort denies high fever chills sweats does relate allergies Relates reflux under good control PPI but is concerned about side effects of PPIs. We discussed this in detail She relates severe sciatic pain and discomfort Lyrica does help she is interested in possibly increasing this as he pounds more She does take her cholesterol medicine tries to watch her diet does try to exercise she is not been successful at losing weight    Review of Systems  Constitutional: Negative for fever and activity change.  HENT: Positive for congestion and rhinorrhea. Negative for ear pain.   Eyes: Negative for discharge.  Respiratory: Positive for cough. Negative for shortness of breath and wheezing.   Cardiovascular: Negative for chest pain.       Objective:   Physical Exam  Constitutional: She appears well-developed.  HENT:  Head: Normocephalic.    Nose: Nose normal.  Mouth/Throat: Oropharynx is clear and moist. No oropharyngeal exudate.  Neck: Neck supple.  Cardiovascular: Normal rate and normal heart sounds.   No murmur heard. Pulmonary/Chest: Effort normal and breath sounds normal. She has no wheezes.  Lymphadenopathy:    She has no cervical adenopathy.  Skin: Skin is warm and dry.  Nursing note and vitals reviewed.    25 minutes was spent with the patient. Greater than half the time was spent in discussion and answering questions and counseling regarding the issues that the patient came in for today.      Assessment & Plan:  Sciatica right side this impacts her quality of life I recommend increasing Lyrica to 100 mg twice daily we can keep increasing the dose as long as she tolerates it and is helping her she will give [place] feedback  Allergic rhinitis allergy medicines OTC recommended. Continue current measures Hyperlipidemia recent lab work reviewed plus also cholesterol medicine continue this. Chronic back pain 3 prescriptions were given patient does not abuse medication. She understands to never take more than 3 no day. Typically some day she only takes 2.  Reflux-she's been under good control recently she will try backing down the PPI. If she has ongoing troubles with reflux she may have to try a different approach if she once to get away from PPI such as Zantac Mild sinusitis antibiotics prescribed warning signs discussed Patient with chronic pedal edema under good control currently

## 2016-01-06 NOTE — Telephone Encounter (Signed)
Currently her insurance does not cover Lyrica. According to insurance it is cause that she has to take gabapentin. I would recommend this start off 100 mg 3 times a day, #90, 4 refills

## 2016-01-07 MED ORDER — GABAPENTIN 100 MG PO CAPS: 100.0000 mg | ORAL_CAPSULE | Freq: Three times a day (TID) | ORAL | Status: DC

## 2016-01-07 NOTE — Telephone Encounter (Signed)
Spoke with patient and informed her per her insurance Lyrica is no longer a preferred drug. Dr.Scott Luking recommends patient try Gabapentin 100 mg 3 times a day. Patient verbalized understanding and medication was sent into pharmacy.

## 2016-01-11 ENCOUNTER — Other Ambulatory Visit: Payer: Self-pay | Admitting: *Deleted

## 2016-02-03 ENCOUNTER — Other Ambulatory Visit: Payer: Self-pay | Admitting: Family Medicine

## 2016-02-04 ENCOUNTER — Telehealth: Payer: Self-pay | Admitting: Family Medicine

## 2016-02-04 MED ORDER — METHOCARBAMOL 500 MG PO TABS: ORAL_TABLET | ORAL | Status: DC

## 2016-02-04 NOTE — Telephone Encounter (Signed)
methocarbamol (ROBAXIN) 500 MG tablet  Pt is completley out of this med, asked for the refill a few days ago  According to the patient this has not been sent in can we do it today?  [place]

## 2016-02-04 NOTE — Telephone Encounter (Signed)
Ok times one 

## 2016-02-04 NOTE — Telephone Encounter (Signed)
Notified patient refill sent to pharmacy.  

## 2016-02-07 NOTE — Telephone Encounter (Signed)
May have this +2 refills 

## 2016-03-03 ENCOUNTER — Emergency Department (HOSPITAL_COMMUNITY)
Admission: EM | Admit: 2016-03-03 | Discharge: 2016-03-03 | Disposition: A | Discharge status: Home / Self Care | Payer: Medicaid Other | Attending: Emergency Medicine | Admitting: Emergency Medicine

## 2016-03-03 ENCOUNTER — Emergency Department (HOSPITAL_COMMUNITY): Payer: Medicaid Other

## 2016-03-03 ENCOUNTER — Encounter (HOSPITAL_COMMUNITY): Payer: Self-pay

## 2016-03-03 DIAGNOSIS — Y929 Unspecified place or not applicable: Secondary | ICD-10-CM | POA: Insufficient documentation

## 2016-03-03 DIAGNOSIS — W2203XA Walked into furniture, initial encounter: Secondary | ICD-10-CM | POA: Insufficient documentation

## 2016-03-03 DIAGNOSIS — F329 Major depressive disorder, single episode, unspecified: Secondary | ICD-10-CM | POA: Diagnosis not present

## 2016-03-03 DIAGNOSIS — Y999 Unspecified external cause status: Secondary | ICD-10-CM | POA: Insufficient documentation

## 2016-03-03 DIAGNOSIS — S99922A Unspecified injury of left foot, initial encounter: Secondary | ICD-10-CM | POA: Diagnosis present

## 2016-03-03 DIAGNOSIS — Z87891 Personal history of nicotine dependence: Secondary | ICD-10-CM | POA: Diagnosis not present

## 2016-03-03 DIAGNOSIS — S92502A Displaced unspecified fracture of left lesser toe(s), initial encounter for closed fracture: Secondary | ICD-10-CM | POA: Insufficient documentation

## 2016-03-03 DIAGNOSIS — I509 Heart failure, unspecified: Secondary | ICD-10-CM | POA: Diagnosis not present

## 2016-03-03 DIAGNOSIS — I110 Hypertensive heart disease with heart failure: Secondary | ICD-10-CM | POA: Diagnosis not present

## 2016-03-03 DIAGNOSIS — Y939 Activity, unspecified: Secondary | ICD-10-CM | POA: Insufficient documentation

## 2016-03-03 DIAGNOSIS — E785 Hyperlipidemia, unspecified: Secondary | ICD-10-CM | POA: Insufficient documentation

## 2016-03-03 MED ORDER — IBUPROFEN 800 MG PO TABS: 800.0000 mg | ORAL_TABLET | Freq: Three times a day (TID) | ORAL | Status: DC

## 2016-03-03 NOTE — Discharge Instructions (Signed)
Toe Fracture °A toe fracture is a break in one of the toe bones (phalanges). °HOME CARE °If You Have a Cast: °· Do not stick anything inside the cast to scratch your skin. °· Check the skin around the cast every day. Tell your doctor about any concerns. Do not put lotion on the skin underneath the cast. You may put lotion on dry skin around the edges of the cast. °· Do not put pressure on any part of the cast until it is fully hardened. This may take many hours. °· Keep the cast clean and dry. °Bathing °· Do not take baths, swim, or use a hot tub until your doctor says that you can. Ask your doctor if you can take showers. You may only be allowed to take sponge baths for bathing. °· If your doctor says that bathing and showering are okay, cover the cast or bandage (dressing) with a watertight plastic bag to protect it from water. Do not let the cast or bandage get wet. °Managing Pain, Stiffness, and Swelling °· If you do not have a cast, put ice on the injured area if told by your doctor: °¨ Put ice in a plastic bag. °¨ Place a towel between your skin and the bag. °¨ Leave the ice on for 20 minutes, 2-3 times per day. °· Move your toes often to avoid stiffness and to lessen swelling. °· Raise (elevate) the injured area above the level of your heart while you are sitting or lying down. °Driving °· Do not drive or use heavy machinery while taking pain medicine. °· Do not drive while wearing a cast on a foot that you use for driving. °Activity °· Return to your normal activities as told by your doctor. Ask your doctor what activities are safe for you. °· Perform exercises daily as told by your doctor or therapist. °Safety °· Do not use your leg to support your body weight until your doctor says that you can. Use crutches or other tools to help you move around as told by your doctor. °General Instructions °· If your toe was taped to a toe that is next to it (buddy taping), follow your doctor's instructions for changing  the gauze and tape. Change it more often: °¨ If the gauze and tape get wet. If this happens, dry the space between the toes. °¨ If the gauze and tape are too tight and they cause your toe to become pale or to lose feeling (numb). °· Wear a protective shoe as told by your doctor. If you were not given one, wear sturdy shoes that support your foot. Your shoes should not pinch your toes. Your shoes should not fit tightly against your toes. °· Do not use any tobacco products, including cigarettes, chewing tobacco, or e-cigarettes. Tobacco can delay bone healing. If you need help quitting, ask your doctor. °· Take medicines only as told by your doctor. °· Keep all follow-up visits as told by your doctor. This is important. °GET HELP IF: °· You have a fever. °· Your pain medicine is not helping. °· Your toe feels cold. °· You lose feeling (have numbness) in your toe. °· You still have pain after one week of rest and treatment. °· You still have pain after your doctor has said that you can start walking again. °· You have pain or tingling in your foot, and it is not going away. °· You have loss of feeling in your foot, and it is not going away. °GET   HELP RIGHT AWAY IF: °· You have severe pain. °· You have redness or swelling (inflammation) in your toe, and it is getting worse. °· You have pain or loss of feeling in your toe, and it is getting worse. °· Your toe is blue. °  °This information is not intended to replace advice given to you by your health care provider. Make sure you discuss any questions you have with your health care provider. °  °Document Released: 02/07/2008 Document Revised: 01/05/2015 Document Reviewed: 06/17/2014 °Elsevier Interactive Patient Education ©2016 [employer] ° °

## 2016-03-03 NOTE — ED Notes (Signed)
Left 4th toe injury with pain. Patient states she hit her toe on a foot stool.

## 2016-03-03 NOTE — ED Provider Notes (Signed)
CSN: [id number]     Arrival date & time 03/03/16  2015 History   First MD Initiated Contact with Patient 03/03/16 2030     Chief Complaint  Patient presents with  . Toe Pain     (Consider location/radiation/quality/duration/timing/severity/associated sxs/prior Treatment) HPI   Pamela Mendez is a 50 y.o. female who presents to the Emergency Department complaining of pain and swelling to the left fourth toe that began last evening. She states that she accidentally struck her toe on a foot stool. Pain became worse today with walking. She has not tried any therapies for symptom relief.  She denies open wound, pain to the nail, or numbness   Past Medical History  Diagnosis Date  . Hypertension   . Anxiety and depression   . Cardiomyopathy 2004    mild, post-partum  . Abdominal pain     Unspecified site  . Hyperlipidemia   . Anxiety   . Depression   . GERD (gastroesophageal reflux disease)   . Chronic back pain   . Migraine headache without aura   . Gastritis   . H/O echocardiogram 2012    EF 55-60%  . CHF (congestive heart failure) (HCC) 2004    post-partum  . Chronic left hip pain    Past Surgical History  Procedure Laterality Date  . Lumbar disc surgery  2010    [place], Dr Marikay Alar  . Childbirth      time 3  . Cholecystectomy  2005    [facility] , Dr Katrinka Blazing  . Lumbar disc surgery  2005    [place], Dr Marikay Alar   Family History  Problem Relation Age of Onset  . Heart attack Father   . Anesthesia problems Neg Hx   . Hypotension Neg Hx   . Malignant hyperthermia Neg Hx   . Pseudochol deficiency Neg Hx    Social History  Substance Use Topics  . Smoking status: Former Smoker -- 0.25 packs/day for 15 years    Types: Cigarettes    Quit date: 08/23/2013  . Smokeless tobacco: Never Used  . Alcohol Use: No   OB History    Gravida Para Term Preterm AB TAB SAB Ectopic Multiple Living   3 3 3       3      Review of Systems  Constitutional: Negative for  fever and chills.  Musculoskeletal: Positive for joint swelling and arthralgias (Pain and swelling of the left fourth toe).  Skin: Negative for color change and wound.  Neurological: Negative for weakness and numbness.  All other systems reviewed and are negative.     Allergies  Cefzil; Sulfa antibiotics; Augmentin; and Avelox  Home Medications   Prior to Admission medications   Medication Sig Start Date End Date Taking? Authorizing Provider  azithromycin (ZITHROMAX Z-PAK) 250 MG tablet Take 2 tablets (500 mg) on  Day 1,  followed by 1 tablet (250 mg) once daily on Days 2 through 5. 01/04/16   Babs Sciara, MD  cetirizine (ZYRTEC) 10 MG tablet TAKE ONE TABLET BY MOUTH AT BEDTIME AS NEEDED FOR ALLERGY 10/07/15   Babs Sciara, MD  clindamycin (CLEOCIN) 300 MG capsule Take 1 capsule (300 mg total) by mouth 3 (three) times daily. For 10 days Patient not taking: Reported on 01/04/2016 10/07/15   Babs Sciara, MD  fluticasone (FLONASE) 50 MCG/ACT nasal spray Place 2 sprays into both nostrils daily. 10/07/15   Babs Sciara, MD  gabapentin (NEURONTIN) 100 MG  capsule Take 1 capsule (100 mg total) by mouth 3 (three) times daily. 01/07/16   Babs Sciara, MD  HYDROcodone-acetaminophen (NORCO/VICODIN) 5-325 MG tablet One tid prn prn pain 01/04/16   Babs Sciara, MD  lisinopril (PRINIVIL,ZESTRIL) 5 MG tablet TAKE ONE TABLET BY MOUTH ONCE DAILY 10/07/15   Babs Sciara, MD  Menthol, Topical Analgesic, (ICY HOT) 7.5 % (Roll) MISC Apply 1 each topically as needed (Pain).    Historical Provider, MD  methocarbamol (ROBAXIN) 500 MG tablet TAKE 1 TABLET BY MOUTH EVERY 6 HOURS AS NEEDED FOR PAIN/MUSCLE SPASMS. 02/07/16   Babs Sciara, MD  methocarbamol (ROBAXIN) 500 MG tablet TAKE 1 TABLET BY MOUTH EVERY 6 HOURS AS NEEDED FOR PAIN/MUSCLE SPASMS. 02/04/16   Merlyn Albert, MD  Olopatadine HCl 0.2 % SOLN Apply 1 drop to eye at bedtime as needed. 01/04/16   Babs Sciara, MD  pantoprazole (PROTONIX) 40 MG tablet TAKE  ONE TABLET BY MOUTH ONCE DAILY 10/07/15   Babs Sciara, MD  potassium chloride SA (KLOR-CON M20) 20 MEQ tablet Take 2 tablets qam 2 tablets at noon and 2 in the evening. 10/07/15   Babs Sciara, MD  simvastatin (ZOCOR) 20 MG tablet TAKE ONE TABLET BY MOUTH ONCE DAILY 10/07/15   Babs Sciara, MD  torsemide (DEMADEX) 20 MG tablet TAKE THREE TABLETS BY MOUTH IN THE MORNING AND TWO AT NOON 10/07/15   Babs Sciara, MD   BP 97/50 mmHg  Pulse 76  Temp(Src) 98 F (36.7 C) (Oral)  Resp 18  Ht  (1.676 m)  Wt 91.173 kg  BMI 32.46 kg/m2  SpO2 96% Physical Exam  Constitutional: She is oriented to person, place, and time. She appears well-developed and well-nourished. No distress.  HENT:  Head: Normocephalic and atraumatic.  Cardiovascular: Normal rate, regular rhythm and intact distal pulses.   Pulmonary/Chest: Effort normal and breath sounds normal.  Musculoskeletal: She exhibits edema and tenderness.  Tenderness to palpation and mild edema of the proximal left fourth toe. No ecchymosis, no distal tenderness, sensation intact  Neurological: She is alert and oriented to person, place, and time. She exhibits normal muscle tone. Coordination normal.  Skin: Skin is warm and dry.  Nursing note and vitals reviewed.   ED Course  Procedures (including critical care time) Labs Review Labs Reviewed - No data to display  Imaging Review Dg Toe 4th Left  03/03/2016  CLINICAL DATA:  Injury to left fourth toe. EXAM: LEFT FOURTH TOE COMPARISON:  None. FINDINGS: Possible nondisplaced oblique fracture of the mid to distal aspect of the fourth proximal phalanx seen only on one view. Remainder of the exam is within normal. IMPRESSION: Possible nondisplaced oblique fracture of the mid to distal fourth proximal phalanx. Consider follow-up radiographs 10-14 days. Electronically Signed   By: Elberta Fortis M.D.   On: 03/03/2016 21:42   I have personally reviewed and evaluated these images and lab results as part  of my medical decision-making.   EKG Interpretation None      MDM   Final diagnoses:  Fracture of fourth toe, left, closed, initial encounter    Left fourth toe was buddy taped and postop shoe applied. Patient agrees to elevate, ice, and orthopedic follow-up in one week if not improving.    Pauline Aus, PA-C 03/03/16 2210  Vanetta Mulders, MD 03/06/16 2217

## 2016-03-20 ENCOUNTER — Other Ambulatory Visit: Payer: Self-pay | Admitting: Family Medicine

## 2016-04-04 ENCOUNTER — Other Ambulatory Visit: Payer: Self-pay | Admitting: Nurse Practitioner

## 2016-04-04 ENCOUNTER — Telehealth: Payer: Self-pay | Admitting: Family Medicine

## 2016-04-04 MED ORDER — HYDROCODONE-ACETAMINOPHEN 5-325 MG PO TABS: ORAL_TABLET | ORAL | 0 refills | Status: DC

## 2016-04-04 NOTE — Telephone Encounter (Signed)
Wrote a 10 day script which will get her through until appointment. Please leave up front for her to pick up.

## 2016-04-04 NOTE — Telephone Encounter (Signed)
Pt is calling to see if she can get a few weeks supply on her hydrocodone. She was unable to reschedule her pn mgmt appt in time before she runs out tomorrow. She made an appt for 8/11 with Eber Jones   Please advise

## 2016-04-04 NOTE — Telephone Encounter (Signed)
Tried to call no answer. rx up front for pickup for a 10 day supply

## 2016-04-04 NOTE — Telephone Encounter (Signed)
Pt.notified

## 2016-04-05 ENCOUNTER — Ambulatory Visit: Payer: Medicaid Other | Admitting: Family Medicine

## 2016-04-05 ENCOUNTER — Other Ambulatory Visit: Payer: Self-pay | Admitting: Family Medicine

## 2016-04-14 ENCOUNTER — Encounter: Payer: Self-pay | Admitting: Nurse Practitioner

## 2016-04-14 ENCOUNTER — Ambulatory Visit (INDEPENDENT_AMBULATORY_CARE_PROVIDER_SITE_OTHER): Payer: Medicaid Other | Admitting: Nurse Practitioner

## 2016-04-14 VITALS — BP 120/82 | Ht 66.0 in | Wt 204.1 lb

## 2016-04-14 DIAGNOSIS — Z79891 Long term (current) use of opiate analgesic: Secondary | ICD-10-CM | POA: Diagnosis not present

## 2016-04-14 DIAGNOSIS — J0100 Acute maxillary sinusitis, unspecified: Secondary | ICD-10-CM | POA: Diagnosis not present

## 2016-04-14 DIAGNOSIS — M549 Dorsalgia, unspecified: Secondary | ICD-10-CM | POA: Diagnosis not present

## 2016-04-14 DIAGNOSIS — G8929 Other chronic pain: Secondary | ICD-10-CM

## 2016-04-14 MED ORDER — HYDROCODONE-ACETAMINOPHEN 5-325 MG PO TABS: ORAL_TABLET | ORAL | 0 refills | Status: DC

## 2016-04-14 MED ORDER — AZITHROMYCIN 250 MG PO TABS: ORAL_TABLET | ORAL | 0 refills | Status: DC

## 2016-04-14 MED ORDER — GABAPENTIN 100 MG PO CAPS: ORAL_CAPSULE | ORAL | 2 refills | Status: DC

## 2016-04-14 NOTE — Progress Notes (Signed)
Subjective: This patient was seen today for chronic pain  The medication list was reviewed and updated.   -Compliance with medication: yes  - Number patient states they take daily: max 3 per day  -when was the last dose patient took? Last night  The patient was advised the importance of maintaining medication and not using illegal substances with these.  Refills needed: yes  The patient was educated that we can provide 3 monthly scripts for their medication, it is their responsibility to follow the instructions.  Side effects or complications from medications: none  Patient is aware that pain medications are meant to minimize the severity of the pain to allow their pain levels to improve to allow for better function. They are aware of that pain medications cannot totally remove their pain. Pain 7-8/10 in am; 1/2 am and at lunch; no more than 3 per day max; did well on Lyrica but had to stop due to insurance. Current dose of Gabapentin not working as well. Takes Robaxin mainly in the evenings due to pain and stiffness.   Due for UDT ( at least once per year) : today  [place] CSRS reviewed  Also, c/o sinus congestion x 2 weeks. No fever. Maxillary area sinus pressure. Off/on cough. Quit smoking November 2016. Brown yellow mucus at times. Ear pain, worse on left. No sore throat.   Objective: NAD. Alert, oriented. TMs clear effusion, no erythema. Pharynx mildly injected with PND noted. Neck supple with mild soft tender anterior adenopathy. Lungs clear. Heart RRR.   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 azithromycin (ZITHROMAX Z-PAK) 250 MG tablet    Sig: Take 2 tablets (500 mg) on  Day 1,  followed by 1 tablet (250 mg) once daily on Days 2 through 5.    Dispense:  6 each    Refill:  0    Order Specific Question:   Supervising Provider    Answer:   Merlyn Albert [[id number]]  . DISCONTD: HYDROcodone-acetaminophen (NORCO/VICODIN) 5-325 MG tablet    Sig: One tid prn prn pain    Dispense:  90 tablet    Refill:  0    Order Specific Question:   Supervising Provider    Answer:   Merlyn Albert [[id number]]  . DISCONTD: HYDROcodone-acetaminophen (NORCO/VICODIN) 5-325 MG tablet    Sig: One tid prn prn pain    Dispense:  90 tablet    Refill:  0    May fill 30 days from 04/14/16    Order Specific Question:   Supervising Provider    Answer:   Merlyn Albert [[id number]]  . HYDROcodone-acetaminophen (NORCO/VICODIN) 5-325 MG tablet    Sig: One tid prn prn pain    Dispense:  90 tablet    Refill:  0    May fill 60 days from 04/14/16    Order Specific Question:   Supervising Provider    Answer:   Merlyn Albert [[id number]]  .  gabapentin (NEURONTIN) 100 MG capsule    Sig: 2 po TID for nerve pain    Dispense:  180 capsule    Refill:  2    Order Specific Question:   Supervising Provider    Answer:   Merlyn Albert [[id number]]   Restart Flonase and antihistamine. Call back if worsens or persists. Increase gabapentin to 200 mg TID. May need to increase dose again in a few weeks depending on response.  Return in about 3 months (around 07/15/2016) for pain management. Reminded about preventive health physical.

## 2016-04-21 LAB — TOXASSURE SELECT 13 (MW), URINE: PDF: 0

## 2016-04-21 LAB — PLEASE NOTE

## 2016-05-26 ENCOUNTER — Telehealth: Payer: Self-pay | Admitting: *Deleted

## 2016-05-26 NOTE — Telephone Encounter (Signed)
Prior authorization request for HYDROCODONE/APAP 5/325mg  was APPROVED. Approval # [id number].  Approval good 05/20/16-11/16/16. Pharmacy notified.

## 2016-06-05 ENCOUNTER — Other Ambulatory Visit: Payer: Self-pay | Admitting: Family Medicine

## 2016-06-06 ENCOUNTER — Telehealth: Payer: Self-pay | Admitting: Family Medicine

## 2016-06-06 MED ORDER — METHOCARBAMOL 500 MG PO TABS: ORAL_TABLET | ORAL | 5 refills | Status: DC

## 2016-06-06 MED ORDER — LISINOPRIL 5 MG PO TABS: 5.0000 mg | ORAL_TABLET | Freq: Every day | ORAL | 0 refills | Status: DC

## 2016-06-06 NOTE — Telephone Encounter (Signed)
Spoke with patient's mother and informed her per Dr.Scott Luking- We sent in refills on Robaxin and Lisinopril. Patient verbalized understanding.

## 2016-06-06 NOTE — Telephone Encounter (Signed)
Pt is needing refills on her methocarbamol (ROBAXIN) 500 MG tablet lisinopril (PRINIVIL,ZESTRIL) 5 MG tablet   [facility]

## 2016-06-06 NOTE — Telephone Encounter (Signed)
Refill 6 each 

## 2016-06-22 ENCOUNTER — Encounter: Payer: Self-pay | Admitting: Family Medicine

## 2016-06-22 ENCOUNTER — Ambulatory Visit (INDEPENDENT_AMBULATORY_CARE_PROVIDER_SITE_OTHER): Payer: Medicaid Other | Admitting: Family Medicine

## 2016-06-22 VITALS — BP 120/88 | Temp 98.1°F | Ht 66.0 in | Wt 207.2 lb

## 2016-06-22 DIAGNOSIS — J019 Acute sinusitis, unspecified: Secondary | ICD-10-CM

## 2016-06-22 DIAGNOSIS — B9689 Other specified bacterial agents as the cause of diseases classified elsewhere: Secondary | ICD-10-CM

## 2016-06-22 MED ORDER — HYDROCODONE-ACETAMINOPHEN 5-325 MG PO TABS: ORAL_TABLET | ORAL | 0 refills | Status: DC

## 2016-06-22 MED ORDER — DOXYCYCLINE HYCLATE 100 MG PO TABS
100.0000 mg | ORAL_TABLET | Freq: Two times a day (BID) | ORAL | 0 refills | Status: DC
Start: 2016-06-22 — End: 2016-08-17

## 2016-06-22 NOTE — Progress Notes (Signed)
   Subjective:    Patient ID: Pamela Mendez, female    DOB: 11-30-1965, 50 y.o.   MRN: [id number]  Sinusitis  This is a new problem. The current episode started in the past 7 days. The problem is unchanged. There has been no fever. The pain is moderate. Associated symptoms include congestion, coughing, ear pain and sinus pressure. Pertinent negatives include no shortness of breath. Past treatments include oral decongestants. The treatment provided no relief.   Patient has no other concerns at this time.    Review of Systems  Constitutional: Negative for activity change and fever.  HENT: Positive for congestion, ear pain, rhinorrhea and sinus pressure.   Eyes: Negative for discharge.  Respiratory: Positive for cough. Negative for shortness of breath and wheezing.   Cardiovascular: Negative for chest pain.       Objective:   Physical Exam  Constitutional: She appears well-developed.  HENT:  Head: Normocephalic.  Nose: Nose normal.  Mouth/Throat: Oropharynx is clear and moist. No oropharyngeal exudate.  Neck: Neck supple.  Cardiovascular: Normal rate and normal heart sounds.   No murmur heard. Pulmonary/Chest: Effort normal and breath sounds normal. She has no wheezes.  Lymphadenopathy:    She has no cervical adenopathy.  Skin: Skin is warm and dry.  Nursing note and vitals reviewed.         Assessment & Plan:  Viral URI Secondary rhinosinusitis Antibiotics prescribed warning signs discussed follow-up

## 2016-06-26 ENCOUNTER — Encounter (HOSPITAL_COMMUNITY): Payer: Self-pay | Admitting: Emergency Medicine

## 2016-06-26 ENCOUNTER — Observation Stay (HOSPITAL_COMMUNITY)
Admission: EM | Admit: 2016-06-26 | Discharge: 2016-06-27 | Disposition: A | Discharge status: Home / Self Care | Payer: Medicaid Other | Attending: Internal Medicine | Admitting: Internal Medicine

## 2016-06-26 ENCOUNTER — Telehealth: Payer: Self-pay | Admitting: Family Medicine

## 2016-06-26 ENCOUNTER — Emergency Department (HOSPITAL_COMMUNITY): Payer: Medicaid Other

## 2016-06-26 DIAGNOSIS — Z79899 Other long term (current) drug therapy: Secondary | ICD-10-CM | POA: Diagnosis not present

## 2016-06-26 DIAGNOSIS — J329 Chronic sinusitis, unspecified: Secondary | ICD-10-CM | POA: Diagnosis not present

## 2016-06-26 DIAGNOSIS — T7840XA Allergy, unspecified, initial encounter: Secondary | ICD-10-CM

## 2016-06-26 DIAGNOSIS — Z87891 Personal history of nicotine dependence: Secondary | ICD-10-CM | POA: Diagnosis not present

## 2016-06-26 DIAGNOSIS — K219 Gastro-esophageal reflux disease without esophagitis: Secondary | ICD-10-CM | POA: Diagnosis not present

## 2016-06-26 DIAGNOSIS — E876 Hypokalemia: Secondary | ICD-10-CM | POA: Insufficient documentation

## 2016-06-26 DIAGNOSIS — I10 Essential (primary) hypertension: Secondary | ICD-10-CM | POA: Diagnosis not present

## 2016-06-26 DIAGNOSIS — R1310 Dysphagia, unspecified: Secondary | ICD-10-CM | POA: Diagnosis not present

## 2016-06-26 DIAGNOSIS — E785 Hyperlipidemia, unspecified: Secondary | ICD-10-CM | POA: Insufficient documentation

## 2016-06-26 LAB — BASIC METABOLIC PANEL
ANION GAP: 10 (ref 5–15)
BUN: 14 mg/dL (ref 6–20)
CALCIUM: 8.8 mg/dL — AB (ref 8.9–10.3)
CHLORIDE: 100 mmol/L — AB (ref 101–111)
CO2: 28 mmol/L (ref 22–32)
Creatinine, Ser: 1.17 mg/dL — ABNORMAL HIGH (ref 0.44–1.00)
GFR calc non Af Amer: 54 mL/min — ABNORMAL LOW (ref >60)
GLUCOSE: 167 mg/dL — AB (ref 65–99)
POTASSIUM: 2.7 mmol/L — AB (ref 3.5–5.1)
Sodium: 138 mmol/L (ref 135–145)

## 2016-06-26 LAB — CBC WITH DIFFERENTIAL/PLATELET
BASOS ABS: 0.1 10*3/uL (ref 0.0–0.1)
BASOS PCT: 1 %
Eosinophils Absolute: 0.4 10*3/uL (ref 0.0–0.7)
Eosinophils Relative: 4 %
HEMATOCRIT: 41.8 % (ref 36.0–46.0)
HEMOGLOBIN: 14.1 g/dL (ref 12.0–15.0)
LYMPHS PCT: 41 %
Lymphs Abs: 4.5 10*3/uL — ABNORMAL HIGH (ref 0.7–4.0)
MCH: 29.9 pg (ref 26.0–34.0)
MCHC: 33.7 g/dL (ref 30.0–36.0)
MCV: 88.7 fL (ref 78.0–100.0)
MONO ABS: 0.9 10*3/uL (ref 0.1–1.0)
Monocytes Relative: 8 %
NEUTROS ABS: 5.1 10*3/uL (ref 1.7–7.7)
NEUTROS PCT: 46 %
Platelets: 273 10*3/uL (ref 150–400)
RBC: 4.71 MIL/uL (ref 3.87–5.11)
RDW: 12.7 % (ref 11.5–15.5)
WBC: 10.9 10*3/uL — AB (ref 4.0–10.5)

## 2016-06-26 LAB — I-STAT CG4 LACTIC ACID, ED: Lactic Acid, Venous: 2.86 mmol/L (ref 0.5–1.9)

## 2016-06-26 MED ORDER — POTASSIUM CHLORIDE CRYS ER 20 MEQ PO TBCR
20.0000 meq | EXTENDED_RELEASE_TABLET | Freq: Once | ORAL | Status: AC
  Administered 2016-06-26: 20 meq via ORAL
  Filled 2016-06-26: qty 1

## 2016-06-26 MED ORDER — METHYLPREDNISOLONE SODIUM SUCC 125 MG IJ SOLR
125.0000 mg | Freq: Once | INTRAMUSCULAR | Status: AC
  Administered 2016-06-26: 125 mg via INTRAVENOUS
  Filled 2016-06-26: qty 2

## 2016-06-26 MED ORDER — POTASSIUM CHLORIDE 10 MEQ/100ML IV SOLN
10.0000 meq | INTRAVENOUS | Status: AC
  Administered 2016-06-26 – 2016-06-27 (×2): 10 meq via INTRAVENOUS
  Filled 2016-06-26 (×2): qty 100

## 2016-06-26 MED ORDER — DIPHENHYDRAMINE HCL 50 MG/ML IJ SOLN
50.0000 mg | Freq: Once | INTRAMUSCULAR | Status: AC
  Administered 2016-06-26: 50 mg via INTRAVENOUS
  Filled 2016-06-26: qty 1

## 2016-06-26 MED ORDER — SODIUM CHLORIDE 0.9 % IV SOLN
INTRAVENOUS | Status: DC
  Administered 2016-06-26: 23:00:00 via INTRAVENOUS

## 2016-06-26 MED ORDER — FAMOTIDINE IN NACL 20-0.9 MG/50ML-% IV SOLN
20.0000 mg | Freq: Once | INTRAVENOUS | Status: AC
  Administered 2016-06-26: 20 mg via INTRAVENOUS
  Filled 2016-06-26: qty 50

## 2016-06-26 MED ORDER — CLINDAMYCIN HCL 300 MG PO CAPS: 300.0000 mg | ORAL_CAPSULE | Freq: Three times a day (TID) | ORAL | 0 refills | Status: DC

## 2016-06-26 MED ORDER — SODIUM CHLORIDE 0.9 % IV BOLUS (SEPSIS)
1000.0000 mL | Freq: Once | INTRAVENOUS | Status: AC
  Administered 2016-06-26: 1000 mL via INTRAVENOUS

## 2016-06-26 NOTE — Telephone Encounter (Signed)
Patient states that he has head congestion, sinus pressure and a low grade fever. No other symptoms noted.

## 2016-06-26 NOTE — Telephone Encounter (Signed)
Patient seen on 06/22/2016 for bacterial rhinosinusitis.  She called back and said that she is really stopped up and sinus pressure.  She said she feels worse and wants to know if we can send in Rx for a different antibiotic?  [facility]

## 2016-06-26 NOTE — ED Triage Notes (Signed)
Pt C/O throat swelling, difficulty swallowing, and redness around the neck starting today. Pts sounds as if golf ball is in throat.

## 2016-06-26 NOTE — Telephone Encounter (Signed)
Notified patient try clindamycin 300 mg 1 3 times a day for 7 days if ongoing troubles office visit. Med sent to pharmacy.

## 2016-06-26 NOTE — Telephone Encounter (Signed)
Given limited medications that she can get along with it limits our choices try clindamycin 300 mg 1 3 times a day for 7 days if ongoing troubles office visit

## 2016-06-26 NOTE — ED Notes (Addendum)
CRITICAL VALUE ALERT  Critical value received:  Potassium- 2.7 Date of notification:  06/26/16  Time of notification:  2230  Critical value read back:Yes.    Nurse who received alert:  Meridee Score, RN  MD notified (1st page):  2231  Time of first page:  2231  MD notified (2nd page):  Time of second page:  Responding MD:  Dr Rush Landmark  Time MD responded:  2232

## 2016-06-26 NOTE — ED Provider Notes (Signed)
[facility]-EMER[facility]Y DEPT Provider Note   CSN: [id number] Arrival date & time: 06/26/16  2113  By signing my name below, I, Vista Mink, attest that this documentation has been prepared under the direction and in the presence of Heide Scales, MD. Electronically signed, Vista Mink, ED Scribe. 06/26/16. 9:57 PM.   History   Chief Complaint Chief Complaint  Patient presents with  . Allergic Reaction    HPI HPI Comments: FIFI SCHINDLER is a 50 y.o. female with a Hx of CHF, who presents to the Emergency Department complaining of gradually worsening throat swelling and and difficulty swallowing that started today around noon today. Pt states she ate a piece of chocolate cake at lunch today and started to have trouble swallowing and noticed that her throat felt like it was swelling. Pt has had unrelieved symptoms since this started today. She is currently being treated for Sinusitis and was started on Doxycycline with no improvement in her sinus symptoms. Her PCP changed her to Clindamycin and took her first dose at 1900 this evening. Pt states that her voice does sound different currently. She reports some difficulty breathing when attempting to swallow. Pt had a similar reaction approximately 20 years ago, when she determined that she was allergic to sulfa drugs. No Hx of thyroid problems. No chest pain, shortness of breath, cough. No new rash noted. Denies any recent injury to her neck.   The history is provided by the patient and a significant other. No language interpreter was used.    Past Medical History:  Diagnosis Date  . Abdominal pain    Unspecified site  . Anxiety   . Anxiety and depression   . 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 CHF (congestive heart failure) (HCC) 2004   post-partum  . Chronic back pain   . Chronic left hip pain   . Depression   . Gastritis   . GERD (gastroesophageal reflux disease)   . H/O echocardiogram 2012   EF 55-60%  . Hyperlipidemia     . Hypertension   . Migraine headache without aura     Patient Active Problem List   Diagnosis Date Noted  . Sciatica of right side 01/04/2016  . Hypokalemia 10/07/2015  . Migraine headache without aura 03/31/2015  . GERD (gastroesophageal reflux disease) 02/04/2015  . Chronic back pain 05/13/2013  . Allergic rhinitis 12/31/2012  . EDEMA 11/09/2010  . Hyperlipidemia 01/17/2010  . DEPRESSION/ANXIETY 01/17/2010  . CARDIOMYOPATHY, MILD 01/17/2010  . ABDOMINAL PAIN, UNSPECIFIED SITE 01/17/2010    Past Surgical History:  Procedure Laterality Date  . Childbirth     time 3  . CHOLECYSTECTOMY  [address] , Dr Katrinka Blazing  . LUMBAR DISC SURGERY  2010   [facility], Dr Marikay Alar  . LUMBAR DISC SURGERY  2005   [place], Dr Marikay Alar    OB History    [facility]   3 3 3     3    SAB TAB Ectopic Multiple Live Births                   Home Medications    Prior to Admission medications   Medication Sig Start Date End Date Taking? Authorizing Provider  cetirizine (ZYRTEC) 10 MG tablet TAKE ONE TABLET BY MOUTH AT BEDTIME AS NEEDED FOR ALLERGIES. 03/20/16   Babs Sciara, MD  clindamycin (CLEOCIN) 300 MG capsule Take 1 capsule (300 mg total) by mouth 3 (  three) times daily. X 7 days 06/26/16   Babs Sciara, MD  doxycycline (VIBRA-TABS) 100 MG tablet Take 1 tablet (100 mg total) by mouth 2 (two) times daily. 06/22/16   Babs Sciara, MD  fluticasone (FLONASE) 50 MCG/ACT nasal spray Place 2 sprays into both nostrils daily. 10/07/15   Babs Sciara, MD  gabapentin (NEURONTIN) 100 MG capsule 2 po TID for nerve pain 04/14/16   Campbell Riches, NP  HYDROcodone-acetaminophen (NORCO/VICODIN) 5-325 MG tablet One tid prn prn pain 06/22/16   Babs Sciara, MD  lisinopril (PRINIVIL,ZESTRIL) 5 MG tablet Take 1 tablet (5 mg total) by mouth daily. 06/06/16   Babs Sciara, MD  Menthol, Topical Analgesic, (ICY HOT) 7.5 % (Roll) MISC Apply 1 each topically as needed  (Pain).    Historical Provider, MD  methocarbamol (ROBAXIN) 500 MG tablet TAKE 1 TABLET BY MOUTH EVERY 6 HOURS AS NEEDED FOR PAIN/MUSCLE SPASMS. 06/06/16   Babs Sciara, MD  Olopatadine HCl 0.2 % SOLN Apply 1 drop to eye at bedtime as needed. 01/04/16   Babs Sciara, MD  pantoprazole (PROTONIX) 40 MG tablet TAKE ONE TABLET BY MOUTH DAILY. 04/05/16   Babs Sciara, MD  potassium chloride SA (KLOR-CON M20) 20 MEQ tablet Take 2 tablets qam 2 tablets at noon and 2 in the evening. 10/07/15   Babs Sciara, MD  simvastatin (ZOCOR) 20 MG tablet TAKE ONE TABLET BY MOUTH DAILY. 04/05/16   Babs Sciara, MD  torsemide (DEMADEX) 20 MG tablet TAKE THREE TABLETS BY MOUTH IN THE MORNING AND TWO AT NOON 10/07/15   Babs Sciara, MD    Family History Family History  Problem Relation Age of Onset  . Heart attack Father   . Anesthesia problems Neg Hx   . Hypotension Neg Hx   . Malignant hyperthermia Neg Hx   . Pseudochol deficiency Neg Hx     Social History Social History  Substance Use Topics  . Smoking status: Former Smoker    Packs/day: 0.25    Years: 15.00    Types: Cigarettes    Quit date: 08/23/2013  . Smokeless tobacco: Never Used  . Alcohol use No     Allergies   Cefzil [cefprozil]; Sulfa antibiotics; Augmentin [amoxicillin-pot clavulanate]; and Avelox [moxifloxacin hcl in nacl]   Review of Systems Review of Systems  Constitutional: Negative for activity change, chills, diaphoresis, fatigue and fever.  HENT: Positive for trouble swallowing and voice change. Negative for congestion and rhinorrhea.   Eyes: Negative for visual disturbance.  Respiratory: Negative for cough, chest tightness, shortness of breath and stridor.   Cardiovascular: Negative for chest pain, palpitations and leg swelling.  Gastrointestinal: Negative for abdominal distention, abdominal pain, constipation, diarrhea, nausea and vomiting.  Genitourinary: Negative for difficulty urinating, dysuria, flank pain, frequency,  hematuria, menstrual problem, pelvic pain, vaginal bleeding and vaginal discharge.  Musculoskeletal: Negative for back pain and neck pain.  Skin: Negative for rash and wound.  Neurological: Negative for dizziness, weakness, light-headedness, numbness and headaches.  Psychiatric/Behavioral: Negative for agitation and confusion.  All other systems reviewed and are negative.    Physical Exam Updated Vital Signs BP 102/68 (BP Location: Left Arm)   Pulse 79   Temp 97.5 F (36.4 C) (Oral)   Resp 20   Ht 5\' 6"  (1.676 m)   Wt 204 lb (92.5 kg)   SpO2 98%   BMI 32.93 kg/m   Physical Exam  Constitutional: She is oriented to person, place,  and time. She appears well-developed and well-nourished. No distress.  HENT:  Head: Atraumatic.  Right Ear: External ear normal.  Left Ear: External ear normal.  Nose: Nose normal.  Mouth/Throat: Oropharynx is clear and moist. No oropharyngeal exudate.  Eyes: Conjunctivae and EOM are normal. Pupils are equal, round, and reactive to light.  Neck: Normal range of motion. Neck supple.    Tenderness in the anterior neck and Sublingual area. Normal neck range of motion. No evidence of Peritonsillar abscess. Swelling seen under jaw. Erythema and redness all over neck.  Cardiovascular: Normal rate, regular rhythm and intact distal pulses.   No murmur heard. Pulmonary/Chest: No stridor. No respiratory distress.  Abdominal: She exhibits no distension. There is no tenderness. There is no rebound.  Neurological: She is alert and oriented to person, place, and time. She has normal reflexes. She exhibits normal muscle tone. Coordination normal.  Skin: Skin is warm. Rash noted. She is not diaphoretic. No erythema.     Mild area of redness on the neck.  Nursing note and vitals reviewed.    ED Treatments / Results  DIAGNOSTIC STUDIES: Oxygen Saturation is 98% on RA, normal by my interpretation.  COORDINATION OF CARE: 9:54 PM-Will order imaging of the neck.  Discussed treatment plan with pt at bedside and pt agreed to plan.   Labs (all labs ordered are listed, but only abnormal results are displayed) Labs Reviewed  CBC WITH DIFFERENTIAL/PLATELET - Abnormal; Notable for the following:       Result Value   WBC 10.9 (*)    Lymphs Abs 4.5 (*)    All other components within normal limits  BASIC METABOLIC PANEL - Abnormal; Notable for the following:    Potassium 2.7 (*)    Chloride 100 (*)    Glucose, Bld 167 (*)    Creatinine, Ser 1.17 (*)    Calcium 8.8 (*)    GFR calc non Af Amer 54 (*)    All other components within normal limits  COMPREHENSIVE METABOLIC PANEL - Abnormal; Notable for the following:    Glucose, Bld 220 (*)    Creatinine, Ser 1.08 (*)    Calcium 8.1 (*)    Total Protein 6.4 (*)    Albumin 3.2 (*)    GFR calc non Af Amer 59 (*)    All other components within normal limits  I-STAT CG4 LACTIC ACID, ED - Abnormal; Notable for the following:    Lactic Acid, Venous 2.86 (*)    All other components within normal limits  I-STAT CG4 LACTIC ACID, ED - Abnormal; Notable for the following:    Lactic Acid, Venous 1.98 (*)    All other components within normal limits  CBC  POC URINE PREG, ED    EKG  EKG Interpretation None       Radiology Ct Soft Tissue Neck W Contrast  Result Date: 06/27/2016 CLINICAL DATA:  Dysphagia, allergic reaction to antibiotics yesterday. History of CHF, hypertension. EXAM: CT NECK WITH CONTRAST TECHNIQUE: Multidetector CT imaging of the neck was performed using the standard protocol following the bolus administration of intravenous contrast. CONTRAST:  75mL ISOVUE-300 IOPAMIDOL (ISOVUE-300) INJECTION 61% COMPARISON:  MRI cervical spine December 16, 2004 FINDINGS: Pharynx and larynx: : Fullness of the nasopharyngeal soft tissues. Mildly prominent palatine tonsils without inflammation. RIGHT spot solid. Prominent RIGHT lingual tonsil partially effacing the RIGHT vallecula. Normal larynx, apposition the  true vocal cords most compatible with inflammation. Salivary glands: Normal. Thyroid: Normal. Lymph nodes: 10 mm short access  RIGHT level IIa and LEFT level IIa lymph nodes, additional smaller scattered lymph nodes. Vascular: Normal. Limited intracranial: Normal. Visualized orbits: Normal. Mastoids and visualized paranasal sinuses: Minimal LEFT mastoid effusion. Lobulated soft tissue LEFT middle ear and additus ad antrum. Paranasal sinuses are well-aerated. Skeleton: Broad reversed cervical lordosis. No destructive bony lesions. Multiple molar dental caries. Upper chest: Lung apices are clear. Sub cm anterior mediastinal lymph nodes, likely reactive. Other: None. IMPRESSION: Mild lymphoid hypertrophy can be seen with recent viral illness, or immunocompromised states. Patent airway. Small LEFT mastoid effusion and lobulated soft tissue in middle ear, cholesteatoma could have this appearance. CT temporal bones on a nonemergent basis is recommended. Electronically Signed   By: Awilda Metroourtnay  Bloomer M.D.   On: 06/27/2016 00:45    Procedures Procedures (including critical care time)  Medications Ordered in ED Medications  clindamycin (CLEOCIN) capsule 300 mg (300 mg Oral Given 06/27/16 0947)  HYDROcodone-acetaminophen (NORCO/VICODIN) 5-325 MG per tablet 1 tablet (not administered)  lisinopril (PRINIVIL,ZESTRIL) tablet 5 mg (5 mg Oral Given 06/27/16 0947)  methocarbamol (ROBAXIN) tablet 500 mg (not administered)  gabapentin (NEURONTIN) capsule 200 mg (200 mg Oral Given 06/27/16 0947)  pantoprazole (PROTONIX) EC tablet 40 mg (40 mg Oral Not Given 06/27/16 0948)  loratadine (CLARITIN) tablet 10 mg (10 mg Oral Given 06/27/16 0948)  simvastatin (ZOCOR) tablet 20 mg (20 mg Oral Given 06/27/16 0948)  fluticasone (FLONASE) 50 MCG/ACT nasal spray 2 spray (2 sprays Each Nare Given 06/27/16 0947)  guaiFENesin (MUCINEX) 12 hr tablet 1,200 mg (1,200 mg Oral Given 06/27/16 0947)  enoxaparin (LOVENOX) injection 40 mg (40  mg Subcutaneous Given 06/27/16 0306)  ondansetron (ZOFRAN) tablet 4 mg (not administered)    Or  ondansetron (ZOFRAN) injection 4 mg (not administered)  predniSONE (DELTASONE) tablet 40 mg (40 mg Oral Given 06/27/16 0948)  sodium chloride 0.9 % bolus 1,000 mL (0 mLs Intravenous Stopped 06/26/16 2322)  diphenhydrAMINE (BENADRYL) injection 50 mg (50 mg Intravenous Given 06/26/16 2207)  famotidine (PEPCID) IVPB 20 mg premix (0 mg Intravenous Stopped 06/26/16 2307)  methylPREDNISolone sodium succinate (SOLU-MEDROL) 125 mg/2 mL injection 125 mg (125 mg Intravenous Given 06/26/16 2207)  potassium chloride SA (K-DUR,KLOR-CON) CR tablet 20 mEq (20 mEq Oral Given 06/26/16 2312)  potassium chloride 10 mEq in 100 mL IVPB (0 mEq Intravenous Stopped 06/27/16 0550)  iopamidol (ISOVUE-300) 61 % injection 75 mL (75 mLs Intravenous Contrast Given 06/27/16 0016)  clindamycin (CLEOCIN) IVPB 600 mg (0 mg Intravenous Stopped 06/27/16 0249)     Initial Impression / Assessment and Plan / ED Course  I have reviewed the triage vital signs and the nursing notes.  Pertinent labs & imaging results that were available during my care of the patient were reviewed by me and considered in my medical decision making (see chart for details).  Clinical Course   Tomasita Crumbleracey S Cruces is a 50 y.o. female with a Hx of CHF, who presents to the Emergency Department complaining of gradually worsening throat swelling and and difficulty swallowing that started today around noon today.  History and exam are seen above.  Exam showed swelling and tenderness under the jaw with associated redness in the neck area. Due to swelling being located centrally under middle of chin, and patient's concern of difficulty swallowing is worsening, clinical concern for a infection in the neck. Initial concern for cellulitis versus early developing Ludwig's angina. No lip or tongue swelling was seen however, given the patient's concern for allergic reaction,  patient and steroids, Benadryl, and Pepcid.  Patient had laboratory testing and CT scan to evaluate for infection.  CT scan shows soft tissue swelling and persistent Lymphoid hypertrophy. There is also a small left mastoid effusion, likely for which the patient was being treated with the Abx.   Laboratory testing showed Elevated lactic acid, leukocytosis, and hypokalemia. Patient given potassium supplementation orally and intravenously. Patient given antibiotics due to concern for cellulitis of the neck. It is reassuring there is no abscess. Doubt meningitis based on exam.  Due to how the patient looked on arrival, her electrolyte abnormalities, and concern for this worsening preventing her taking medications, patient will be admitted and observed overnight. Patient felt better after steroids and antibiotics. Patient admitted in stable condition.    Final Clinical Impressions(s) / ED Diagnoses   Final diagnoses:  Allergic reaction, initial encounter    I personally performed the services described in this documentation, which was scribed in my presence. The recorded information has been reviewed and is accurate.    Clinical Impression: 1. Allergic reaction, initial encounter     Disposition: Admit to Hospitalist Service    Heide Scales, MD 06/27/16 1125

## 2016-06-27 DIAGNOSIS — J329 Chronic sinusitis, unspecified: Secondary | ICD-10-CM | POA: Diagnosis present

## 2016-06-27 DIAGNOSIS — E876 Hypokalemia: Secondary | ICD-10-CM

## 2016-06-27 DIAGNOSIS — J0100 Acute maxillary sinusitis, unspecified: Secondary | ICD-10-CM

## 2016-06-27 LAB — CBC
HCT: 39.1 % (ref 36.0–46.0)
Hemoglobin: 13.1 g/dL (ref 12.0–15.0)
MCH: 29.7 pg (ref 26.0–34.0)
MCHC: 33.5 g/dL (ref 30.0–36.0)
MCV: 88.7 fL (ref 78.0–100.0)
PLATELETS: 226 10*3/uL (ref 150–400)
RBC: 4.41 MIL/uL (ref 3.87–5.11)
RDW: 12.7 % (ref 11.5–15.5)
WBC: 6.9 10*3/uL (ref 4.0–10.5)

## 2016-06-27 LAB — COMPREHENSIVE METABOLIC PANEL
ALBUMIN: 3.2 g/dL — AB (ref 3.5–5.0)
ALT: 19 U/L (ref 14–54)
AST: 21 U/L (ref 15–41)
Alkaline Phosphatase: 51 U/L (ref 38–126)
Anion gap: 5 (ref 5–15)
BUN: 15 mg/dL (ref 6–20)
CHLORIDE: 107 mmol/L (ref 101–111)
CO2: 24 mmol/L (ref 22–32)
CREATININE: 1.08 mg/dL — AB (ref 0.44–1.00)
Calcium: 8.1 mg/dL — ABNORMAL LOW (ref 8.9–10.3)
GFR calc Af Amer: >60 mL/min (ref >60)
GFR calc non Af Amer: 59 mL/min — ABNORMAL LOW (ref >60)
GLUCOSE: 220 mg/dL — AB (ref 65–99)
Potassium: 3.5 mmol/L (ref 3.5–5.1)
SODIUM: 136 mmol/L (ref 135–145)
Total Bilirubin: 0.5 mg/dL (ref 0.3–1.2)
Total Protein: 6.4 g/dL — ABNORMAL LOW (ref 6.5–8.1)

## 2016-06-27 LAB — I-STAT CG4 LACTIC ACID, ED: Lactic Acid, Venous: 1.98 mmol/L (ref 0.5–1.9)

## 2016-06-27 MED ORDER — CLINDAMYCIN PHOSPHATE 600 MG/50ML IV SOLN
600.0000 mg | Freq: Once | INTRAVENOUS | Status: AC
  Administered 2016-06-27: 600 mg via INTRAVENOUS
  Filled 2016-06-27: qty 50

## 2016-06-27 MED ORDER — LORATADINE 10 MG PO TABS
10.0000 mg | ORAL_TABLET | Freq: Every day | ORAL | Status: DC
  Administered 2016-06-27: 10 mg via ORAL
  Filled 2016-06-27: qty 1

## 2016-06-27 MED ORDER — GUAIFENESIN ER 600 MG PO TB12
1200.0000 mg | ORAL_TABLET | Freq: Two times a day (BID) | ORAL | Status: DC
  Administered 2016-06-27: 1200 mg via ORAL
  Filled 2016-06-27 (×3): qty 2

## 2016-06-27 MED ORDER — PREDNISONE 10 MG PO TABS: 10.0000 mg | ORAL_TABLET | Freq: Every day | ORAL | 0 refills | Status: DC

## 2016-06-27 MED ORDER — PREDNISONE 20 MG PO TABS
40.0000 mg | ORAL_TABLET | Freq: Every day | ORAL | Status: DC
  Administered 2016-06-27: 40 mg via ORAL
  Filled 2016-06-27: qty 2

## 2016-06-27 MED ORDER — FLUTICASONE PROPIONATE 50 MCG/ACT NA SUSP
2.0000 | Freq: Every day | NASAL | Status: DC | PRN
  Administered 2016-06-27: 2 via NASAL
  Filled 2016-06-27: qty 16

## 2016-06-27 MED ORDER — ONDANSETRON HCL 4 MG PO TABS: 4.0000 mg | ORAL_TABLET | Freq: Four times a day (QID) | ORAL | Status: DC | PRN

## 2016-06-27 MED ORDER — SIMVASTATIN 10 MG PO TABS
20.0000 mg | ORAL_TABLET | Freq: Every day | ORAL | Status: DC
  Administered 2016-06-27: 20 mg via ORAL
  Filled 2016-06-27: qty 2

## 2016-06-27 MED ORDER — LISINOPRIL 5 MG PO TABS
5.0000 mg | ORAL_TABLET | Freq: Every day | ORAL | Status: DC
  Administered 2016-06-27: 5 mg via ORAL
  Filled 2016-06-27: qty 1

## 2016-06-27 MED ORDER — METHOCARBAMOL 500 MG PO TABS: 500.0000 mg | ORAL_TABLET | Freq: Four times a day (QID) | ORAL | Status: DC | PRN

## 2016-06-27 MED ORDER — SODIUM CHLORIDE 0.9 % IV SOLN: INTRAVENOUS | Status: DC

## 2016-06-27 MED ORDER — CLINDAMYCIN HCL 150 MG PO CAPS
300.0000 mg | ORAL_CAPSULE | Freq: Three times a day (TID) | ORAL | Status: DC
  Administered 2016-06-27: 300 mg via ORAL
  Filled 2016-06-27: qty 2

## 2016-06-27 MED ORDER — GABAPENTIN 100 MG PO CAPS
200.0000 mg | ORAL_CAPSULE | Freq: Three times a day (TID) | ORAL | Status: DC
  Administered 2016-06-27: 200 mg via ORAL
  Filled 2016-06-27: qty 2

## 2016-06-27 MED ORDER — PANTOPRAZOLE SODIUM 40 MG PO TBEC
40.0000 mg | DELAYED_RELEASE_TABLET | Freq: Every day | ORAL | Status: DC
  Filled 2016-06-27: qty 1

## 2016-06-27 MED ORDER — ENOXAPARIN SODIUM 40 MG/0.4ML ~~LOC~~ SOLN
40.0000 mg | SUBCUTANEOUS | Status: DC
  Administered 2016-06-27: 40 mg via SUBCUTANEOUS
  Filled 2016-06-27: qty 0.4

## 2016-06-27 MED ORDER — IOPAMIDOL (ISOVUE-300) INJECTION 61%
75.0000 mL | Freq: Once | INTRAVENOUS | Status: AC | PRN
  Administered 2016-06-27: 75 mL via INTRAVENOUS

## 2016-06-27 MED ORDER — ONDANSETRON HCL 4 MG/2ML IJ SOLN: 4.0000 mg | Freq: Four times a day (QID) | INTRAMUSCULAR | Status: DC | PRN

## 2016-06-27 MED ORDER — HYDROCODONE-ACETAMINOPHEN 5-325 MG PO TABS: 1.0000 | ORAL_TABLET | Freq: Three times a day (TID) | ORAL | Status: DC | PRN

## 2016-06-27 NOTE — ED Notes (Signed)
Pt back from x-ray.

## 2016-06-27 NOTE — ED Notes (Signed)
PT disposition set to d/c to home after hospitalist in to assess pt and pt sent home with prescription for prednisone and to continue her home antibiotic.

## 2016-06-27 NOTE — Discharge Summary (Signed)
Physician Discharge Summary  Pamela Mendez MWU:[id number] DOB: 15-Aug-1966 DOA: 06/26/2016  PCP: Lilyan PuntScott Luking, MD  Admit date: 06/26/2016 Discharge date: 06/27/2016  Time spent: 45 minutes  Recommendations for Outpatient Follow-up:  -Will be discharged home today. -Advised to follow up with PCP in 2 weeks.   Discharge Diagnoses:  Active Problems:   Hypokalemia   Sinusitis Difficulty swallowing  Discharge Condition: Stable and improved  Filed Weights   06/26/16 2117  Weight: 92.5 kg (204 lb)    History of present illness:  As per Dr. Sharl MaLama on 10/24: Pamela Mendez  is a 50 y.o. female, With history of CHF, chronic sinusitis came with throat swelling and difficulty swallowing which started at around noontime today. Patient was prescribed doxepin by her PCP for sinusitis and today it got changed to clindamycin. He says that before starting the clindamycin she started having difficulty swallowing and felt her throat was more swollen.  In the ED CT soft tissue neck was obtained which did not show any abscess or significant edema, but showed mild lymphoid hypertrophy. Patient received Solu-Medrol, clindamycin in the ED. She denies shortness of breath, no chest pain. Her symptoms have improved since she received Solu-Medrol.   Hospital Course:   Difficulty swallowing -She noticed this after taking a few doses of doxycycline. -CT soft neck shows mild lymphoid hypertrophy but no abscess or edema. -Patient states this has resolved and would like to be discharged home. -Will send home on a steroid taper.  Rest of chronic medical issues have been stable. Home medications have not changed.  Procedures:  None   Consultations:  None  Discharge Instructions  Discharge Instructions    Diet - low sodium heart healthy    Complete by:  As directed    Increase activity slowly    Complete by:  As directed        Medication List    STOP taking these medications     doxycycline 100 MG tablet Commonly known as:  VIBRA-TABS     TAKE these medications   cetirizine 10 MG tablet Commonly known as:  ZYRTEC TAKE ONE TABLET BY MOUTH AT BEDTIME AS NEEDED FOR ALLERGIES.   clindamycin 300 MG capsule Commonly known as:  CLEOCIN Take 1 capsule (300 mg total) by mouth 3 (three) times daily. X 7 days   fluticasone 50 MCG/ACT nasal spray Commonly known as:  FLONASE Place 2 sprays into both nostrils daily. What changed:  when to take this  reasons to take this   gabapentin 100 MG capsule Commonly known as:  NEURONTIN 2 po TID for nerve pain What changed:  how much to take  how to take this  when to take this  additional instructions   HYDROcodone-acetaminophen 5-325 MG tablet Commonly known as:  NORCO/VICODIN One tid prn prn pain What changed:  how much to take  how to take this  when to take this  reasons to take this  additional instructions   ICY HOT 7.5 % (Roll) Misc Generic drug:  Menthol (Topical Analgesic) Apply 1 each topically as needed (Pain).   lisinopril 5 MG tablet Commonly known as:  PRINIVIL,ZESTRIL Take 1 tablet (5 mg total) by mouth daily.   methocarbamol 500 MG tablet Commonly known as:  ROBAXIN TAKE 1 TABLET BY MOUTH EVERY 6 HOURS AS NEEDED FOR PAIN/MUSCLE SPASMS. What changed:  how much to take  how to take this  when to take this  reasons to take this  additional instructions  Olopatadine HCl 0.2 % Soln Apply 1 drop to eye at bedtime as needed. What changed:  additional instructions   pantoprazole 40 MG tablet Commonly known as:  PROTONIX TAKE ONE TABLET BY MOUTH DAILY.   potassium chloride SA 20 MEQ tablet Commonly known as:  KLOR-CON M20 Take 2 tablets qam 2 tablets at noon and 2 in the evening. What changed:  how much to take  how to take this  when to take this  additional instructions   predniSONE 10 MG tablet Commonly known as:  DELTASONE Take 1 tablet (10 mg total) by  mouth daily with breakfast. Take 6 tablets today and then decrease by 1 tablet daily until none are left.   simvastatin 20 MG tablet Commonly known as:  ZOCOR TAKE ONE TABLET BY MOUTH DAILY.   torsemide 20 MG tablet Commonly known as:  DEMADEX TAKE THREE TABLETS BY MOUTH IN THE MORNING AND TWO AT NOON What changed:  how much to take  how to take this  when to take this  additional instructions      Allergies  Allergen Reactions  . Cefzil [Cefprozil] Anaphylaxis and Swelling  . Sulfa Antibiotics Anaphylaxis and Swelling  . Augmentin [Amoxicillin-Pot Clavulanate] Other (See Comments)    Headache   . Avelox [Moxifloxacin Hcl In Nacl] Other (See Comments)    Joint/muscle pain   Follow-up Information    Lilyan Punt, MD. Schedule an appointment as soon as possible for a visit in 2 week(s).   Specialty:  Family Medicine Contact information: [address] [place] [address] [phone]            The results of significant diagnostics from this hospitalization (including imaging, microbiology, ancillary and laboratory) are listed below for reference.    Significant Diagnostic Studies: Ct Soft Tissue Neck W Contrast  Result Date: 06/27/2016 CLINICAL DATA:  Dysphagia, allergic reaction to antibiotics yesterday. History of CHF, hypertension. EXAM: CT NECK WITH CONTRAST TECHNIQUE: Multidetector CT imaging of the neck was performed using the standard protocol following the bolus administration of intravenous contrast. CONTRAST:  65mL ISOVUE-300 IOPAMIDOL (ISOVUE-300) INJECTION 61% COMPARISON:  MRI cervical spine December 16, 2004 FINDINGS: Pharynx and larynx: : Fullness of the nasopharyngeal soft tissues. Mildly prominent palatine tonsils without inflammation. RIGHT spot solid. Prominent RIGHT lingual tonsil partially effacing the RIGHT vallecula. Normal larynx, apposition the true vocal cords most compatible with inflammation. Salivary glands: Normal. Thyroid:  Normal. Lymph nodes: 10 mm short access RIGHT level IIa and LEFT level IIa lymph nodes, additional smaller scattered lymph nodes. Vascular: Normal. Limited intracranial: Normal. Visualized orbits: Normal. Mastoids and visualized paranasal sinuses: Minimal LEFT mastoid effusion. Lobulated soft tissue LEFT middle ear and additus ad antrum. Paranasal sinuses are well-aerated. Skeleton: Broad reversed cervical lordosis. No destructive bony lesions. Multiple molar dental caries. Upper chest: Lung apices are clear. Sub cm anterior mediastinal lymph nodes, likely reactive. Other: None. IMPRESSION: Mild lymphoid hypertrophy can be seen with recent viral illness, or immunocompromised states. Patent airway. Small LEFT mastoid effusion and lobulated soft tissue in middle ear, cholesteatoma could have this appearance. CT temporal bones on a nonemergent basis is recommended. Electronically Signed   By: Awilda Metro M.D.   On: 06/27/2016 00:45    Microbiology: No results found for this or any previous visit (from the past 240 hour(s)).   Labs: Basic Metabolic Panel:  Recent Labs Lab 06/26/16 2130 06/27/16 0534  NA 138 136  K 2.7* 3.5  CL 100* 107  CO2 28  24  GLUCOSE 167* 220*  BUN 14 15  CREATININE 1.17* 1.08*  CALCIUM 8.8* 8.1*   Liver Function Tests:  Recent Labs Lab 06/27/16 0534  AST 21  ALT 19  ALKPHOS 51  BILITOT 0.5  PROT 6.4*  ALBUMIN 3.2*   No results for input(s): LIPASE, AMYLASE in the last 168 hours. No results for input(s): AMMONIA in the last 168 hours. CBC:  Recent Labs Lab 06/26/16 2130 06/27/16 0534  WBC 10.9* 6.9  NEUTROABS 5.1  --   HGB 14.1 13.1  HCT 41.8 39.1  MCV 88.7 88.7  PLT 273 226   Cardiac Enzymes: No results for input(s): CKTOTAL, CKMB, CKMBINDEX, TROPONINI in the last 168 hours. BNP: BNP (last 3 results) No results for input(s): BNP in the last 8760 hours.  ProBNP (last 3 results) No results for input(s): PROBNP in the last 8760  hours.  CBG: No results for input(s): GLUCAP in the last 168 hours.     SignedChaya Jan  [facility] Pager: [phone] 06/27/2016, 11:32 AM

## 2016-06-27 NOTE — H&P (Signed)
TRH H&P    Patient Demographics:    Pamela Mendez, is a 50 y.o. female  MRN: [id number]  DOB - 02/11/1966  Admit Date - 06/26/2016  Referring MD/NP/PA: Dr. Julieanne Manson  Outpatient Primary MD for the patient is Lilyan Punt, MD  Patient coming from: Home  Chief Complaint  Patient presents with  . Allergic Reaction      HPI:    Pamela Mendez  is a 50 y.o. female, With history of CHF, chronic sinusitis came with throat swelling and difficulty swallowing which started at around noontime today. Patient was prescribed doxepin by her PCP for sinusitis and today it got changed to clindamycin. He says that before starting the clindamycin she started having difficulty swallowing and felt her throat was more swollen.  In the ED CT soft tissue neck was obtained which did not show any abscess or significant edema, but showed mild lymphoid hypertrophy. Patient received Solu-Medrol, clindamycin in the ED. She denies shortness of breath, no chest pain. Her symptoms have improved since she received Solu-Medrol.     Review of systems:    In addition to the HPI above,  No Fever-chills, No Headache, No changes with Vision or hearing, No Chest pain, Cough or Shortness of Breath, No Abdominal pain, No Nausea or Vomiting, bowel movements are regular, No Blood in stool or Urine, No dysuria, No new skin rashes or bruises, No new joints pains-aches,  No new weakness, tingling, numbness in any extremity, .  A full 10 point Review of Systems was done, except as stated above, all other Review of Systems were negative.   With Past History of the following :    Past Medical History:  Diagnosis Date  . Abdominal pain    Unspecified site  . Anxiety   . Anxiety and depression   . 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 CHF (congestive heart failure) (HCC) 2004   post-partum  . Chronic back pain   . Chronic left hip  pain   . Depression   . Gastritis   . GERD (gastroesophageal reflux disease)   . H/O echocardiogram 2012   EF 55-60%  . Hyperlipidemia   . Hypertension   . Migraine headache without aura       Past Surgical History:  Procedure Laterality Date  . Childbirth     time 3  . CHOLECYSTECTOMY  [address] , Dr Katrinka Blazing  . LUMBAR DISC SURGERY  2010   [facility], Dr Marikay Alar  . LUMBAR DISC SURGERY  2005   [place], Dr Marikay Alar      Social History:      Social History  Substance Use Topics  . Smoking status: Former Smoker    Packs/day: 0.25    Years: 15.00    Types: Cigarettes    Quit date: 08/23/2013  . Smokeless tobacco: Never Used  . Alcohol use No       Family History :     Family History  Problem Relation Age of Onset  . Heart  attack Father   . Anesthesia problems Neg Hx   . Hypotension Neg Hx   . Malignant hyperthermia Neg Hx   . Pseudochol deficiency Neg Hx       Home Medications:   Prior to Admission medications   Medication Sig Start Date End Date Taking? Authorizing Provider  cetirizine (ZYRTEC) 10 MG tablet TAKE ONE TABLET BY MOUTH AT BEDTIME AS NEEDED FOR ALLERGIES. 03/20/16  Yes Babs Sciara, MD  clindamycin (CLEOCIN) 300 MG capsule Take 1 capsule (300 mg total) by mouth 3 (three) times daily. X 7 days 06/26/16  Yes Babs Sciara, MD  fluticasone (FLONASE) 50 MCG/ACT nasal spray Place 2 sprays into both nostrils daily. Patient taking differently: Place 2 sprays into both nostrils daily as needed for allergies.  10/07/15  Yes Babs Sciara, MD  gabapentin (NEURONTIN) 100 MG capsule 2 po TID for nerve pain Patient taking differently: Take 200 mg by mouth 3 (three) times daily. for nerve pain 04/14/16  Yes Campbell Riches, NP  HYDROcodone-acetaminophen (NORCO/VICODIN) 5-325 MG tablet One tid prn prn pain Patient taking differently: Take 1 tablet by mouth 3 (three) times daily as needed for moderate pain or severe pain. One tid prn prn pain  06/22/16  Yes Babs Sciara, MD  lisinopril (PRINIVIL,ZESTRIL) 5 MG tablet Take 1 tablet (5 mg total) by mouth daily. 06/06/16  Yes Babs Sciara, MD  Menthol, Topical Analgesic, (ICY HOT) 7.5 % (Roll) MISC Apply 1 each topically as needed (Pain).   Yes Historical Provider, MD  methocarbamol (ROBAXIN) 500 MG tablet TAKE 1 TABLET BY MOUTH EVERY 6 HOURS AS NEEDED FOR PAIN/MUSCLE SPASMS. Patient taking differently: Take 500 mg by mouth every 6 (six) hours as needed. FOR PAIN/MUSCLE SPASMS. 06/06/16  Yes Babs Sciara, MD  Olopatadine HCl 0.2 % SOLN Apply 1 drop to eye at bedtime as needed. Patient taking differently: Apply 1 drop to eye at bedtime as needed. For allergy/dry eye relief 01/04/16  Yes Babs Sciara, MD  pantoprazole (PROTONIX) 40 MG tablet TAKE ONE TABLET BY MOUTH DAILY. 04/05/16  Yes Babs Sciara, MD  potassium chloride SA (KLOR-CON M20) 20 MEQ tablet Take 2 tablets qam 2 tablets at noon and 2 in the evening. Patient taking differently: Take 40 mEq by mouth 3 (three) times daily.  10/07/15  Yes Babs Sciara, MD  simvastatin (ZOCOR) 20 MG tablet TAKE ONE TABLET BY MOUTH DAILY. 04/05/16  Yes Babs Sciara, MD  torsemide (DEMADEX) 20 MG tablet TAKE THREE TABLETS BY MOUTH IN THE MORNING AND TWO AT NOON Patient taking differently: Take 40-60 mg by mouth 2 (two) times daily. TAKE THREE TABLETS BY MOUTH IN THE MORNING AND TWO AT NOON 10/07/15  Yes Babs Sciara, MD  doxycycline (VIBRA-TABS) 100 MG tablet Take 1 tablet (100 mg total) by mouth 2 (two) times daily. Patient not taking: Reported on 06/26/2016 06/22/16   Babs Sciara, MD     Allergies:     Allergies  Allergen Reactions  . Cefzil [Cefprozil] Anaphylaxis and Swelling  . Sulfa Antibiotics Anaphylaxis and Swelling  . Augmentin [Amoxicillin-Pot Clavulanate] Other (See Comments)    Headache   . Avelox [Moxifloxacin Hcl In Nacl] Other (See Comments)    Joint/muscle pain     Physical Exam:   Vitals  Blood pressure 106/59,  pulse 74, temperature 97.5 F (36.4 C), temperature source Oral, resp. rate 15, height 5\' 6"  (1.676 m), weight 92.5 kg (204 lb), SpO2 97 %.  1.  General: Appears in no acute distress  2. Psychiatric:  Intact judgement and  insight, awake alert, oriented x 3.  3. Neurologic: No focal neurological deficits, all cranial nerves intact.Strength 5/5 all 4 extremities, sensation intact all 4 extremities, plantars down going.  4. Eyes :  anicteric sclerae, moist conjunctivae with no lid lag. PERRLA.  5. ENMT:  Oropharynx clear with moist mucous membranes and good dentition. Mild pharyngeal edema noted  6. Neck:  supple, no cervical lymphadenopathy appriciated, No thyromegaly  7. Respiratory : Normal respiratory effort, good air movement bilaterally,clear to  auscultation bilaterally  8. Cardiovascular : RRR, no gallops, rubs or murmurs, no leg edema  9. Gastrointestinal:  Positive bowel sounds, abdomen soft, non-tender to palpation,no hepatosplenomegaly, no rigidity or guarding       10. Skin:  Erythematous rash noted on the anterior chest wall ( chronic as per patient)  11.Musculoskeletal:  Good muscle tone,  joints appear normal , no effusions,  normal range of motion    Data Review:    CBC  Recent Labs Lab 06/26/16 2130  WBC 10.9*  HGB 14.1  HCT 41.8  PLT 273  MCV 88.7  MCH 29.9  MCHC 33.7  RDW 12.7  LYMPHSABS 4.5*  MONOABS 0.9  EOSABS 0.4  BASOSABS 0.1   ------------------------------------------------------------------------------------------------------------------  Chemistries   Recent Labs Lab 06/26/16 2130  NA 138  K 2.7*  CL 100*  CO2 28  GLUCOSE 167*  BUN 14  CREATININE 1.17*  CALCIUM 8.8*   ------------------------------------------------------------------------------- ---------------------------------------------------------------------------------------------------------------    Imaging Results:    Ct Soft Tissue Neck W  Contrast  Result Date: 06/27/2016 CLINICAL DATA:  Dysphagia, allergic reaction to antibiotics yesterday. History of CHF, hypertension. EXAM: CT NECK WITH CONTRAST TECHNIQUE: Multidetector CT imaging of the neck was performed using the standard protocol following the bolus administration of intravenous contrast. CONTRAST:  63mL ISOVUE-300 IOPAMIDOL (ISOVUE-300) INJECTION 61% COMPARISON:  MRI cervical spine December 16, 2004 FINDINGS: Pharynx and larynx: : Fullness of the nasopharyngeal soft tissues. Mildly prominent palatine tonsils without inflammation. RIGHT spot solid. Prominent RIGHT lingual tonsil partially effacing the RIGHT vallecula. Normal larynx, apposition the true vocal cords most compatible with inflammation. Salivary glands: Normal. Thyroid: Normal. Lymph nodes: 10 mm short access RIGHT level IIa and LEFT level IIa lymph nodes, additional smaller scattered lymph nodes. Vascular: Normal. Limited intracranial: Normal. Visualized orbits: Normal. Mastoids and visualized paranasal sinuses: Minimal LEFT mastoid effusion. Lobulated soft tissue LEFT middle ear and additus ad antrum. Paranasal sinuses are well-aerated. Skeleton: Broad reversed cervical lordosis. No destructive bony lesions. Multiple molar dental caries. Upper chest: Lung apices are clear. Sub cm anterior mediastinal lymph nodes, likely reactive. Other: None. IMPRESSION: Mild lymphoid hypertrophy can be seen with recent viral illness, or immunocompromised states. Patent airway. Small LEFT mastoid effusion and lobulated soft tissue in middle ear, cholesteatoma could have this appearance. CT temporal bones on a nonemergent basis is recommended. Electronically Signed   By: Awilda Metro M.D.   On: 06/27/2016 00:45       Assessment & Plan:    Active Problems:   Hypokalemia   Sinusitis   1. ? Pharyngeal edema- unclear cause, patient was taking doxycycline for sinusitis when the symptoms appeared. Antibiotics were changed by PCP.  Swelling has improved with Solu-Medrol, will start prednisone 40 mg daily. Patient can be discharged home on prednisone taper once medically stable. 2. Chronic sinusitis- patient started on clindamycin, will continue clindamycin 300 mg by mouth every 8 hours. Start  Mucinex 1200 mg by mouth twice a day. 3. Congestive heart failure- patient has history of CHF, takes Demadex 60 mg in the morning and 40 mg in afternoon. Will hold Demadex this time as patient blood pressure soft. Will avoid aggressive IV hydration due to underlying history of CHF. 4. Hypokalemia-  likely from diuretic use, will replace potassium and check BMP in a.m.   DVT Prophylaxis-   Lovenox   AM Labs Ordered, also please review Full Orders  Family Communication: Admission, patients condition and plan of care including tests being ordered have been discussed with the patient and her husband at bedside  who indicate understanding and agree with the plan and Code Status.  Code Status:  Full code  Admission status: Observation    Time spent in minutes : 60 minutes   Zaraya Delauder S M.D on 06/27/2016 at 2:27 AM  Between 7am to 7pm - Pager - [phone]. After 7pm go to [email or web address] - password [email or web address]  [facility] - Office  [phone]

## 2016-06-27 NOTE — Progress Notes (Signed)
Inpatient Diabetes Program Recommendations  AACE/ADA: New Consensus Statement on Inpatient Glycemic Control (2015)  Target Ranges:  Prepandial:   less than 140 mg/dL      Peak postprandial:   less than 180 mg/dL (1-2 hours)      Critically ill patients:  140 - 180 mg/dL   Results for TEDRA, SELZLER (MRN [id number]) as of 06/27/2016 10:32  Ref. Range 06/26/2016 21:30 06/27/2016 05:34  Glucose Latest Ref Range: 65 - 99 mg/dL 401 (H) 027 (H)   Review of Glycemic Control  Diabetes history: No Outpatient Diabetes medications: NA Current orders for Inpatient glycemic control: None  Inpatient Diabetes Program Recommendations:  Correction (SSI): While inpatient and ordered steroids, please consider ordering CBGs with Novolog correction scale ACHS. HgbA1C: Please consider ordering an A1C to evaluate glycemic control over the past 2-3 months.  Thanks, Orlando Penner, RN, MSN, CDE Diabetes Coordinator Inpatient Diabetes Program [phone] (Team Pager from 8am to 5pm) [phone] (AP office) [phone] [facility] office) [phone] [facility] office)

## 2016-07-07 ENCOUNTER — Other Ambulatory Visit: Payer: Self-pay | Admitting: Family Medicine

## 2016-07-14 ENCOUNTER — Ambulatory Visit: Payer: Medicaid Other | Admitting: Nurse Practitioner

## 2016-08-10 ENCOUNTER — Other Ambulatory Visit: Payer: Self-pay | Admitting: Family Medicine

## 2016-08-10 NOTE — Telephone Encounter (Signed)
Please call in find out when her last pain prescription was filled by [facility]. Patient has appointment in early January. She may have a refill of the medication to get her through until she is seen, may have 90 tablets one 3 times a day, date it appropriately 30 days after the last one she got

## 2016-08-14 NOTE — Telephone Encounter (Signed)
Patient filled prescription on 08/11/16 per pharmacy

## 2016-08-17 ENCOUNTER — Ambulatory Visit (INDEPENDENT_AMBULATORY_CARE_PROVIDER_SITE_OTHER): Payer: Medicaid Other | Admitting: Nurse Practitioner

## 2016-08-17 VITALS — BP 136/84 | Temp 98.8°F | Ht 66.0 in | Wt 208.6 lb

## 2016-08-17 DIAGNOSIS — J029 Acute pharyngitis, unspecified: Secondary | ICD-10-CM

## 2016-08-17 DIAGNOSIS — L309 Dermatitis, unspecified: Secondary | ICD-10-CM

## 2016-08-17 LAB — POCT RAPID STREP A (OFFICE): RAPID STREP A SCREEN: NEGATIVE

## 2016-08-17 MED ORDER — AZITHROMYCIN 250 MG PO TABS: ORAL_TABLET | ORAL | 0 refills | Status: DC

## 2016-08-17 MED ORDER — TRIAMCINOLONE ACETONIDE 0.1 % EX CREA: 1.0000 "application " | TOPICAL_CREAM | Freq: Two times a day (BID) | CUTANEOUS | 0 refills | Status: DC

## 2016-08-18 ENCOUNTER — Encounter: Payer: Self-pay | Admitting: Nurse Practitioner

## 2016-08-18 NOTE — Progress Notes (Signed)
Subjective:  Presents for complaints of sinus symptoms over the past 2 days. Some fever. Sinus pressure. Facial area pressure. Ear pressure. Slight cough. Slight sore throat. Also mentions recurrent itching and slight rash on the left upper inner arm. No other rash is been noted. No wheezing.  Objective:   BP 136/84   Temp 98.8 F (37.1 C) (Oral)   Ht 5\' 6"  (1.676 m)   Wt 208 lb 9.6 oz (94.6 kg)   BMI 33.67 kg/m  NAD. Alert, oriented. TMs clear effusion, no erythema. Pharynx mildly injected with PND noted. Neck supple with mild soft anterior adenopathy. Lungs clear. Heart regular rate rhythm. Fine dry patch of skin noted on the left upper inner arm with minimally raised papules. Skin otherwise clear but significant sun damage noted.  Assessment: Acute pharyngitis, unspecified etiology - Plan: POCT rapid strep A, Culture, Group A Strep  Eczema of left upper extremity   Plan:  Meds ordered this encounter  Medications  . azithromycin (ZITHROMAX Z-PAK) 250 MG tablet    Sig: Take 2 tablets (500 mg) on  Day 1,  followed by 1 tablet (250 mg) once daily on Days 2 through 5.    Dispense:  6 each    Refill:  0    Order Specific Question:   Supervising Provider    Answer:   Merlyn Albert [[id number]]  . triamcinolone cream (KENALOG) 0.1 %    Sig: Apply 1 application topically 2 (two) times daily. Prn rash; use up to 2 weeks    Dispense:  30 g    Refill:  0    Order Specific Question:   Supervising Provider    Answer:   Riccardo Dubin   Given prescription for Z-Pak to have over the weekend in case symptoms worsen. OTC meds as directed for congestion and cough. Trial of triamcinolone up to 2 weeks at a time for rash on her arm. Call back if any symptoms worsen or persist.

## 2016-08-22 LAB — CULTURE, GROUP A STREP

## 2016-09-07 ENCOUNTER — Encounter: Payer: Self-pay | Admitting: Family Medicine

## 2016-09-07 ENCOUNTER — Ambulatory Visit (INDEPENDENT_AMBULATORY_CARE_PROVIDER_SITE_OTHER): Payer: Medicaid Other | Admitting: Family Medicine

## 2016-09-07 VITALS — BP 110/74 | Ht 66.0 in | Wt 210.0 lb

## 2016-09-07 DIAGNOSIS — R600 Localized edema: Secondary | ICD-10-CM | POA: Diagnosis not present

## 2016-09-07 DIAGNOSIS — Z79899 Other long term (current) drug therapy: Secondary | ICD-10-CM | POA: Diagnosis not present

## 2016-09-07 DIAGNOSIS — G8929 Other chronic pain: Secondary | ICD-10-CM | POA: Diagnosis not present

## 2016-09-07 DIAGNOSIS — E7849 Other hyperlipidemia: Secondary | ICD-10-CM

## 2016-09-07 DIAGNOSIS — M5442 Lumbago with sciatica, left side: Secondary | ICD-10-CM | POA: Diagnosis not present

## 2016-09-07 DIAGNOSIS — E784 Other hyperlipidemia: Secondary | ICD-10-CM

## 2016-09-07 DIAGNOSIS — K219 Gastro-esophageal reflux disease without esophagitis: Secondary | ICD-10-CM

## 2016-09-07 MED ORDER — PANTOPRAZOLE SODIUM 40 MG PO TBEC: 40.0000 mg | DELAYED_RELEASE_TABLET | Freq: Every day | ORAL | 5 refills | Status: DC

## 2016-09-07 MED ORDER — HYDROCODONE-ACETAMINOPHEN 5-325 MG PO TABS: ORAL_TABLET | ORAL | 0 refills | Status: DC

## 2016-09-07 MED ORDER — FLUTICASONE PROPIONATE 50 MCG/ACT NA SUSP: 2.0000 | Freq: Every day | NASAL | 5 refills | Status: DC

## 2016-09-07 MED ORDER — TORSEMIDE 20 MG PO TABS: ORAL_TABLET | ORAL | 5 refills | Status: DC

## 2016-09-07 MED ORDER — CETIRIZINE HCL 10 MG PO TABS: ORAL_TABLET | ORAL | 5 refills | Status: DC

## 2016-09-07 MED ORDER — LISINOPRIL 5 MG PO TABS: 5.0000 mg | ORAL_TABLET | Freq: Every day | ORAL | 5 refills | Status: DC

## 2016-09-07 MED ORDER — SIMVASTATIN 20 MG PO TABS: 20.0000 mg | ORAL_TABLET | Freq: Every day | ORAL | 5 refills | Status: DC

## 2016-09-07 MED ORDER — MECLIZINE HCL 25 MG PO TABS: 25.0000 mg | ORAL_TABLET | Freq: Three times a day (TID) | ORAL | 2 refills | Status: DC | PRN

## 2016-09-07 MED ORDER — GABAPENTIN 300 MG PO CAPS: ORAL_CAPSULE | ORAL | 5 refills | Status: DC

## 2016-09-07 MED ORDER — POTASSIUM CHLORIDE CRYS ER 20 MEQ PO TBCR: EXTENDED_RELEASE_TABLET | ORAL | 5 refills | Status: DC

## 2016-09-07 NOTE — Progress Notes (Signed)
   Subjective:    Patient ID: Pamela Mendez, female    DOB: [birth year], 51 y.o.   MRN: [id number]  HPI This patient was seen today for chronic pain. Takes for back pain  The medication list was reviewed and updated.    -Compliance with medication: YES  - Number patient states they take daily: 2-3  -when was the last dose patient took? today  The patient was advised the importance of maintaining medication and not using illegal substances with these.  Refills needed: yes  The patient was educated that we can provide 3 monthly scripts for their medication, it is their responsibility to follow the instructions.  Side effects or complications from medications: none  Patient is aware that pain medications are meant to minimize the severity of the pain to allow their pain levels to improve to allow for better function. They are aware of that pain medications cannot totally remove their pain.  Due for UDT ( at least once per year) : done aug 2017  Having vertigo. Started 5 days ago.         Review of Systems  Constitutional: Negative for activity change, fatigue and fever.  HENT: Positive for congestion.   Respiratory: Negative for cough and shortness of breath.   Cardiovascular: Negative for chest pain and leg swelling.  Neurological: Positive for dizziness, light-headedness and headaches.  25 minutes was spent with the patient. Greater than half the time was spent in discussion and answering questions and counseling regarding the issues that the patient came in for today.      Objective:   Physical Exam  Constitutional: She appears well-nourished. No distress.  Cardiovascular: Normal rate, regular rhythm and normal heart sounds.   No murmur heard. Pulmonary/Chest: Effort normal and breath sounds normal. No respiratory distress.  Musculoskeletal: She exhibits no edema.  Lymphadenopathy:    She has no cervical adenopathy.  Neurological: She is alert. She exhibits normal  muscle tone.  Psychiatric: Her behavior is normal.  Vitals reviewed.  Pedal edema under good control       Assessment & Plan:  Chronic low back pain and discomfort with sciatica Increase gabapentin 300 mg 3 times a day Effexor causes side effects to stop it let us know Pain prescriptions given patient will uses it responsibly 3 prescriptions given they were dated she will follow-up in approximately 3 months  Hyperlipidemialab lab work ordered previous labs reviewed  Blood pressure good control diuretic use good check metabolic 7  Cholesterol medicine tolerating see above  Reflux under good control taking medication tolerating well  Pedal edema is under good control uses Demadex intermittently.

## 2016-09-07 NOTE — Patient Instructions (Signed)

## 2016-09-07 NOTE — Progress Notes (Signed)
   Subjective:    Patient ID: CARIA WAPLE, female    DOB: 20-Oct-1965, 51 y.o.   MRN: [id number]  HPI This patient was seen today for chronic pain  The medication list was reviewed and updated.   -Compliance with medication: yes  - Number patient states they take daily:   -when was the last dose patient took?   The patient was advised the importance of maintaining medication and not using illegal substances with these.  Refills needed: yes  The patient was educated that we can provide 3 monthly scripts for their medication, it is their responsibility to follow the instructions.  Side effects or complications from medications: none  Patient is aware that pain medications are meant to minimize the severity of the pain to allow their pain levels to improve to allow for better function. They are aware of that pain medications cannot totally remove their pain.  Due for UDT ( at least once per year) :        Review of Systems     Objective:   Physical Exam        Assessment & Plan:

## 2016-10-24 ENCOUNTER — Ambulatory Visit (INDEPENDENT_AMBULATORY_CARE_PROVIDER_SITE_OTHER): Payer: Medicaid Other | Admitting: Obstetrics & Gynecology

## 2016-10-24 ENCOUNTER — Other Ambulatory Visit (HOSPITAL_COMMUNITY)
Admission: RE | Admit: 2016-10-24 | Discharge: 2016-10-24 | Disposition: A | Discharge status: Home / Self Care | Payer: Medicaid Other | Source: Ambulatory Visit | Attending: Obstetrics & Gynecology | Admitting: Obstetrics & Gynecology

## 2016-10-24 ENCOUNTER — Encounter: Payer: Self-pay | Admitting: Obstetrics & Gynecology

## 2016-10-24 VITALS — BP 110/70 | HR 82 | Ht 67.0 in | Wt 217.0 lb

## 2016-10-24 DIAGNOSIS — Z124 Encounter for screening for malignant neoplasm of cervix: Secondary | ICD-10-CM

## 2016-10-24 DIAGNOSIS — Z01419 Encounter for gynecological examination (general) (routine) without abnormal findings: Secondary | ICD-10-CM | POA: Diagnosis present

## 2016-10-24 DIAGNOSIS — N938 Other specified abnormal uterine and vaginal bleeding: Secondary | ICD-10-CM

## 2016-10-24 DIAGNOSIS — Z9889 Other specified postprocedural states: Secondary | ICD-10-CM

## 2016-10-24 DIAGNOSIS — Z1151 Encounter for screening for human papillomavirus (HPV): Secondary | ICD-10-CM | POA: Diagnosis present

## 2016-10-24 NOTE — Progress Notes (Signed)
Chief Complaint  Patient presents with  . having vaginal bleeding    has had an ablation/ room 10    Blood pressure 110/70, pulse 82, height 5\' 7"  (1.702 m), weight 217 lb (98.4 kg).  51 y.o. [id number] No LMP recorded. Patient has had an ablation. The current method of family planning is none.  Outpatient Encounter Prescriptions as of 10/24/2016  Medication Sig  . cetirizine (ZYRTEC) 10 MG tablet TAKE ONE TABLET BY MOUTH AT BEDTIME AS NEEDED FOR ALLERGIES.  . fluticasone (FLONASE) 50 MCG/ACT nasal spray Place 2 sprays into both nostrils daily. (Patient taking differently: Place 2 sprays into both nostrils as needed. )  . gabapentin (NEURONTIN) 300 MG capsule One tid  . HYDROcodone-acetaminophen (NORCO/VICODIN) 5-325 MG tablet One tid prn prn pain  . lisinopril (PRINIVIL,ZESTRIL) 5 MG tablet Take 1 tablet (5 mg total) by mouth daily.  . meclizine (ANTIVERT) 25 MG tablet Take 1 tablet (25 mg total) by mouth 3 (three) times daily as needed for dizziness.  . methocarbamol (ROBAXIN) 500 MG tablet TAKE 1 TABLET BY MOUTH EVERY 6 HOURS AS NEEDED FOR PAIN/MUSCLE SPASMS. (Patient taking differently: Take 500 mg by mouth every 6 (six) hours as needed. FOR PAIN/MUSCLE SPASMS.)  . Olopatadine HCl 0.2 % SOLN Apply 1 drop to eye at bedtime as needed. (Patient taking differently: Apply 1 drop to eye at bedtime as needed. For allergy/dry eye relief)  . pantoprazole (PROTONIX) 40 MG tablet Take 1 tablet (40 mg total) by mouth daily.  . potassium chloride SA (KLOR-CON M20) 20 MEQ tablet Take 2 tablets qam 2 tablets at noon and 2 in the evening.  . simvastatin (ZOCOR) 20 MG tablet Take 1 tablet (20 mg total) by mouth daily.  Marland Kitchen torsemide (DEMADEX) 20 MG tablet TAKE THREE TABLETS BY MOUTH IN THE MORNING AND TWO AT NOON  . [DISCONTINUED] triamcinolone cream (KENALOG) 0.1 % Apply 1 application topically 2 (two) times daily. Prn rash; use up to 2 weeks   No facility-administered encounter medications on file  as of 10/24/2016.     Subjective Pt is 6 years s/p endometrial ablation and has had rare spotting with wiping since, essentially no bleeding, the spotting has been light pink, 2 or 3 times per year at most  3 weeks ago had 2 days of heavy vaginal bleeding like a period with cramps, not severe pain  Objective General WDWN female NAD Vulva:  normal appearing vulva with no masses, tenderness or lesions Vagina:  normal mucosa, no discharge Cervix:  no bleeding following Pap, no cervical motion tenderness and no lesions Uterus:  normal size, contour, position, consistency, mobility, non-tender Adnexa: ovaries:present,  normal adnexa in size, nontender and no masses   Pertinent ROS No burning with urination, frequency or urgency No nausea, vomiting or diarrhea Nor fever chills or other constitutional symptoms   Labs or studies none    Impression Diagnoses this Encounter::   ICD-9-CM ICD-10-CM   1. Other specified abnormal uterine and vaginal bleeding 626.8 N93.8 US Transvaginal Non-OB     US Pelvis Complete  2. Status post endometrial ablation V45.89 Z98.890 US Transvaginal Non-OB     US Pelvis Complete  3. Screening for cervical cancer V76.2 Z12.4 Cytology - PAP    Established relevant diagnosis(es):   Plan/Recommendations: No orders of the defined types were placed in this encounter.   Labs or Scans Ordered: Orders Placed This Encounter  Procedures  . US Transvaginal Non-OB  . US Pelvis Complete  Management:: Doubt hematometra based on clinical hsitory and exm Will obtain sonogram to evaluate the endometrium and uterus specifically  Follow up Return in about 2 weeks (around 11/07/2016) for GYN sono, Follow up, with Dr Despina Hidden.       All questions were answered.  Past Medical History:  Diagnosis Date  . Abdominal pain    Unspecified site  . Anxiety   . Anxiety and depression   . 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 CHF (congestive heart failure)  (HCC) 2004   post-partum  . Chronic back pain   . Chronic left hip pain   . Depression   . Gastritis   . GERD (gastroesophageal reflux disease)   . H/O echocardiogram 2012   EF 55-60%  . Hyperlipidemia   . Hypertension   . Migraine headache without aura   . Vertigo     Past Surgical History:  Procedure Laterality Date  . ABLATION    . Childbirth     time 3  . CHOLECYSTECTOMY  [address] , Dr Katrinka Blazing  . LUMBAR DISC SURGERY  2010   [facility], Dr Marikay Alar  . LUMBAR DISC SURGERY  2005   [place], Dr Marikay Alar    OB History    [facility] Term Preterm AB Living   3 3 3     3    SAB TAB Ectopic Multiple Live Births                  Allergies  Allergen Reactions  . Cefzil [Cefprozil] Anaphylaxis and Swelling  . Sulfa Antibiotics Anaphylaxis and Swelling  . Augmentin [Amoxicillin-Pot Clavulanate] Other (See Comments)    Headache   . Avelox [Moxifloxacin Hcl In Nacl] Other (See Comments)    Joint/muscle pain    Social History   Social History  . Marital status: Single    Spouse name: N/A  . Number of children: N/A  . Years of education: N/A   Social History Main Topics  . Smoking status: Former Smoker    Packs/day: 0.00    Years: 15.00    Types: Cigarettes    Quit date: 08/23/2013  . Smokeless tobacco: Never Used  . Alcohol use No  . Drug use: No  . Sexual activity: Yes    Birth control/ protection: Surgical     Comment: ablation   Other Topics Concern  . None   Social History Narrative   Regular exercise: No    Family History  Problem Relation Age of Onset  . Heart attack Father   . Anesthesia problems Neg Hx   . Hypotension Neg Hx   . Malignant hyperthermia Neg Hx   . Pseudochol deficiency Neg Hx

## 2016-10-26 LAB — CYTOLOGY - PAP
DIAGNOSIS: NEGATIVE
HPV (WINDOPATH): NOT DETECTED

## 2016-11-03 ENCOUNTER — Other Ambulatory Visit: Payer: Self-pay | Admitting: Family Medicine

## 2016-11-03 NOTE — Telephone Encounter (Signed)
Last seen jan 2018

## 2016-11-07 ENCOUNTER — Other Ambulatory Visit: Payer: Medicaid Other

## 2016-11-07 ENCOUNTER — Ambulatory Visit: Payer: Medicaid Other | Admitting: Obstetrics & Gynecology

## 2016-11-21 ENCOUNTER — Encounter: Payer: Self-pay | Admitting: Obstetrics & Gynecology

## 2016-11-21 ENCOUNTER — Ambulatory Visit (INDEPENDENT_AMBULATORY_CARE_PROVIDER_SITE_OTHER): Payer: Medicaid Other | Admitting: Obstetrics & Gynecology

## 2016-11-21 ENCOUNTER — Ambulatory Visit (INDEPENDENT_AMBULATORY_CARE_PROVIDER_SITE_OTHER): Payer: Medicaid Other

## 2016-11-21 VITALS — BP 134/88 | HR 71 | Wt 218.0 lb

## 2016-11-21 DIAGNOSIS — N938 Other specified abnormal uterine and vaginal bleeding: Secondary | ICD-10-CM | POA: Diagnosis not present

## 2016-11-21 DIAGNOSIS — Z9889 Other specified postprocedural states: Secondary | ICD-10-CM

## 2016-11-21 MED ORDER — MEDROXYPROGESTERONE ACETATE 10 MG PO TABS: 10.0000 mg | ORAL_TABLET | Freq: Every day | ORAL | 3 refills | Status: DC

## 2016-11-21 NOTE — Progress Notes (Signed)
PELVIC US TA/TV: heterogeneous anteverted uterus w/mult.fibroids (#1)post right intramural fibroid 2.8 x 2.5 x 2.4 cm,(#2) subserosal fibroid fundal 2.9 x 3.1 x 2.5 cm,heterogeneous endometrium 9.3 mm,normal ovaries bilat,unable to visualize left ovary w/TV,appears to be wnl on TA,mult simple small nabothian cyst,right ovary appears mobile,no free fluid,no pain during ultrasound

## 2016-11-21 NOTE — Progress Notes (Signed)
Follow up appointment for results  Chief Complaint  Patient presents with  . Follow-up    gyn u/s    Blood pressure 134/88, pulse 71, weight 218 lb (98.9 kg).  US Transvaginal Non-ob  Result Date: 11/21/2016 GYNECOLOGIC SONOGRAM Pamela Mendez is a 51 y.o. [id number] LMP 10/05/2016 for a pelvic sonogram for vaginal bleeding s/p endometrial ablation 2012. Uterus                      10.7 x 6.4 x 8 cm, heterogeneous anteverted uterus w/mult.fibroids Endometrium          9.3 mm, symmetrical, heterogeneous,no color flow Right ovary             2.8 x 2.1 x 2.2 cm, wnl Left ovary                3 x 2 x 3.4 cm, wnl, only visualized on TA Fibroids (#1)post right intramural fibroid 2.8 x 2.5 x 2.4 cm,(#2) subserosal fundal fibroid 2.9 x 3.1 x 2.5 cm Technician Comments: PELVIC US TA/TV: heterogeneous anteverted uterus w/mult.fibroids (#1)post right intramural fibroid 2.8 x 2.5 x 2.4 cm,(#2) subserosal fundal fibroid 2.9 x 3.1 x 2.5 cm,heterogeneous endometrium 9.3 mm,normal ovaries bilat,unable to visualize left ovary w/TV,appears to be wnl on TA,mult simple small nabothian cyst,right ovary appears mobile,no free fluid,no pain during ultrasound [employer] 11/21/2016 9:42 AM Clinical Impression and recommendations: I have reviewed the sonogram results above, combined with the patient's current clinical course, below are my impressions and any appropriate recommendations for management based on the sonographic findings. Uterus with 2 small myomas, clinically irrelevant, normal overal lvolume Endometrium with some degree of proliferation, no hematometra, s/p endometrial ablation 6 years ago, no endometrial blood flow Both ovaries are normal, size is not suggestive of menopause This study represents an endometrium that has some ability to respond to estrogenic stimulation even though she is post ablation, will discuss chronic vs cyclic  progestational suppression to protect the endometrium Pamela Mendez H 11/21/2016 10:22  AM   US Pelvis Complete  Result Date: 11/21/2016 GYNECOLOGIC SONOGRAM Pamela Mendez is a 51 y.o. [id number] LMP 10/05/2016 for a pelvic sonogram for vaginal bleeding s/p endometrial ablation 2012. Uterus                      10.7 x 6.4 x 8 cm, heterogeneous anteverted uterus w/mult.fibroids Endometrium          9.3 mm, symmetrical, heterogeneous,no color flow Right ovary             2.8 x 2.1 x 2.2 cm, wnl Left ovary                3 x 2 x 3.4 cm, wnl, only visualized on TA Fibroids (#1)post right intramural fibroid 2.8 x 2.5 x 2.4 cm,(#2) subserosal fundal fibroid 2.9 x 3.1 x 2.5 cm Technician Comments: PELVIC US TA/TV: heterogeneous anteverted uterus w/mult.fibroids (#1)post right intramural fibroid 2.8 x 2.5 x 2.4 cm,(#2) subserosal fundal fibroid 2.9 x 3.1 x 2.5 cm,heterogeneous endometrium 9.3 mm,normal ovaries bilat,unable to visualize left ovary w/TV,appears to be wnl on TA,mult simple small nabothian cyst,right ovary appears mobile,no free fluid,no pain during ultrasound [employer] 11/21/2016 9:42 AM Clinical Impression and recommendations: I have reviewed the sonogram results above, combined with the patient's current clinical course, below are my impressions and any appropriate recommendations for management based on the sonographic findings. Uterus with 2 small myomas, clinically  irrelevant, normal overal lvolume Endometrium with some degree of proliferation, no hematometra, s/p endometrial ablation 6 years ago, no endometrial blood flow Both ovaries are normal, size is not suggestive of menopause This study represents an endometrium that has some ability to respond to estrogenic stimulation even though she is post ablation, will discuss chronic vs cyclic  progestational suppression to protect the endometrium Hodges Treiber H 11/21/2016 10:22 AM      MEDS ordered this encounter: Meds ordered this encounter  Medications  . medroxyPROGESTERone (PROVERA) 10 MG tablet    Sig: Take 1 tablet (10 mg  total) by mouth daily.    Dispense:  30 tablet    Refill:  3    Orders for this encounter: No orders of the defined types were placed in this encounter.   Impression: Other specified abnormal uterine and vaginal bleeding  Status post endometrial ablation    Plan: Peri menopause endometrial stimulation s/p ablation Will cycle with provera for 10 days every 3 months(patient's birthday is on [birth year] so she will do 3/20, 6/20,9/20,12/20 each for 10 days)   Follow up in 1 year  Follow Up: Return in about 1 year (around 11/21/2017) for yearly, with Dr Despina Hidden.       Face to face time:  15 minutes  Greater than 50% of the visit time was spent in counseling and coordination of care with the patient.  The summary and outline of the counseling and care coordination is summarized in the note above.   All questions were answered.  Past Medical History:  Diagnosis Date  . Abdominal pain    Unspecified site  . Anxiety   . Anxiety and depression   . 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 CHF (congestive heart failure) (HCC) 2004   post-partum  . Chronic back pain   . Chronic left hip pain   . Depression   . Gastritis   . GERD (gastroesophageal reflux disease)   . H/O echocardiogram 2012   EF 55-60%  . Hyperlipidemia   . Hypertension   . Migraine headache without aura   . Vertigo     Past Surgical History:  Procedure Laterality Date  . ABLATION    . Childbirth     time 3  . CHOLECYSTECTOMY  [address] , Dr Katrinka Blazing  . LUMBAR DISC SURGERY  2010   [facility], Dr Marikay Alar  . LUMBAR DISC SURGERY  2005   [place], Dr Marikay Alar    OB History    [facility] [place] Term Preterm AB Living   3 3 3     3    SAB TAB Ectopic Multiple Live Births                  Allergies  Allergen Reactions  . Cefzil [Cefprozil] Anaphylaxis and Swelling  . Sulfa Antibiotics Anaphylaxis and Swelling  . Augmentin [Amoxicillin-Pot Clavulanate] Other (See Comments)     Headache   . Avelox [Moxifloxacin Hcl In Nacl] Other (See Comments)    Joint/muscle pain    Social History   Social History  . Marital status: Single    Spouse name: N/A  . Number of children: N/A  . Years of education: N/A   Social History Main Topics  . Smoking status: Former Smoker    Packs/day: 0.00    Years: 15.00    Types: Cigarettes    Quit date: 08/23/2013  . Smokeless tobacco: Never Used  . Alcohol use No  .  Drug use: No  . Sexual activity: Yes    Birth control/ protection: Surgical     Comment: ablation   Other Topics Concern  . None   Social History Narrative   Regular exercise: No    Family History  Problem Relation Age of Onset  . Heart attack Father   . Anesthesia problems Neg Hx   . Hypotension Neg Hx   . Malignant hyperthermia Neg Hx   . Pseudochol deficiency Neg Hx

## 2016-12-06 ENCOUNTER — Ambulatory Visit: Payer: Medicaid Other | Admitting: Family Medicine

## 2016-12-08 ENCOUNTER — Ambulatory Visit (HOSPITAL_COMMUNITY)
Admission: RE | Admit: 2016-12-08 | Discharge: 2016-12-08 | Disposition: A | Discharge status: Home / Self Care | Payer: Medicaid Other | Source: Ambulatory Visit | Attending: Family Medicine | Admitting: Family Medicine

## 2016-12-08 ENCOUNTER — Encounter: Payer: Self-pay | Admitting: Family Medicine

## 2016-12-08 ENCOUNTER — Ambulatory Visit (INDEPENDENT_AMBULATORY_CARE_PROVIDER_SITE_OTHER): Payer: Medicaid Other | Admitting: Family Medicine

## 2016-12-08 VITALS — BP 122/80 | Ht 67.0 in | Wt 210.0 lb

## 2016-12-08 DIAGNOSIS — M79671 Pain in right foot: Secondary | ICD-10-CM | POA: Insufficient documentation

## 2016-12-08 DIAGNOSIS — G8929 Other chronic pain: Secondary | ICD-10-CM

## 2016-12-08 DIAGNOSIS — M21071 Valgus deformity, not elsewhere classified, right ankle: Secondary | ICD-10-CM | POA: Insufficient documentation

## 2016-12-08 DIAGNOSIS — E784 Other hyperlipidemia: Secondary | ICD-10-CM

## 2016-12-08 DIAGNOSIS — M5441 Lumbago with sciatica, right side: Secondary | ICD-10-CM | POA: Diagnosis not present

## 2016-12-08 DIAGNOSIS — I10 Essential (primary) hypertension: Secondary | ICD-10-CM

## 2016-12-08 DIAGNOSIS — M2011 Hallux valgus (acquired), right foot: Secondary | ICD-10-CM | POA: Diagnosis not present

## 2016-12-08 DIAGNOSIS — E7849 Other hyperlipidemia: Secondary | ICD-10-CM

## 2016-12-08 MED ORDER — HYDROCODONE-ACETAMINOPHEN 5-325 MG PO TABS: ORAL_TABLET | ORAL | 0 refills | Status: DC

## 2016-12-08 MED ORDER — NAPROXEN 500 MG PO TABS: 500.0000 mg | ORAL_TABLET | Freq: Two times a day (BID) | ORAL | 2 refills | Status: DC

## 2016-12-08 NOTE — Progress Notes (Signed)
I have spoken with [facility] and they state that the order was placed on 09/07/16, however the patient never had labs drawn. Last BMP drawn was 09/23/15. There are no statements for this patient either.

## 2016-12-08 NOTE — Progress Notes (Signed)
   Subjective:    Patient ID: Pamela Mendez, female    DOB: 08-Nov-1965, 51 y.o.   MRN: [id number]  HPI This patient was seen today for chronic pain  The medication list was reviewed and updated. Takes for low back pain and right leg pain   -Compliance with medication: yes  - Number patient states they take daily: some days one and some days three  -when was the last dose patient took? Half tablet yesterday  The patient was advised the importance of maintaining medication and not using illegal substances with these.  Refills needed: yes  The patient was educated that we can provide 3 monthly scripts for their medication, it is their responsibility to follow the instructions.  Side effects or complications from medications: none  Patient is aware that pain medications are meant to minimize the severity of the pain to allow their pain levels to improve to allow for better function. They are aware of that pain medications cannot totally remove their pain.  Due for UDT ( at least once per year) : last one august 2017  Patient takes blood pressure medicine regular basis Takes her reflux meds and keeps things under control Takes her cholesterol medicine previous labs reviewed with patient Does take her allergy medicines on a regular basis Takes her gabapentin to help with sciatica symptoms and back pain Takes her hydrocodone denies abusing it Relates some mild foot pain present over the past few weeks She is concerned about possibility stress fracture  Does use diuretic for pedal edema intermittent    Review of Systems  Constitutional: Negative for activity change and appetite change.  Gastrointestinal: Negative for abdominal pain and vomiting.  Neurological: Negative for weakness.  Psychiatric/Behavioral: Negative for confusion.       Objective:   Physical Exam  Constitutional: She appears well-nourished. No distress.  HENT:  Head: Normocephalic.  Cardiovascular: Normal  rate, regular rhythm and normal heart sounds.   No murmur heard. Pulmonary/Chest: Effort normal and breath sounds normal.  Musculoskeletal: She exhibits no edema.  Lymphadenopathy:    She has no cervical adenopathy.  Neurological: She is alert.  Psychiatric: Her behavior is normal.  Vitals reviewed. No significant pedal edema Patient has some subjective mild foot pain  25 minutes was spent with the patient. Greater than half the time was spent in discussion and answering questions and counseling regarding the issues that the patient came in for today.      Assessment & Plan:  Chronic pain management 3 prescriptions given Patient to follow-up in 3 months Patient believes she did her lab work but there is no evidence that it was completed. We will connect with lab cortices was drawn Foot pain-x-rays ordered is still ongoing pain in 2 weeks time notify us and we will have to do additional testing Blood pressure good control Hyperlipidemia previous labs in the past reviewed we will try to track down Labs continue current medicine Reflux under good control

## 2016-12-11 ENCOUNTER — Telehealth: Payer: Self-pay | Admitting: Family Medicine

## 2016-12-11 NOTE — Telephone Encounter (Signed)
Prior-Authorization Form Completed. Awaiting Signature from Dr. Lorin Picket to fax.

## 2016-12-12 NOTE — Telephone Encounter (Signed)
Form was signed thank you 

## 2017-01-03 ENCOUNTER — Other Ambulatory Visit: Payer: Self-pay | Admitting: Family Medicine

## 2017-01-10 ENCOUNTER — Telehealth: Payer: Self-pay | Admitting: Family Medicine

## 2017-01-10 NOTE — Telephone Encounter (Signed)
Form Completed. In Dr. Roby Lofts Box for Signature.

## 2017-01-10 NOTE — Telephone Encounter (Signed)
Pt is calling to check on a medication that is requiring a prior authorization. The medication is HYDROcodone-acetaminophen (NORCO/VICODIN) 5-325 MG tablet

## 2017-01-12 NOTE — Telephone Encounter (Signed)
done

## 2017-01-12 NOTE — Telephone Encounter (Signed)
Faxed on 01/12/17 at 830am

## 2017-01-19 ENCOUNTER — Telehealth: Payer: Self-pay | Admitting: *Deleted

## 2017-01-19 NOTE — Telephone Encounter (Signed)
Patient's Hydrocodone was denied by Medicaid-see denial in yellow folder

## 2017-01-21 NOTE — Telephone Encounter (Signed)
This is somewhat perplexing. This patient has been on pain medicine for over 5 years. She is already taking an anti-inflammatory twice a day. She is taking gabapentin several times per day. She is had previous surgery. She is had MRI. She does not have any further studies to be done. She has chronic low back pain radiates down her leg. She qualifies as chronic pain syndrome. Nurse's-I would like to have this decision appeal. I also would like to have information on how that we go about doing that. Is in a letter? An additional form? What was wrong with the previous forms so that this did not get approved? For the benefit of the patient we need additional information-who ever is doing medication approvals please feel free to discuss with me the particulars that I need to know-also in the means time it is important to let the patient know that we're appealing this and looking further into it

## 2017-01-24 NOTE — Telephone Encounter (Signed)
I have resubmitted prior authorization.

## 2017-02-07 ENCOUNTER — Other Ambulatory Visit: Payer: Self-pay | Admitting: Family Medicine

## 2017-02-10 LAB — HEPATIC FUNCTION PANEL
ALBUMIN: 4.2 g/dL (ref 3.5–5.5)
ALK PHOS: 57 IU/L (ref 39–117)
ALT: 15 IU/L (ref 0–32)
AST: 14 IU/L (ref 0–40)
BILIRUBIN TOTAL: 0.3 mg/dL (ref 0.0–1.2)
BILIRUBIN, DIRECT: 0.1 mg/dL (ref 0.00–0.40)
TOTAL PROTEIN: 7.1 g/dL (ref 6.0–8.5)

## 2017-02-10 LAB — BASIC METABOLIC PANEL
BUN / CREAT RATIO: 10 (ref 9–23)
BUN: 11 mg/dL (ref 6–24)
CO2: 24 mmol/L (ref 18–29)
CREATININE: 1.1 mg/dL — AB (ref 0.57–1.00)
Calcium: 9.1 mg/dL (ref 8.7–10.2)
Chloride: 100 mmol/L (ref 96–106)
GFR, EST AFRICAN AMERICAN: 68 mL/min/{1.73_m2} (ref >59)
GFR, EST NON AFRICAN AMERICAN: 59 mL/min/{1.73_m2} — AB (ref >59)
Glucose: 110 mg/dL — ABNORMAL HIGH (ref 65–99)
Potassium: 3.9 mmol/L (ref 3.5–5.2)
SODIUM: 138 mmol/L (ref 134–144)

## 2017-02-10 LAB — LIPID PANEL
CHOL/HDL RATIO: 4.1 ratio (ref 0.0–4.4)
Cholesterol, Total: 191 mg/dL (ref 100–199)
HDL: 47 mg/dL (ref >39)
LDL Calculated: 116 mg/dL — ABNORMAL HIGH (ref 0–99)
Triglycerides: 141 mg/dL (ref 0–149)
VLDL Cholesterol Cal: 28 mg/dL (ref 5–40)

## 2017-02-12 ENCOUNTER — Telehealth: Payer: Self-pay | Admitting: Family Medicine

## 2017-02-12 ENCOUNTER — Other Ambulatory Visit: Payer: Self-pay | Admitting: *Deleted

## 2017-02-12 MED ORDER — PRAVASTATIN SODIUM 40 MG PO TABS: 40.0000 mg | ORAL_TABLET | Freq: Every day | ORAL | 5 refills | Status: DC

## 2017-02-12 NOTE — Telephone Encounter (Signed)
Resubmitted prior auth request for pt's HYDROcodone-acetaminophen (NORCO/VICODIN) 5-325 MG tablet  Sent 02/09/17 with extra clinical along with form previously submitted   Await decision

## 2017-02-12 NOTE — Telephone Encounter (Signed)
Per [facility] @ [address] - this was APPROVED by insurance No approval info rec'd here from insurance, filed all documentation in my file cabinet   Only approved for one month due to the form was completed for "length of therapy" for only 30 days  WIll process new PA when next refill is due

## 2017-03-08 ENCOUNTER — Telehealth: Payer: Self-pay | Admitting: Family Medicine

## 2017-03-08 NOTE — Telephone Encounter (Signed)
Rx prior auth APPROVED for pt's HYDROcodone-acetaminophen (NORCO/VICODIN) 5-325 MG tablet  Valid 03/06/17-09/02/17 through [facility]  PA# [id number]  Sent approval notice to be scanned in to the chart

## 2017-03-09 ENCOUNTER — Encounter: Payer: Self-pay | Admitting: Family Medicine

## 2017-03-09 ENCOUNTER — Ambulatory Visit (INDEPENDENT_AMBULATORY_CARE_PROVIDER_SITE_OTHER): Payer: Medicaid Other | Admitting: Family Medicine

## 2017-03-09 VITALS — BP 120/84 | Ht 67.0 in | Wt 215.2 lb

## 2017-03-09 DIAGNOSIS — E784 Other hyperlipidemia: Secondary | ICD-10-CM

## 2017-03-09 DIAGNOSIS — K219 Gastro-esophageal reflux disease without esophagitis: Secondary | ICD-10-CM | POA: Diagnosis not present

## 2017-03-09 DIAGNOSIS — M5431 Sciatica, right side: Secondary | ICD-10-CM

## 2017-03-09 DIAGNOSIS — E7849 Other hyperlipidemia: Secondary | ICD-10-CM

## 2017-03-09 MED ORDER — METHOCARBAMOL 500 MG PO TABS: ORAL_TABLET | ORAL | 2 refills | Status: DC

## 2017-03-09 MED ORDER — HYDROCODONE-ACETAMINOPHEN 7.5-325 MG PO TABS: 1.0000 | ORAL_TABLET | Freq: Four times a day (QID) | ORAL | 0 refills | Status: DC | PRN

## 2017-03-09 MED ORDER — GABAPENTIN 300 MG PO CAPS: ORAL_CAPSULE | ORAL | 0 refills | Status: DC

## 2017-03-09 NOTE — Progress Notes (Signed)
   Subjective:    Patient ID: Pamela Mendez, female    DOB: December 23, 1965, 51 y.o.   MRN: [id number]  HPI This patient was seen today for chronic pain  The medication list was reviewed and updated.   -Compliance with medication: Takes daily  2005 last back surgery dr Yetta Barre Now with severe sciatica Pain med not helping gbapentin does not cause drowsiness - Number patient states they take daily: Patient states 2-3 per day.   -when was the last dose patient took? Last dose last night   The patient was advised the importance of maintaining medication and not using illegal substances with these.  Refills needed: Patient states needs refills   The patient was educated that we can provide 3 monthly scripts for their medication, it is their responsibility to follow the instructions. Reflux under good control. Takes medicine regular basis Hyperlipidemia previous labs reviewed was switched to pravastatin to get better control of her numbers Side effects or complications from medications: None   Patient is aware that pain medications are meant to minimize the severity of the pain to allow their pain levels to improve to allow for better function. They are aware of that pain medications cannot totally remove their pain.  Due for UDT ( at least once per year) : Due in August 2018   Has concerns of swelling to right foot.  Had back injections about a year ago A little bit of swelling on top of the foot none in the calf pain     Review of Systems  Constitutional: Negative for activity change and appetite change.  Gastrointestinal: Negative for abdominal pain and vomiting.  Musculoskeletal: Positive for back pain.  Neurological: Negative for weakness.  Psychiatric/Behavioral: Negative for confusion.       Objective:   Physical Exam  Constitutional: She appears well-nourished. No distress.  HENT:  Head: Normocephalic.  Cardiovascular: Normal rate, regular rhythm and normal heart  sounds.   No murmur heard. Pulmonary/Chest: Effort normal and breath sounds normal.  Musculoskeletal: She exhibits no edema.  Lymphadenopathy:    She has no cervical adenopathy.  Neurological: She is alert.  Psychiatric: Her behavior is normal.  Vitals reviewed.   25 minutes was spent with the patient. Greater than half the time was spent in discussion and answering questions and counseling regarding the issues that the patient came in for today.  Positive straight leg raise on the left strength overall good     Assessment & Plan:  Severe sciatica Increase gabapentin Change pain medicine Follow-up in 3-4 weeks for recheck If progressive troubles or worse follow-up sooner If medication causes drowsiness stop medicine notify us Reflux continue current measures Blood pressure good control continue current measures Hyperlipidemia previous labs were noted is now on pravastatin  If back does not significantly improved will get her back in with neurosurgery [place]-Dr. Yetta Barre

## 2017-03-09 NOTE — Patient Instructions (Signed)

## 2017-03-15 ENCOUNTER — Other Ambulatory Visit: Payer: Self-pay | Admitting: Family Medicine

## 2017-03-15 NOTE — Telephone Encounter (Signed)
Last 03/09/17 for sciatica

## 2017-03-31 ENCOUNTER — Encounter (HOSPITAL_COMMUNITY): Payer: Self-pay | Admitting: Emergency Medicine

## 2017-03-31 ENCOUNTER — Emergency Department (HOSPITAL_COMMUNITY): Payer: Medicaid Other

## 2017-03-31 ENCOUNTER — Emergency Department (HOSPITAL_COMMUNITY)
Admission: EM | Admit: 2017-03-31 | Discharge: 2017-03-31 | Disposition: A | Discharge status: Home / Self Care | Payer: Medicaid Other | Attending: Emergency Medicine | Admitting: Emergency Medicine

## 2017-03-31 DIAGNOSIS — Z87891 Personal history of nicotine dependence: Secondary | ICD-10-CM | POA: Diagnosis not present

## 2017-03-31 DIAGNOSIS — R51 Headache: Secondary | ICD-10-CM | POA: Diagnosis present

## 2017-03-31 DIAGNOSIS — I110 Hypertensive heart disease with heart failure: Secondary | ICD-10-CM | POA: Diagnosis not present

## 2017-03-31 DIAGNOSIS — I509 Heart failure, unspecified: Secondary | ICD-10-CM | POA: Diagnosis not present

## 2017-03-31 DIAGNOSIS — R519 Headache, unspecified: Secondary | ICD-10-CM

## 2017-03-31 LAB — BASIC METABOLIC PANEL
Anion gap: 10 (ref 5–15)
BUN: 15 mg/dL (ref 6–20)
CHLORIDE: 103 mmol/L (ref 101–111)
CO2: 28 mmol/L (ref 22–32)
Calcium: 8.8 mg/dL — ABNORMAL LOW (ref 8.9–10.3)
Creatinine, Ser: 1.15 mg/dL — ABNORMAL HIGH (ref 0.44–1.00)
GFR calc Af Amer: >60 mL/min (ref >60)
GFR calc non Af Amer: 55 mL/min — ABNORMAL LOW (ref >60)
GLUCOSE: 106 mg/dL — AB (ref 65–99)
POTASSIUM: 3.8 mmol/L (ref 3.5–5.1)
Sodium: 141 mmol/L (ref 135–145)

## 2017-03-31 LAB — CBC WITH DIFFERENTIAL/PLATELET
Basophils Absolute: 0.1 10*3/uL (ref 0.0–0.1)
Basophils Relative: 1 %
EOS PCT: 3 %
Eosinophils Absolute: 0.4 10*3/uL (ref 0.0–0.7)
HCT: 45.5 % (ref 36.0–46.0)
HEMOGLOBIN: 14.8 g/dL (ref 12.0–15.0)
LYMPHS ABS: 3.1 10*3/uL (ref 0.7–4.0)
LYMPHS PCT: 27 %
MCH: 29.3 pg (ref 26.0–34.0)
MCHC: 32.5 g/dL (ref 30.0–36.0)
MCV: 90.1 fL (ref 78.0–100.0)
MONOS PCT: 10 %
Monocytes Absolute: 1.2 10*3/uL — ABNORMAL HIGH (ref 0.1–1.0)
Neutro Abs: 6.5 10*3/uL (ref 1.7–7.7)
Neutrophils Relative %: 59 %
PLATELETS: 232 10*3/uL (ref 150–400)
RBC: 5.05 MIL/uL (ref 3.87–5.11)
RDW: 13.2 % (ref 11.5–15.5)
WBC: 11.2 10*3/uL — ABNORMAL HIGH (ref 4.0–10.5)

## 2017-03-31 MED ORDER — METOCLOPRAMIDE HCL 5 MG/ML IJ SOLN
10.0000 mg | Freq: Once | INTRAMUSCULAR | Status: AC
  Administered 2017-03-31: 10 mg via INTRAVENOUS
  Filled 2017-03-31: qty 2

## 2017-03-31 MED ORDER — KETOROLAC TROMETHAMINE 30 MG/ML IJ SOLN
30.0000 mg | Freq: Once | INTRAMUSCULAR | Status: AC
  Administered 2017-03-31: 30 mg via INTRAVENOUS
  Filled 2017-03-31: qty 1

## 2017-03-31 MED ORDER — HYDROCODONE-ACETAMINOPHEN 5-325 MG PO TABS: 1.0000 | ORAL_TABLET | Freq: Four times a day (QID) | ORAL | 0 refills | Status: DC | PRN

## 2017-03-31 MED ORDER — DIPHENHYDRAMINE HCL 50 MG/ML IJ SOLN
25.0000 mg | Freq: Once | INTRAMUSCULAR | Status: AC
  Administered 2017-03-31: 25 mg via INTRAVENOUS
  Filled 2017-03-31: qty 1

## 2017-03-31 NOTE — ED Triage Notes (Signed)
Pt reports headache with blurred vision and photophobia since yesterday.  Vomited x 2 and states she urinated while vomiting and this concerned her.

## 2017-03-31 NOTE — ED Notes (Signed)
Patient transported to CT 

## 2017-03-31 NOTE — ED Provider Notes (Signed)
[facility] Provider Note   CSN: [id number] Arrival date & time: 03/31/17  1707     History   Chief Complaint Chief Complaint  Patient presents with  . Headache    HPI Pamela Mendez is a 51 y.o. female.   Patient complains of severe headache. She also has nausea. Patient is a history of migraines but it's been a long time since she's had a headache   The history is provided by the patient. No language interpreter was used.  Headache   This is a recurrent problem. The current episode started 12 to 24 hours ago. The problem occurs constantly. The problem has not changed since onset.The headache is associated with nothing. The pain is located in the bilateral region. The quality of the pain is described as dull. The pain is at a severity of 8/10. The pain is moderate. The pain does not radiate.    Past Medical History:  Diagnosis Date  . Abdominal pain    Unspecified site  . Anxiety   . Anxiety and depression   . 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 CHF (congestive heart failure) (HCC) 2004   post-partum  . Chronic back pain   . Chronic left hip pain   . Depression   . Gastritis   . GERD (gastroesophageal reflux disease)   . H/O echocardiogram 2012   EF 55-60%  . Hyperlipidemia   . Hypertension   . Migraine headache without aura   . Vertigo     Patient Active Problem List   Diagnosis Date Noted  . HTN (hypertension) 12/08/2016  . Sinusitis 06/27/2016  . Sciatica of right side 01/04/2016  . Hypokalemia 10/07/2015  . Migraine headache without aura 03/31/2015  . GERD (gastroesophageal reflux disease) 02/04/2015  . Chronic back pain 05/13/2013  . Allergic rhinitis 12/31/2012  . EDEMA 11/09/2010  . Hyperlipidemia 01/17/2010  . DEPRESSION/ANXIETY 01/17/2010  . CARDIOMYOPATHY, MILD 01/17/2010  . ABDOMINAL PAIN, UNSPECIFIED SITE 01/17/2010    Past Surgical History:  Procedure Laterality Date  . ABLATION    . Childbirth     time 3  .  CHOLECYSTECTOMY  [address] , Dr Katrinka Blazing  . LUMBAR DISC SURGERY  2010   [facility], Dr Marikay Alar  . LUMBAR DISC SURGERY  2005   [facility], Dr Marikay Alar    OB History    [facility]   3 3 3     3    SAB TAB Ectopic Multiple Live Births                   Home Medications    Prior to Admission medications   Medication Sig Start Date End Date Taking? Authorizing Provider  cetirizine (ZYRTEC) 10 MG tablet TAKE ONE TABLET BY MOUTH AT BEDTIME AS NEEDED FOR ALLERGIES. 09/07/16  Yes Luking, Jonna Coup, MD  fluticasone (FLONASE) 50 MCG/ACT nasal spray Place 2 sprays into both nostrils daily. Patient taking differently: Place 2 sprays into both nostrils as needed.  09/07/16  Yes Babs Sciara, MD  gabapentin (NEURONTIN) 300 MG capsule Take 2 tablets by mouth every morning, Take 1 tablet in the afternoon, then take 2 in the evening. 03/09/17  Yes Babs Sciara, MD  lisinopril (PRINIVIL,ZESTRIL) 5 MG tablet Take 1 tablet (5 mg total) by mouth daily. 09/07/16  Yes Luking, Jonna Coup, MD  methocarbamol (ROBAXIN) 500 MG tablet TAKE 1 TABLET BY MOUTH EVERY 6 HOURS AS  NEEDED FOR PAIN/MUSCLE SPASMS. 03/09/17  Yes Babs Sciara, MD  Multiple Vitamin (MULTIVITAMIN WITH MINERALS) TABS tablet Take 1 tablet by mouth daily.   Yes [provider]  naproxen (NAPROSYN) 500 MG tablet TAKE (1) TABLET BY MOUTH TWICE DAILY WITH A MEAL. 03/15/17  Yes Luking, Scott A, MD  Olopatadine HCl 0.2 % SOLN Apply 1 drop to eye at bedtime as needed. Patient taking differently: Apply 1 drop to eye at bedtime as needed. For allergy/dry eye relief 01/04/16  Yes Luking, Scott A, MD  pantoprazole (PROTONIX) 40 MG tablet Take 1 tablet (40 mg total) by mouth daily. 09/07/16  Yes Luking, Jonna Coup, MD  potassium chloride SA (KLOR-CON M20) 20 MEQ tablet Take 2 tablets qam 2 tablets at noon and 2 in the evening. 09/07/16  Yes Luking, Jonna Coup, MD  torsemide (DEMADEX) 20 MG tablet TAKE THREE TABLETS BY MOUTH IN THE  MORNING AND TWO AT NOON 09/07/16  Yes Luking, Jonna Coup, MD  HYDROcodone-acetaminophen (NORCO/VICODIN) 5-325 MG tablet Take 1 tablet by mouth every 6 (six) hours as needed for moderate pain. 03/31/17   Bethann Berkshire, MD  pravastatin (PRAVACHOL) 40 MG tablet Take 1 tablet (40 mg total) by mouth daily. 02/12/17   Babs Sciara, MD    Family History Family History  Problem Relation Age of Onset  . Heart attack Father   . Anesthesia problems Neg Hx   . Hypotension Neg Hx   . Malignant hyperthermia Neg Hx   . Pseudochol deficiency Neg Hx     Social History Social History  Substance Use Topics  . Smoking status: Former Smoker    Packs/day: 0.00    Years: 15.00    Types: Cigarettes    Quit date: 08/23/2013  . Smokeless tobacco: Never Used  . Alcohol use No     Allergies   Cefzil [cefprozil]; Sulfa antibiotics; Augmentin [amoxicillin-pot clavulanate]; and Avelox [moxifloxacin hcl in nacl]   Review of Systems Review of Systems  Constitutional: Negative for appetite change and fatigue.  HENT: Negative for congestion, ear discharge and sinus pressure.   Eyes: Negative for discharge.  Respiratory: Negative for cough.   Cardiovascular: Negative for chest pain.  Gastrointestinal: Negative for abdominal pain and diarrhea.  Genitourinary: Negative for frequency and hematuria.  Musculoskeletal: Negative for back pain.  Skin: Negative for rash.  Neurological: Positive for headaches. Negative for seizures.  Psychiatric/Behavioral: Negative for hallucinations.     Physical Exam Updated Vital Signs BP (!) 112/59 (BP Location: Right Arm)   Pulse 78   Temp 98.1 F (36.7 C) (Oral)   Resp 18   Ht 5\' 9"  (1.753 m)   Wt 97.5 kg (215 lb)   SpO2 95%   BMI 31.75 kg/m   Physical Exam  Constitutional: She is oriented to person, place, and time. She appears well-developed.  HENT:  Head: Normocephalic.  Eyes: Conjunctivae and EOM are normal. No scleral icterus.  Neck: Neck supple. No  thyromegaly present.  Cardiovascular: Normal rate and regular rhythm.  Exam reveals no gallop and no friction rub.   No murmur heard. Pulmonary/Chest: No stridor. She has no wheezes. She has no rales. She exhibits no tenderness.  Abdominal: She exhibits no distension. There is no tenderness. There is no rebound.  Musculoskeletal: Normal range of motion. She exhibits no edema.  Lymphadenopathy:    She has no cervical adenopathy.  Neurological: She is oriented to person, place, and time. She exhibits normal muscle tone. Coordination normal.  Skin: No  rash noted. No erythema.  Psychiatric: She has a normal mood and affect. Her behavior is normal.     ED Treatments / Results  Labs (all labs ordered are listed, but only abnormal results are displayed) Labs Reviewed  CBC WITH DIFFERENTIAL/PLATELET - Abnormal; Notable for the following:       Result Value   WBC 11.2 (*)    Monocytes Absolute 1.2 (*)    All other components within normal limits  BASIC METABOLIC PANEL - Abnormal; Notable for the following:    Glucose, Bld 106 (*)    Creatinine, Ser 1.15 (*)    Calcium 8.8 (*)    GFR calc non Af Amer 55 (*)    All other components within normal limits    EKG  EKG Interpretation None       Radiology Ct Head Wo Contrast  Result Date: 03/31/2017 CLINICAL DATA:  Headache and blurred vision. EXAM: CT HEAD WITHOUT CONTRAST TECHNIQUE: Contiguous axial images were obtained from the base of the skull through the vertex without intravenous contrast. COMPARISON:  None. FINDINGS: Brain: No evidence of acute infarction, hemorrhage, hydrocephalus, extra-axial collection or mass lesion/mass effect. Mildly asymmetric areas of hypoattenuation are seen in the periventricular white matter surrounding the posterior horns of the lateral ventricles. Vascular: No hyperdense vessel or unexpected calcification. Skull: Normal. Negative for fracture or focal lesion. Sinuses/Orbits: No acute finding. Other: None.  IMPRESSION: No evidence of acute cortical infarction or hemorrhage. Mildly asymmetric areas of hypoattenuation in the periventricular white matter surrounding the posterior horns of the lateral ventricles. These may be seen with asymmetric mild microangiopathy, or other diseases or prosthesis involving the white matter, including migraine headaches. Electronically Signed   By: Ted Mcalpine M.D.   On: 03/31/2017 20:45    Procedures Procedures (including critical care time)  Medications Ordered in ED Medications  ketorolac (TORADOL) 30 MG/ML injection 30 mg (30 mg Intravenous Given 03/31/17 2029)  metoCLOPramide (REGLAN) injection 10 mg (10 mg Intravenous Given 03/31/17 2028)  diphenhydrAMINE (BENADRYL) injection 25 mg (25 mg Intravenous Given 03/31/17 2028)     Initial Impression / Assessment and Plan / ED Course  I have reviewed the triage vital signs and the nursing notes.  Pertinent labs & imaging results that were available during my care of the patient were reviewed by me and considered in my medical decision making (see chart for details).     Labs and CT scan unremarkable. Patient improved with migraine cocktail. Patient will be discharged home with some Vicodin will follow-up with her PCP  Final Clinical Impressions(s) / ED Diagnoses   Final diagnoses:  Bad headache    New Prescriptions New Prescriptions   HYDROCODONE-ACETAMINOPHEN (NORCO/VICODIN) 5-325 MG TABLET    Take 1 tablet by mouth every 6 (six) hours as needed for moderate pain.     Bethann Berkshire, MD 03/31/17 2155

## 2017-03-31 NOTE — ED Notes (Signed)
Pt states understanding of care given and follow up instructions.  Informed pt no drinking ETOH or driving a vehicle while taking prescription medications.  Informed pt to return immediately if she has sudden severe headache different from current symptoms, slurred speech, or difficulty ambulating.  Pt a/o ambulated from ED with steady gait.

## 2017-03-31 NOTE — Discharge Instructions (Signed)
Follow up with your md this week for recheck  °

## 2017-04-02 ENCOUNTER — Ambulatory Visit (INDEPENDENT_AMBULATORY_CARE_PROVIDER_SITE_OTHER): Payer: Medicaid Other | Admitting: Nurse Practitioner

## 2017-04-02 ENCOUNTER — Encounter: Payer: Self-pay | Admitting: Nurse Practitioner

## 2017-04-02 VITALS — BP 132/90 | Ht 66.0 in | Wt 211.0 lb

## 2017-04-02 DIAGNOSIS — K047 Periapical abscess without sinus: Secondary | ICD-10-CM

## 2017-04-02 DIAGNOSIS — I889 Nonspecific lymphadenitis, unspecified: Secondary | ICD-10-CM | POA: Diagnosis not present

## 2017-04-02 DIAGNOSIS — M2669 Other specified disorders of temporomandibular joint: Secondary | ICD-10-CM

## 2017-04-02 MED ORDER — METRONIDAZOLE 500 MG PO TABS: 500.0000 mg | ORAL_TABLET | Freq: Three times a day (TID) | ORAL | 0 refills | Status: DC

## 2017-04-02 MED ORDER — PENICILLIN V POTASSIUM 500 MG PO TABS: 500.0000 mg | ORAL_TABLET | Freq: Four times a day (QID) | ORAL | 0 refills | Status: DC

## 2017-04-02 NOTE — Patient Instructions (Signed)
Temporomandibular Joint Syndrome Temporomandibular joint (TMJ) syndrome is a condition that affects the joints between your jaw and your skull. The TMJs are located near your ears and allow your jaw to open and close. These joints and the nearby muscles are involved in all movements of the jaw. People with TMJ syndrome have pain in the area of these joints and muscles. Chewing, biting, or other movements of the jaw can be difficult or painful. TMJ syndrome can be caused by various things. In many cases, the condition is mild and goes away within a few weeks. For some people, the condition can become a long-term problem. What are the causes? Possible causes of TMJ syndrome include:  Grinding your teeth or clenching your jaw. Some people do this when they are under stress.  Arthritis.  Injury to the jaw.  Head or neck injury.  Teeth or dentures that are not aligned well.  In some cases, the cause of TMJ syndrome may not be known. What are the signs or symptoms? The most common symptom is an aching pain on the side of the head in the area of the TMJ. Other symptoms may include:  Pain when moving your jaw, such as when chewing or biting.  Being unable to open your jaw all the way.  Making a clicking sound when you open your mouth.  Headache.  Earache.  Neck or shoulder pain.  How is this diagnosed? Diagnosis can usually be made based on your symptoms, your medical history, and a physical exam. Your health care provider may check the range of motion of your jaw. Imaging tests, such as X-rays or an MRI, are sometimes done. You may need to see your dentist to determine if your teeth and jaw are lined up correctly. How is this treated? TMJ syndrome often goes away on its own. If treatment is needed, the options may include:  Eating soft foods and applying ice or heat.  Medicines to relieve pain or inflammation.  Medicines to relax the muscles.  A splint, bite plate, or mouthpiece  to prevent teeth grinding or jaw clenching.  Relaxation techniques or counseling to help reduce stress.  Transcutaneous electrical nerve stimulation (TENS). This helps to relieve pain by applying an electrical current through the skin.  Acupuncture. This is sometimes helpful to relieve pain.  Jaw surgery. This is rarely needed.  Follow these instructions at home:  Take medicines only as directed by your health care provider.  Eat a soft diet if you are having trouble chewing.  Apply ice to the painful area. ? Put ice in a plastic bag. ? Place a towel between your skin and the bag. ? Leave the ice on for 20 minutes, 2-3 times a day.  Apply a warm compress to the painful area as directed.  Massage your jaw area and perform any jaw stretching exercises as recommended by your health care provider.  If you were given a mouthpiece or bite plate, wear it as directed.  Avoid foods that require a lot of chewing. Do not chew gum.  Keep all follow-up visits as directed by your health care provider. This is important. Contact a health care provider if:  You are having trouble eating.  You have new or worsening symptoms. Get help right away if:  Your jaw locks open or closed. This information is not intended to replace advice given to you by your health care provider. Make sure you discuss any questions you have with your health care provider. Document   Released: 05/16/2001 Document Revised: 04/20/2016 Document Reviewed: 03/26/2014 Elsevier Interactive Patient Education  2018 [employer]  

## 2017-04-02 NOTE — Progress Notes (Signed)
Subjective:  Presents for complaints of bilateral jaw pain more on the right that began 2 days ago. Was seen at ED for a headache on 7/28. No fever. No difficulty swallowing. Difficulty opening and closing the mouth. No sore throat. Bilateral ear pain. Had some vomiting with her headache which is greatly improved. Headache is much improved. No wheezing. Has some tenderness around the lower jaw at the site of the tooth that had been removed years ago.  Objective:   Ht 5\' 6"  (1.676 m)   Wt 211 lb 0.6 oz (95.7 kg)   BMI 34.06 kg/m  NAD. Alert, oriented. TMs mild clear effusion, no erythema. Pharynx clear moist. Area on the right lower gum at the site of a tooth removal has signs of decay and extremely tender to palpation. Tenderness at the TMJ area bilateral more so on the right can open and close the mouth but not fully. Tenderness all along the anterior cervical area again more on the right. Distinct tenderness along the lower face near the site of her tooth infection. Lymph nodes are moderate in size and very tender.  Assessment:  Abscessed tooth  TMJ inflammation  Lymphadenitis    Plan:   Meds ordered this encounter  Medications  . penicillin v potassium (VEETID) 500 MG tablet    Sig: Take 1 tablet (500 mg total) by mouth 4 (four) times daily.    Dispense:  28 tablet    Refill:  0    Has taken Amoxil without difficulty    Order Specific Question:   Supervising Provider    Answer:   Merlyn Albert [[id number]]  . metroNIDAZOLE (FLAGYL) 500 MG tablet    Sig: Take 1 tablet (500 mg total) by mouth 3 (three) times daily.    Dispense:  21 tablet    Refill:  0    Order Specific Question:   Supervising Provider    Answer:   Merlyn Albert [[id number]]   Restart Robaxin as directed. Given information on TMJ inflammation and tightness. Warning signs reviewed. Call back by the end of the week if no improvement, sooner if worse. Also recommend recheck with her dentist.

## 2017-04-06 ENCOUNTER — Encounter: Payer: Self-pay | Admitting: Family Medicine

## 2017-04-06 ENCOUNTER — Ambulatory Visit (INDEPENDENT_AMBULATORY_CARE_PROVIDER_SITE_OTHER): Payer: Medicaid Other | Admitting: Family Medicine

## 2017-04-06 VITALS — BP 118/82 | Ht 66.0 in | Wt 207.0 lb

## 2017-04-06 DIAGNOSIS — Z79891 Long term (current) use of opiate analgesic: Secondary | ICD-10-CM

## 2017-04-06 DIAGNOSIS — G8929 Other chronic pain: Secondary | ICD-10-CM | POA: Diagnosis not present

## 2017-04-06 DIAGNOSIS — M5441 Lumbago with sciatica, right side: Secondary | ICD-10-CM

## 2017-04-06 MED ORDER — HYDROCODONE-ACETAMINOPHEN 7.5-325 MG PO TABS: 1.0000 | ORAL_TABLET | ORAL | 0 refills | Status: DC | PRN

## 2017-04-06 MED ORDER — TORSEMIDE 20 MG PO TABS: ORAL_TABLET | ORAL | 5 refills | Status: DC

## 2017-04-06 MED ORDER — GABAPENTIN 300 MG PO CAPS: ORAL_CAPSULE | ORAL | 0 refills | Status: DC

## 2017-04-06 MED ORDER — LISINOPRIL 5 MG PO TABS: 5.0000 mg | ORAL_TABLET | Freq: Every day | ORAL | 5 refills | Status: DC

## 2017-04-06 MED ORDER — PANTOPRAZOLE SODIUM 40 MG PO TBEC: 40.0000 mg | DELAYED_RELEASE_TABLET | Freq: Every day | ORAL | 5 refills | Status: DC

## 2017-04-06 MED ORDER — POTASSIUM CHLORIDE CRYS ER 20 MEQ PO TBCR: EXTENDED_RELEASE_TABLET | ORAL | 5 refills | Status: DC

## 2017-04-06 NOTE — Progress Notes (Signed)
   Subjective:    Patient ID: Pamela Mendez, female    DOB: May 24, 1966, 51 y.o.   MRN: [id number]  HPI This patient was seen today for chronic pain  The medication list was reviewed and updated.   -Compliance with medication: yes  - Number patient states they take daily: tries not to take more than 3 a day.   -when was the last dose patient took? yesterday  The patient was advised the importance of maintaining medication and not using illegal substances with these.  Refills needed: yes  The patient was educated that we can provide 3 monthly scripts for their medication, it is their responsibility to follow the instructions.  Side effects or complications from medications: none  Patient is aware that pain medications are meant to minimize the severity of the pain to allow their pain levels to improve to allow for better function. They are aware of that pain medications cannot totally remove their pain.  Due for UDT ( at least once per year) : due today  Patient states a new dose and medicine doing a good job of keeping anxiety under better control. Patient functions better with pain medicine. Gabapentin being tolerated well denies drowsiness.      Review of Systems  Constitutional: Negative for activity change, appetite change and fatigue.  HENT: Negative for congestion, ear discharge and rhinorrhea.   Eyes: Negative for discharge.  Respiratory: Negative for cough, chest tightness and wheezing.   Cardiovascular: Negative for chest pain.  Gastrointestinal: Negative for abdominal pain and vomiting.  Genitourinary: Negative for difficulty urinating and frequency.  Musculoskeletal: Negative for neck pain.  Allergic/Immunologic: Negative for environmental allergies and food allergies.  Neurological: Negative for weakness and headaches.  Psychiatric/Behavioral: Negative for agitation and behavioral problems.       Objective:   Physical Exam  Constitutional: She is oriented to  person, place, and time. She appears well-developed and well-nourished.  HENT:  Head: Normocephalic.  Right Ear: External ear normal.  Left Ear: External ear normal.  Eyes: Pupils are equal, round, and reactive to light.  Neck: Normal range of motion. No thyromegaly present.  Cardiovascular: Normal rate, regular rhythm, normal heart sounds and intact distal pulses.   No murmur heard. Pulmonary/Chest: Effort normal and breath sounds normal. No respiratory distress. She has no wheezes.  Abdominal: Soft. Bowel sounds are normal. She exhibits no distension and no mass. There is no tenderness.  Musculoskeletal: Normal range of motion. She exhibits no edema or tenderness.  Lymphadenopathy:    She has no cervical adenopathy.  Neurological: She is alert and oriented to person, place, and time. She exhibits normal muscle tone.  Skin: Skin is warm and dry.  Psychiatric: She has a normal mood and affect. Her behavior is normal.          Assessment & Plan:  The patient was seen today as part of a comprehensive visit regarding pain control. Patient's compliance with the medication as well as discussion regarding effectiveness was completed. Prescriptions were written. Patient was advised to follow-up in 3 months. The patient was assessed for any signs of severe side effects. The patient was advised to take the medicine as directed and to report to [place] if any side effect issues.  Urine drugs Green obtained today Pain contract reviewed with patient 3 prescriptions written 1 prescription given today

## 2017-04-06 NOTE — Patient Instructions (Signed)

## 2017-04-07 ENCOUNTER — Other Ambulatory Visit: Payer: Self-pay | Admitting: Family Medicine

## 2017-04-12 LAB — TOXASSURE SELECT 13 (MW), URINE

## 2017-04-27 ENCOUNTER — Encounter: Payer: Self-pay | Admitting: Family Medicine

## 2017-04-27 ENCOUNTER — Ambulatory Visit (INDEPENDENT_AMBULATORY_CARE_PROVIDER_SITE_OTHER): Payer: Medicaid Other | Admitting: Family Medicine

## 2017-04-27 VITALS — BP 124/86 | Ht 66.0 in | Wt 214.0 lb

## 2017-04-27 DIAGNOSIS — K047 Periapical abscess without sinus: Secondary | ICD-10-CM

## 2017-04-27 MED ORDER — PENICILLIN V POTASSIUM 500 MG PO TABS: 500.0000 mg | ORAL_TABLET | Freq: Four times a day (QID) | ORAL | 0 refills | Status: DC

## 2017-04-27 MED ORDER — OXYCODONE-ACETAMINOPHEN 5-325 MG PO TABS: 1.0000 | ORAL_TABLET | Freq: Four times a day (QID) | ORAL | 0 refills | Status: DC | PRN

## 2017-04-27 NOTE — Progress Notes (Signed)
   Subjective:    Patient ID: Pamela Mendez, female    DOB: 1965/10/29, 51 y.o.   MRN: [id number]  HPI Patient is here today for a Right side tooth abscess. Had a tooth pulled fifteen years ago and now looks like the bone is growing back in that spot. Has a dentist,but has been unable to follow up with him. She has an area and right lower jaw swollen tender painful. No other particular troubles. Symptoms over the past several days. Review of Systems Denies throat symptoms or difficulty breathing denies chest tightness pressure pain denies fever chills sweats    Objective:   Physical Exam Lungs clear heart regular neck no masses has swollen right jaw region       Assessment & Plan:  Tooth abscess cellulitis penicillin 10 days Percocet for severe pain do not take hydrocodone with it follow-up if progressive troubles Go see DDS

## 2017-05-09 ENCOUNTER — Other Ambulatory Visit: Payer: Self-pay | Admitting: Family Medicine

## 2017-05-22 ENCOUNTER — Ambulatory Visit (INDEPENDENT_AMBULATORY_CARE_PROVIDER_SITE_OTHER): Payer: Medicaid Other | Admitting: Family Medicine

## 2017-05-22 VITALS — Temp 98.4°F | Ht 66.0 in

## 2017-05-22 DIAGNOSIS — J029 Acute pharyngitis, unspecified: Secondary | ICD-10-CM

## 2017-05-22 DIAGNOSIS — J019 Acute sinusitis, unspecified: Secondary | ICD-10-CM | POA: Diagnosis not present

## 2017-05-22 LAB — POCT RAPID STREP A (OFFICE): Rapid Strep A Screen: NEGATIVE

## 2017-05-22 MED ORDER — AZITHROMYCIN 250 MG PO TABS: ORAL_TABLET | ORAL | 0 refills | Status: DC

## 2017-05-22 NOTE — Progress Notes (Signed)
   Subjective:    Patient ID: Pamela Mendez, female    DOB: 22-Nov-1965, 51 y.o.   MRN: [id number]  Sore Throat   Associated symptoms include congestion and coughing. Pertinent negatives include no ear pain or shortness of breath.  Patient states she woke up this am with sore throat and sinus drainage. She has taken tylenol sinus, which has been of not benefit.She states she must feel better before Saturday as this will be her wedding day.  Denies high fever chills relates sinus pressure pain discomfort drainage denies wheezing difficulty breathing PMH benign Review of Systems  Constitutional: Negative for activity change and fever.  HENT: Positive for congestion and rhinorrhea. Negative for ear pain.   Eyes: Negative for discharge.  Respiratory: Positive for cough. Negative for shortness of breath and wheezing.   Cardiovascular: Negative for chest pain.       Objective:   Physical Exam  Constitutional: She appears well-developed.  HENT:  Head: Normocephalic.  Right Ear: External ear normal.  Left Ear: External ear normal.  Nose: Nose normal.  Mouth/Throat: Oropharynx is clear and moist. No oropharyngeal exudate.  Eyes: Right eye exhibits no discharge. Left eye exhibits no discharge.  Neck: Neck supple. No tracheal deviation present.  Cardiovascular: Normal rate and normal heart sounds.   No murmur heard. Pulmonary/Chest: Effort normal and breath sounds normal. She has no wheezes. She has no rales.  Lymphadenopathy:    She has no cervical adenopathy.  Skin: Skin is warm and dry.  Nursing note and vitals reviewed.    Results for orders placed or performed in visit on 05/22/17  POCT rapid strep A  Result Value Ref Range   Rapid Strep A Screen Negative Negative        Assessment & Plan:  Viral syndrome Secondary rhinosinusitis Antibiotics prescribed warning signs

## 2017-05-28 ENCOUNTER — Telehealth: Payer: Self-pay | Admitting: Family Medicine

## 2017-05-28 MED ORDER — DOXYCYCLINE HYCLATE 100 MG PO TABS: 100.0000 mg | ORAL_TABLET | Freq: Two times a day (BID) | ORAL | 0 refills | Status: DC

## 2017-05-28 NOTE — Telephone Encounter (Signed)
Patient seen Dr. Lorin Picket on 05/22/17 for acute rhinosinusitis.  She completed her z-pak, but is not feeling any better. She is requesting another antibiotic to be called in.  [facility]

## 2017-05-28 NOTE — Telephone Encounter (Signed)
Prescription sent electronically to pharmacy. Patient notified. 

## 2017-05-28 NOTE — Telephone Encounter (Signed)
Nurse to call-I recommend doxycycline 100 mg 1 twice a day for the next 10 days take with the snack and a glass of water follow-up if ongoing troubles

## 2017-05-28 NOTE — Telephone Encounter (Signed)
Patient still having sinus symptoms

## 2017-06-08 ENCOUNTER — Other Ambulatory Visit: Payer: Self-pay | Admitting: Family Medicine

## 2017-06-12 ENCOUNTER — Encounter: Payer: Self-pay | Admitting: Family Medicine

## 2017-06-12 ENCOUNTER — Ambulatory Visit (INDEPENDENT_AMBULATORY_CARE_PROVIDER_SITE_OTHER): Payer: Medicaid Other | Admitting: Family Medicine

## 2017-06-12 VITALS — BP 124/86 | Temp 98.2°F | Ht 66.0 in | Wt 220.0 lb

## 2017-06-12 DIAGNOSIS — J209 Acute bronchitis, unspecified: Secondary | ICD-10-CM | POA: Diagnosis not present

## 2017-06-12 DIAGNOSIS — J019 Acute sinusitis, unspecified: Secondary | ICD-10-CM | POA: Diagnosis not present

## 2017-06-12 MED ORDER — LEVOFLOXACIN 500 MG PO TABS: 500.0000 mg | ORAL_TABLET | Freq: Every day | ORAL | 0 refills | Status: DC

## 2017-06-12 MED ORDER — ALBUTEROL SULFATE HFA 108 (90 BASE) MCG/ACT IN AERS: 2.0000 | INHALATION_SPRAY | Freq: Four times a day (QID) | RESPIRATORY_TRACT | 2 refills | Status: DC | PRN

## 2017-06-12 MED ORDER — FLUTICASONE PROPIONATE 50 MCG/ACT NA SUSP: 2.0000 | Freq: Every day | NASAL | 5 refills | Status: DC

## 2017-06-12 MED ORDER — CETIRIZINE HCL 10 MG PO TABS: ORAL_TABLET | ORAL | 5 refills | Status: DC

## 2017-06-12 NOTE — Progress Notes (Signed)
   Subjective:    Patient ID: Pamela Mendez, female    DOB: Jul 11, 1966, 51 y.o.   MRN: [id number]  Sinusitis  This is a new problem. Episode onset: 3.5 weeks ago. Associated symptoms include coughing and headaches. (Fever, wheezing) Treatments tried: zpack, doxy.   Patient was recently treated for sinus infection and she was treated with Zithromax and then doxycycline still having ongoing head congestion chest congestion denies high fever chills sweats denies nausea vomiting diarrhea feels a little tight in her breathing when she lays down at night otherwise okay   Review of Systems  Respiratory: Positive for cough.   Neurological: Positive for headaches.       Objective:   Physical Exam Eardrums normal mild sinus tenderness throat normal neck supple lungs clear Respiratory rate is normal heart regular      Assessment & Plan:  Viral syndrome Secondary rhinosinusitis I do not hear any pneumonia with her I would recommend Levaquin daily for 10 days she has taken this before If ongoing troubles lab work and chest x-ray would be indicated Follow-up if problems

## 2017-07-06 ENCOUNTER — Other Ambulatory Visit: Payer: Self-pay | Admitting: Family Medicine

## 2017-07-06 ENCOUNTER — Ambulatory Visit: Payer: Medicaid Other | Admitting: Family Medicine

## 2017-07-08 IMAGING — DX DG CHEST 2V
2 series · 2 of 2 positions shown · non-contrast
Comparison: October 27, 2010.

CLINICAL DATA: Shortness of breath.

EXAM:
CHEST  2 VIEW

[chest pa]
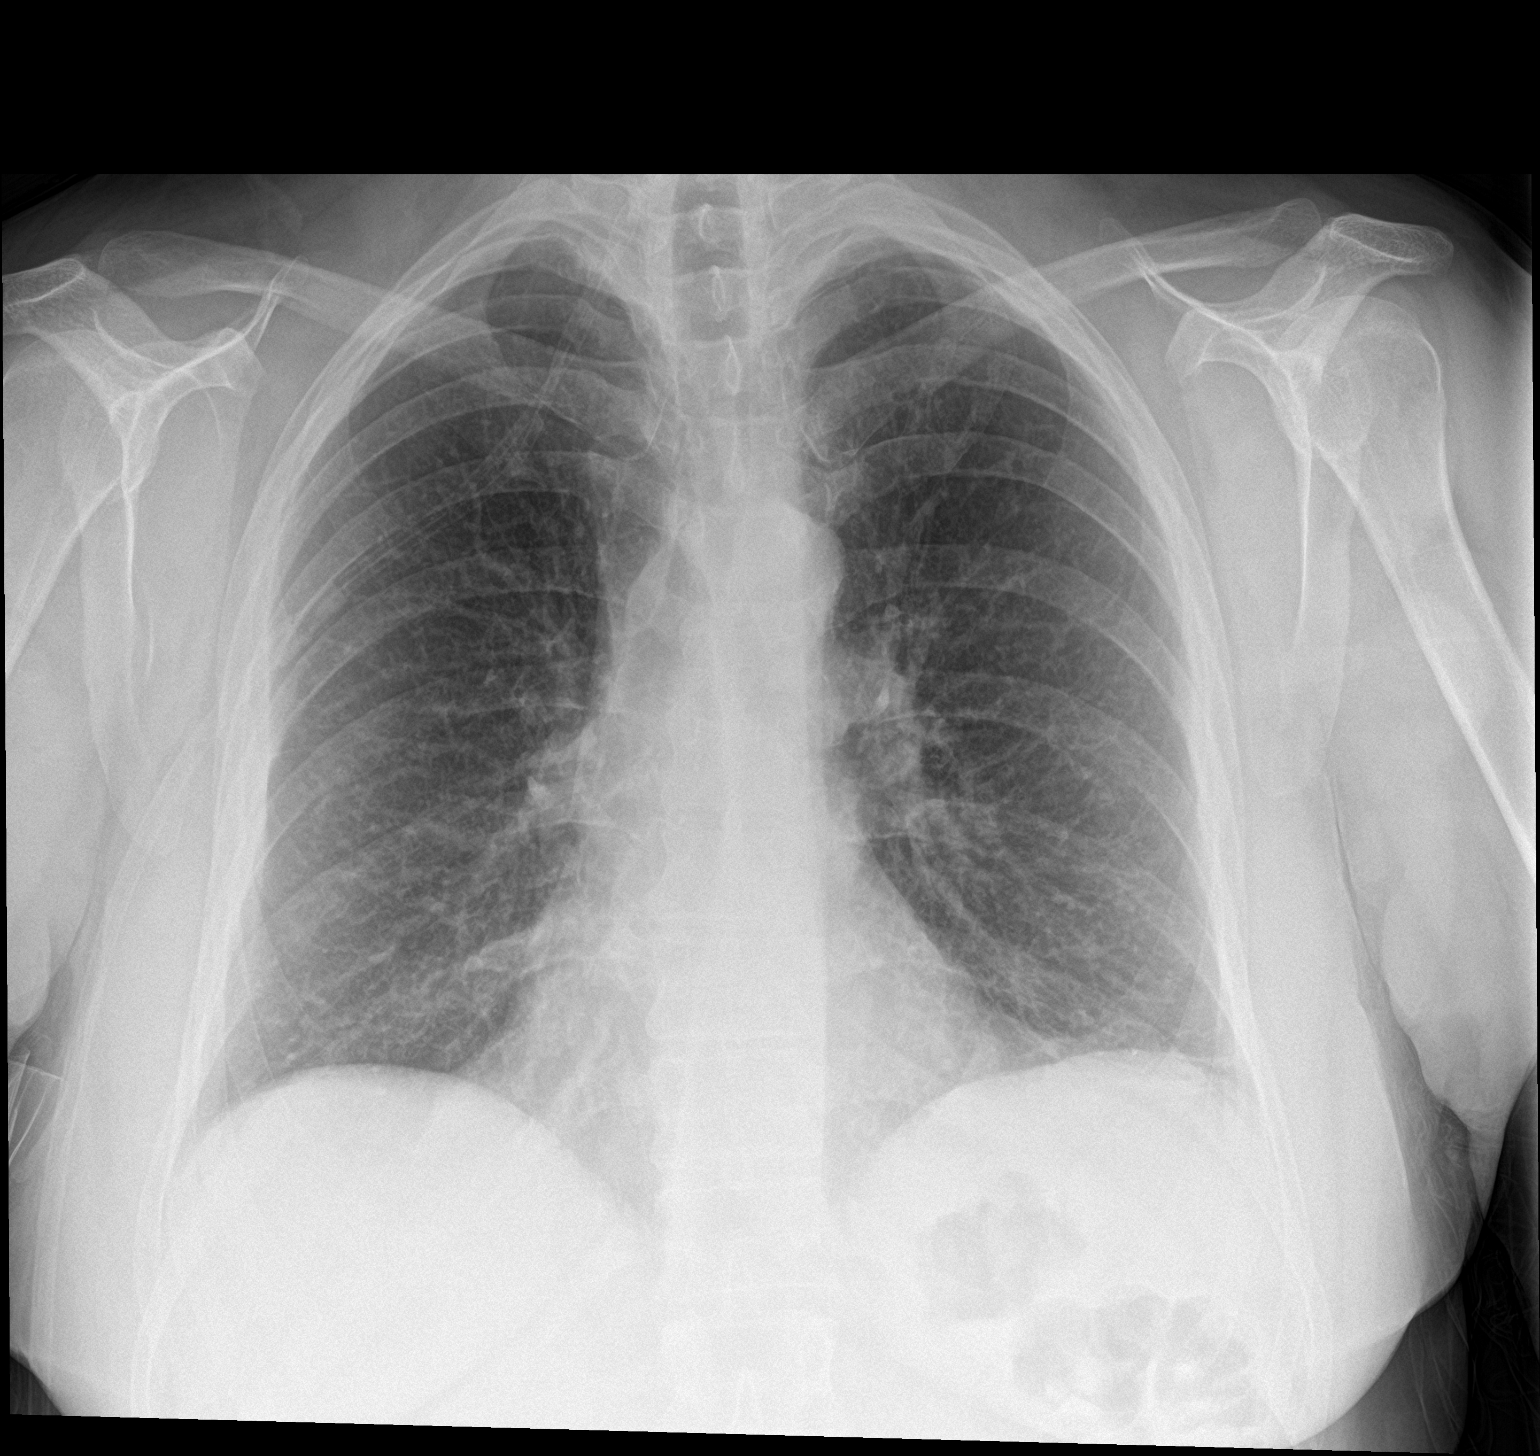

[chest lat]
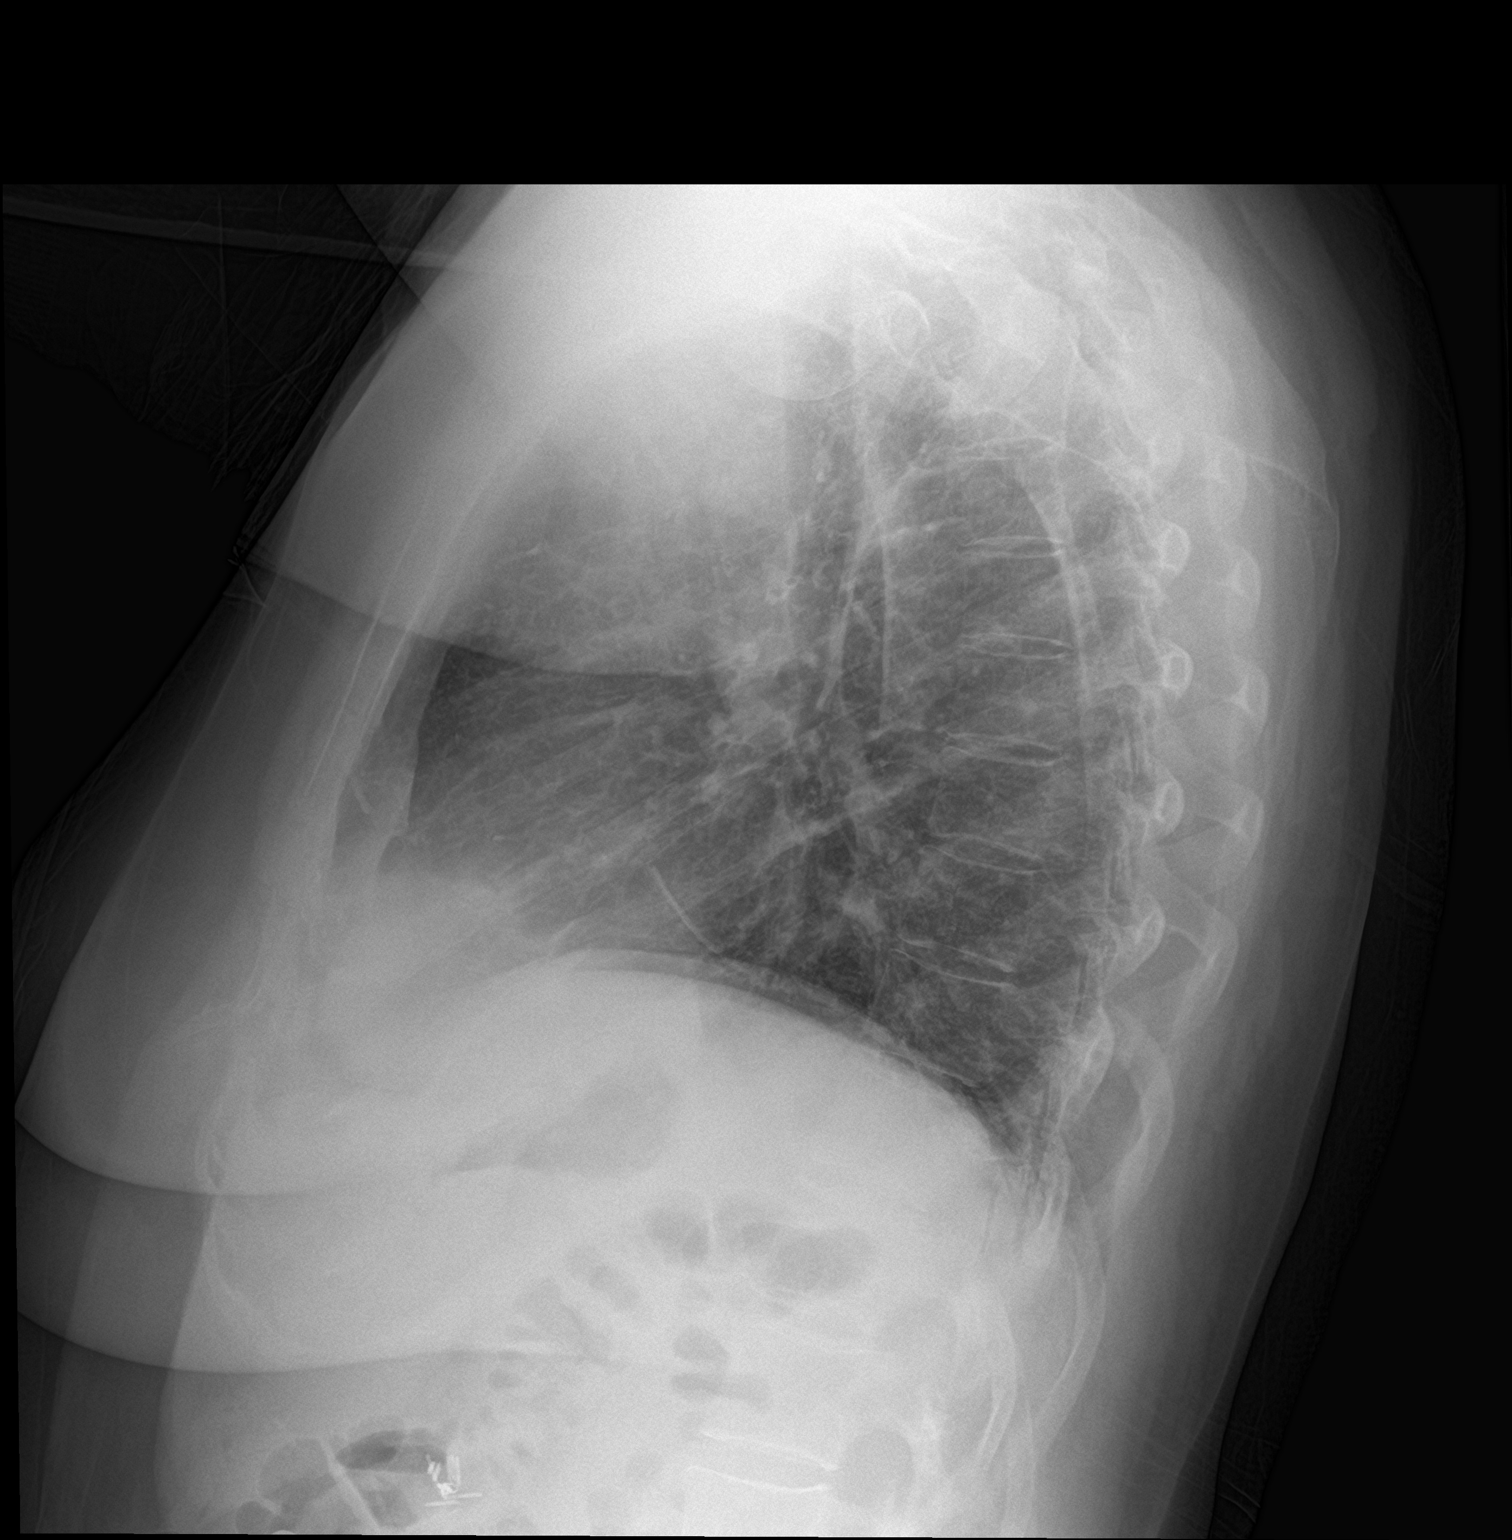

[2 of 2 positions shown; findings below may reference images not displayed]

FINDINGS: The heart size and mediastinal contours are within normal limits.
Both lungs are clear. No pneumothorax or pleural effusion is noted.
The visualized skeletal structures are unremarkable.
IMPRESSION: No active cardiopulmonary disease.

## 2017-07-08 NOTE — Telephone Encounter (Signed)
May refill each times 4

## 2017-07-09 ENCOUNTER — Ambulatory Visit: Payer: Medicaid Other | Admitting: Family Medicine

## 2017-07-09 ENCOUNTER — Encounter: Payer: Self-pay | Admitting: Family Medicine

## 2017-07-09 VITALS — BP 122/78 | Ht 66.0 in | Wt 219.0 lb

## 2017-07-09 DIAGNOSIS — E7849 Other hyperlipidemia: Secondary | ICD-10-CM | POA: Diagnosis not present

## 2017-07-09 DIAGNOSIS — Z23 Encounter for immunization: Secondary | ICD-10-CM | POA: Diagnosis not present

## 2017-07-09 DIAGNOSIS — M5441 Lumbago with sciatica, right side: Secondary | ICD-10-CM

## 2017-07-09 DIAGNOSIS — Z1211 Encounter for screening for malignant neoplasm of colon: Secondary | ICD-10-CM

## 2017-07-09 DIAGNOSIS — D72829 Elevated white blood cell count, unspecified: Secondary | ICD-10-CM

## 2017-07-09 DIAGNOSIS — I10 Essential (primary) hypertension: Secondary | ICD-10-CM | POA: Diagnosis not present

## 2017-07-09 DIAGNOSIS — G8929 Other chronic pain: Secondary | ICD-10-CM | POA: Diagnosis not present

## 2017-07-09 MED ORDER — HYDROCODONE-ACETAMINOPHEN 10-325 MG PO TABS: 1.0000 | ORAL_TABLET | ORAL | 0 refills | Status: DC | PRN

## 2017-07-09 NOTE — Progress Notes (Signed)
Subjective:    Patient ID: Pamela Mendez, female    DOB: 1966/09/04, 51 y.o.   MRN: [id number]  HPI This patient was seen today for chronic pain. Takes for back pain  The medication list was reviewed and updated.   -Compliance with medication: yes  - Number patient states they take daily: four a day  -when was the last dose patient took? yesterday  The patient was advised the importance of maintaining medication and not using illegal substances with these.  Refills needed: yes  The patient was educated that we can provide 3 monthly scripts for their medication, it is their responsibility to follow the instructions.  Side effects or complications from medications: No side effects.  But medicine not doing a great job with her back pain and sciatica.  Patient is aware that pain medications are meant to minimize the severity of the pain to allow their pain levels to improve to allow for better function. They are aware of that pain medications cannot totally remove their pain.  Due for UDT ( at least once per year) :done at last visit  Concerns about bilateral leg pain over the past several weeks. Pain starts in hip.    Her white blood count on recent lab work was slightly elevated therefore we need to repeat this.  She denies any major issues going on denies night sweats fever chills  Calcium was little bit low on previous lab work needs follow-up lab work she is on medications that can alter electrolytes  Blood pressure under decent control takes her medicine on a regular basis  Patient does relate that her pain medicine does not do a good job controlling her pain currently she would like to go up slightly with this once her pain is doing better she is considering possibly reducing it.  Review of Systems  Constitutional: Negative for activity change and appetite change.  HENT: Negative for congestion.   Respiratory: Negative for cough.   Cardiovascular: Negative for chest  pain.  Gastrointestinal: Negative for abdominal pain and vomiting.  Skin: Negative for color change.  Neurological: Negative for weakness.  Psychiatric/Behavioral: Negative for confusion.       Objective:   Physical Exam  Constitutional: She appears well-nourished. No distress.  HENT:  Head: Normocephalic.  Right Ear: External ear normal.  Left Ear: External ear normal.  Eyes: Right eye exhibits no discharge. Left eye exhibits no discharge.  Neck: No tracheal deviation present.  Cardiovascular: Normal rate, regular rhythm and normal heart sounds.  No murmur heard. Pulmonary/Chest: Effort normal and breath sounds normal. No respiratory distress. She has no wheezes. She has no rales.  Musculoskeletal: She exhibits no edema.  Lymphadenopathy:    She has no cervical adenopathy.  Neurological: She is alert.  Psychiatric: Her behavior is normal.  Vitals reviewed.   25 minutes was spent with the patient. Greater than half the time was spent in discussion and answering questions and counseling regarding the issues that the patient came in for today.       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

## 2017-07-09 NOTE — Patient Instructions (Signed)

## 2017-07-10 LAB — BASIC METABOLIC PANEL
BUN/Creatinine Ratio: 14 (ref 9–23)
BUN: 15 mg/dL (ref 6–24)
CO2: 27 mmol/L (ref 20–29)
CREATININE: 1.04 mg/dL — AB (ref 0.57–1.00)
Calcium: 9.3 mg/dL (ref 8.7–10.2)
Chloride: 104 mmol/L (ref 96–106)
GFR, EST AFRICAN AMERICAN: 72 mL/min/{1.73_m2} (ref >59)
GFR, EST NON AFRICAN AMERICAN: 63 mL/min/{1.73_m2} (ref >59)
Glucose: 105 mg/dL — ABNORMAL HIGH (ref 65–99)
POTASSIUM: 4.5 mmol/L (ref 3.5–5.2)
SODIUM: 143 mmol/L (ref 134–144)

## 2017-07-10 LAB — CBC WITH DIFFERENTIAL/PLATELET
Basophils Absolute: 0.1 10*3/uL (ref 0.0–0.2)
Basos: 1 %
EOS (ABSOLUTE): 0.4 10*3/uL (ref 0.0–0.4)
EOS: 5 %
HEMATOCRIT: 45.1 % (ref 34.0–46.6)
Hemoglobin: 14.9 g/dL (ref 11.1–15.9)
Immature Grans (Abs): 0 10*3/uL (ref 0.0–0.1)
Immature Granulocytes: 0 %
LYMPHS ABS: 2.1 10*3/uL (ref 0.7–3.1)
Lymphs: 26 %
MCH: 29.6 pg (ref 26.6–33.0)
MCHC: 33 g/dL (ref 31.5–35.7)
MCV: 90 fL (ref 79–97)
MONOS ABS: 0.7 10*3/uL (ref 0.1–0.9)
Monocytes: 8 %
NEUTROS ABS: 4.9 10*3/uL (ref 1.4–7.0)
Neutrophils: 60 %
Platelets: 277 10*3/uL (ref 150–379)
RBC: 5.04 x10E6/uL (ref 3.77–5.28)
RDW: 14 % (ref 12.3–15.4)
WBC: 8.2 10*3/uL (ref 3.4–10.8)

## 2017-08-06 ENCOUNTER — Telehealth: Payer: Self-pay | Admitting: Family Medicine

## 2017-08-06 NOTE — Telephone Encounter (Signed)
Patient states she is adjusting well to the increase of the Hydrocodone 10/325 One po Q 4 hours prn,and she would like for you to release the other two rx. Please advise.Thanks,CS

## 2017-08-06 NOTE — Telephone Encounter (Signed)
Patient may have 2 additional prescriptions for her hydrocodone 10 mg/325, #120, 1 4 times daily.  Please find out from her pharmacy the date she got her last prescription and date the next 2 prescriptions accordingly thank you patient will need to follow-up office visit toward the end of her last prescription

## 2017-08-06 NOTE — Telephone Encounter (Signed)
Patient said that the new pain medication that Dr. Lorin Picket put her on seems to be working a little bit better and would like to come pick up the other 2 prescriptions for this.

## 2017-08-07 ENCOUNTER — Other Ambulatory Visit: Payer: Self-pay | Admitting: *Deleted

## 2017-08-07 MED ORDER — HYDROCODONE-ACETAMINOPHEN 10-325 MG PO TABS: 1.0000 | ORAL_TABLET | ORAL | 0 refills | Status: DC | PRN

## 2017-08-07 NOTE — Telephone Encounter (Signed)
Called pharm last filled 11/7. Two scripts dated 43 and 60 days out. Pt notified scripts ready for pickup and she needs office visit before last rx runs out

## 2017-08-15 ENCOUNTER — Telehealth: Payer: Self-pay | Admitting: Family Medicine

## 2017-08-15 NOTE — Telephone Encounter (Signed)
Rx Prior Auth APPROVED for pt's HYDROcodone-acetaminophen [facility]) 10-325 MG tablet   Auth # [id number], valid 08/10/17-02/06/18  Faxed approval info to [facility]

## 2017-09-06 ENCOUNTER — Other Ambulatory Visit: Payer: Self-pay | Admitting: Family Medicine

## 2017-09-06 ENCOUNTER — Other Ambulatory Visit: Payer: Self-pay | Admitting: Nurse Practitioner

## 2017-09-07 ENCOUNTER — Other Ambulatory Visit: Payer: Self-pay | Admitting: Nurse Practitioner

## 2017-09-07 ENCOUNTER — Other Ambulatory Visit: Payer: Self-pay | Admitting: Family Medicine

## 2017-09-21 ENCOUNTER — Ambulatory Visit: Payer: Medicaid Other | Admitting: Family Medicine

## 2017-09-21 ENCOUNTER — Encounter: Payer: Self-pay | Admitting: Family Medicine

## 2017-09-21 VITALS — BP 154/100 | Temp 98.5°F | Ht 66.0 in | Wt 217.0 lb

## 2017-09-21 DIAGNOSIS — I10 Essential (primary) hypertension: Secondary | ICD-10-CM

## 2017-09-21 MED ORDER — LISINOPRIL 10 MG PO TABS
10.0000 mg | ORAL_TABLET | Freq: Every day | ORAL | 3 refills | Status: DC
Start: 2017-09-21 — End: 2017-10-09

## 2017-09-21 NOTE — Progress Notes (Signed)
   Subjective:    Patient ID: Pamela Mendez, female    DOB: 1966-01-23, 52 y.o.   MRN: [id number]  Hypertension  Episode onset: BP high for the past 2 -3 days. Associated symptoms include blurred vision and headaches. Treatments tried: lisinopril, toreside.   Tried excedrin with no relief.  The patient's blood pressures usually do pretty well but over the past week they have been she relates some headaches since they have gone up she denies any change in her dietary measures she does try to eat healthy she does admit to drinking multiple sodas per day chest pressure tightness or pain She denies chest tightness pressure pain Review of Systems  Eyes: Positive for blurred vision.  Neurological: Positive for headaches.  See above no abdominal pain     Objective:   Physical Exam Eardrums normal neck no masses lungs clear heart regular Blood pressure checked several times best reading 144/94  Patient will check lab work and couple weeks time    Assessment & Plan:  HTN Watch diet Stay active Check blood pressures periodically send [place] readings Increase lisinopril 10 mg daily Follow-up if progressive troubles or if worse Recheck in 1 month bring blood pressure cuff with

## 2017-09-21 NOTE — Patient Instructions (Signed)
DASH Eating Plan DASH stands for "Dietary Approaches to Stop Hypertension." The DASH eating plan is a healthy eating plan that has been shown to reduce high blood pressure (hypertension). It may also reduce your risk for type 2 diabetes, heart disease, and stroke. The DASH eating plan may also help with weight loss. What are tips for following this plan? General guidelines  Avoid eating more than 2,300 mg (milligrams) of salt (sodium) a day. If you have hypertension, you may need to reduce your sodium intake to 1,500 mg a day.  Limit alcohol intake to no more than 1 drink a day for nonpregnant women and 2 drinks a day for men. One drink equals 12 oz of beer, 5 oz of wine, or 1 oz of hard liquor.  Work with your health care provider to maintain a healthy body weight or to lose weight. Ask what an ideal weight is for you.  Get at least 30 minutes of exercise that causes your heart to beat faster (aerobic exercise) most days of the week. Activities may include walking, swimming, or biking.  Work with your health care provider or diet and nutrition specialist (dietitian) to adjust your eating plan to your individual calorie needs. Reading food labels  Check food labels for the amount of sodium per serving. Choose foods with less than 5 percent of the Daily Value of sodium. Generally, foods with less than 300 mg of sodium per serving fit into this eating plan.  To find whole grains, look for the word "whole" as the first word in the ingredient list. Shopping  Buy products labeled as "low-sodium" or "no salt added."  Buy fresh foods. Avoid canned foods and premade or frozen meals. Cooking  Avoid adding salt when cooking. Use salt-free seasonings or herbs instead of table salt or sea salt. Check with your health care provider or pharmacist before using salt substitutes.  Do not fry foods. Cook foods using healthy methods such as baking, boiling, grilling, and broiling instead.  Cook with  heart-healthy oils, such as olive, canola, soybean, or sunflower oil. Meal planning   Eat a balanced diet that includes: ? 5 or more servings of fruits and vegetables each day. At each meal, try to fill half of your plate with fruits and vegetables. ? Up to 6-8 servings of whole grains each day. ? Less than 6 oz of lean meat, poultry, or fish each day. A 3-oz serving of meat is about the same size as a deck of cards. One egg equals 1 oz. ? 2 servings of low-fat dairy each day. ? A serving of nuts, seeds, or beans 5 times each week. ? Heart-healthy fats. Healthy fats called Omega-3 fatty acids are found in foods such as flaxseeds and coldwater fish, like sardines, salmon, and mackerel.  Limit how much you eat of the following: ? Canned or prepackaged foods. ? Food that is high in trans fat, such as fried foods. ? Food that is high in saturated fat, such as fatty meat. ? Sweets, desserts, sugary drinks, and other foods with added sugar. ? Full-fat dairy products.  Do not salt foods before eating.  Try to eat at least 2 vegetarian meals each week.  Eat more home-cooked food and less restaurant, buffet, and fast food.  When eating at a restaurant, ask that your food be prepared with less salt or no salt, if possible. What foods are recommended? The items listed may not be a complete list. Talk with your dietitian about what   dietary choices are best for you. Grains Whole-grain or whole-wheat bread. Whole-grain or whole-wheat pasta. Brown rice. Oatmeal. Quinoa. Bulgur. Whole-grain and low-sodium cereals. Pita bread. Low-fat, low-sodium crackers. Whole-wheat flour tortillas. Vegetables Fresh or frozen vegetables (raw, steamed, roasted, or grilled). Low-sodium or reduced-sodium tomato and vegetable juice. Low-sodium or reduced-sodium tomato sauce and tomato paste. Low-sodium or reduced-sodium canned vegetables. Fruits All fresh, dried, or frozen fruit. Canned fruit in natural juice (without  added sugar). Meat and other protein foods Skinless chicken or turkey. Ground chicken or turkey. Pork with fat trimmed off. Fish and seafood. Egg whites. Dried beans, peas, or lentils. Unsalted nuts, nut butters, and seeds. Unsalted canned beans. Lean cuts of beef with fat trimmed off. Low-sodium, lean deli meat. Dairy Low-fat (1%) or fat-free (skim) milk. Fat-free, low-fat, or reduced-fat cheeses. Nonfat, low-sodium ricotta or cottage cheese. Low-fat or nonfat yogurt. Low-fat, low-sodium cheese. Fats and oils Soft margarine without trans fats. Vegetable oil. Low-fat, reduced-fat, or light mayonnaise and salad dressings (reduced-sodium). Canola, safflower, olive, soybean, and sunflower oils. Avocado. Seasoning and other foods Herbs. Spices. Seasoning mixes without salt. Unsalted popcorn and pretzels. Fat-free sweets. What foods are not recommended? The items listed may not be a complete list. Talk with your dietitian about what dietary choices are best for you. Grains Baked goods made with fat, such as croissants, muffins, or some breads. Dry pasta or rice meal packs. Vegetables Creamed or fried vegetables. Vegetables in a cheese sauce. Regular canned vegetables (not low-sodium or reduced-sodium). Regular canned tomato sauce and paste (not low-sodium or reduced-sodium). Regular tomato and vegetable juice (not low-sodium or reduced-sodium). Pickles. Olives. Fruits Canned fruit in a light or heavy syrup. Fried fruit. Fruit in cream or butter sauce. Meat and other protein foods Fatty cuts of meat. Ribs. Fried meat. Bacon. Sausage. Bologna and other processed lunch meats. Salami. Fatback. Hotdogs. Bratwurst. Salted nuts and seeds. Canned beans with added salt. Canned or smoked fish. Whole eggs or egg yolks. Chicken or turkey with skin. Dairy Whole or 2% milk, cream, and half-and-half. Whole or full-fat cream cheese. Whole-fat or sweetened yogurt. Full-fat cheese. Nondairy creamers. Whipped toppings.  Processed cheese and cheese spreads. Fats and oils Butter. Stick margarine. Lard. Shortening. Ghee. Bacon fat. Tropical oils, such as coconut, palm kernel, or palm oil. Seasoning and other foods Salted popcorn and pretzels. Onion salt, garlic salt, seasoned salt, table salt, and sea salt. Worcestershire sauce. Tartar sauce. Barbecue sauce. Teriyaki sauce. Soy sauce, including reduced-sodium. Steak sauce. Canned and packaged gravies. Fish sauce. Oyster sauce. Cocktail sauce. Horseradish that you find on the shelf. Ketchup. Mustard. Meat flavorings and tenderizers. Bouillon cubes. Hot sauce and Tabasco sauce. Premade or packaged marinades. Premade or packaged taco seasonings. Relishes. Regular salad dressings. Where to find more information:  National Heart, Lung, and Blood Institute: www.nhlbi.nih.gov  American Heart Association: www.heart.org Summary  The DASH eating plan is a healthy eating plan that has been shown to reduce high blood pressure (hypertension). It may also reduce your risk for type 2 diabetes, heart disease, and stroke.  With the DASH eating plan, you should limit salt (sodium) intake to 2,300 mg a day. If you have hypertension, you may need to reduce your sodium intake to 1,500 mg a day.  When on the DASH eating plan, aim to eat more fresh fruits and vegetables, whole grains, lean proteins, low-fat dairy, and heart-healthy fats.  Work with your health care provider or diet and nutrition specialist (dietitian) to adjust your eating plan to your individual   calorie needs. This information is not intended to replace advice given to you by your health care provider. Make sure you discuss any questions you have with your health care provider. Document Released: 08/10/2011 Document Revised: 08/14/2016 Document Reviewed: 08/14/2016 Elsevier Interactive Patient Education  2018 [employer] Inc.  

## 2017-10-09 ENCOUNTER — Encounter: Payer: Self-pay | Admitting: Family Medicine

## 2017-10-09 ENCOUNTER — Ambulatory Visit: Payer: Medicaid Other | Admitting: Family Medicine

## 2017-10-09 VITALS — BP 154/90 | Ht 66.0 in | Wt 219.0 lb

## 2017-10-09 DIAGNOSIS — M5440 Lumbago with sciatica, unspecified side: Secondary | ICD-10-CM | POA: Diagnosis not present

## 2017-10-09 DIAGNOSIS — G8929 Other chronic pain: Secondary | ICD-10-CM

## 2017-10-09 DIAGNOSIS — I10 Essential (primary) hypertension: Secondary | ICD-10-CM | POA: Diagnosis not present

## 2017-10-09 DIAGNOSIS — R5383 Other fatigue: Secondary | ICD-10-CM

## 2017-10-09 DIAGNOSIS — E782 Mixed hyperlipidemia: Secondary | ICD-10-CM

## 2017-10-09 MED ORDER — LISINOPRIL 20 MG PO TABS: 20.0000 mg | ORAL_TABLET | Freq: Every day | ORAL | 5 refills | Status: DC

## 2017-10-09 MED ORDER — HYDROCODONE-ACETAMINOPHEN 10-325 MG PO TABS: 1.0000 | ORAL_TABLET | ORAL | 0 refills | Status: DC | PRN

## 2017-10-09 NOTE — Patient Instructions (Signed)
DASH Eating Plan DASH stands for "Dietary Approaches to Stop Hypertension." The DASH eating plan is a healthy eating plan that has been shown to reduce high blood pressure (hypertension). It may also reduce your risk for type 2 diabetes, heart disease, and stroke. The DASH eating plan may also help with weight loss. What are tips for following this plan? General guidelines  Avoid eating more than 2,300 mg (milligrams) of salt (sodium) a day. If you have hypertension, you may need to reduce your sodium intake to 1,500 mg a day.  Limit alcohol intake to no more than 1 drink a day for nonpregnant women and 2 drinks a day for men. One drink equals 12 oz of beer, 5 oz of wine, or 1 oz of hard liquor.  Work with your health care provider to maintain a healthy body weight or to lose weight. Ask what an ideal weight is for you.  Get at least 30 minutes of exercise that causes your heart to beat faster (aerobic exercise) most days of the week. Activities may include walking, swimming, or biking.  Work with your health care provider or diet and nutrition specialist (dietitian) to adjust your eating plan to your individual calorie needs. Reading food labels  Check food labels for the amount of sodium per serving. Choose foods with less than 5 percent of the Daily Value of sodium. Generally, foods with less than 300 mg of sodium per serving fit into this eating plan.  To find whole grains, look for the word "whole" as the first word in the ingredient list. Shopping  Buy products labeled as "low-sodium" or "no salt added."  Buy fresh foods. Avoid canned foods and premade or frozen meals. Cooking  Avoid adding salt when cooking. Use salt-free seasonings or herbs instead of table salt or sea salt. Check with your health care provider or pharmacist before using salt substitutes.  Do not fry foods. Cook foods using healthy methods such as baking, boiling, grilling, and broiling instead.  Cook with  heart-healthy oils, such as olive, canola, soybean, or sunflower oil. Meal planning   Eat a balanced diet that includes: ? 5 or more servings of fruits and vegetables each day. At each meal, try to fill half of your plate with fruits and vegetables. ? Up to 6-8 servings of whole grains each day. ? Less than 6 oz of lean meat, poultry, or fish each day. A 3-oz serving of meat is about the same size as a deck of cards. One egg equals 1 oz. ? 2 servings of low-fat dairy each day. ? A serving of nuts, seeds, or beans 5 times each week. ? Heart-healthy fats. Healthy fats called Omega-3 fatty acids are found in foods such as flaxseeds and coldwater fish, like sardines, salmon, and mackerel.  Limit how much you eat of the following: ? Canned or prepackaged foods. ? Food that is high in trans fat, such as fried foods. ? Food that is high in saturated fat, such as fatty meat. ? Sweets, desserts, sugary drinks, and other foods with added sugar. ? Full-fat dairy products.  Do not salt foods before eating.  Try to eat at least 2 vegetarian meals each week.  Eat more home-cooked food and less restaurant, buffet, and fast food.  When eating at a restaurant, ask that your food be prepared with less salt or no salt, if possible. What foods are recommended? The items listed may not be a complete list. Talk with your dietitian about what   dietary choices are best for you. Grains Whole-grain or whole-wheat bread. Whole-grain or whole-wheat pasta. Brown rice. Oatmeal. Quinoa. Bulgur. Whole-grain and low-sodium cereals. Pita bread. Low-fat, low-sodium crackers. Whole-wheat flour tortillas. Vegetables Fresh or frozen vegetables (raw, steamed, roasted, or grilled). Low-sodium or reduced-sodium tomato and vegetable juice. Low-sodium or reduced-sodium tomato sauce and tomato paste. Low-sodium or reduced-sodium canned vegetables. Fruits All fresh, dried, or frozen fruit. Canned fruit in natural juice (without  added sugar). Meat and other protein foods Skinless chicken or turkey. Ground chicken or turkey. Pork with fat trimmed off. Fish and seafood. Egg whites. Dried beans, peas, or lentils. Unsalted nuts, nut butters, and seeds. Unsalted canned beans. Lean cuts of beef with fat trimmed off. Low-sodium, lean deli meat. Dairy Low-fat (1%) or fat-free (skim) milk. Fat-free, low-fat, or reduced-fat cheeses. Nonfat, low-sodium ricotta or cottage cheese. Low-fat or nonfat yogurt. Low-fat, low-sodium cheese. Fats and oils Soft margarine without trans fats. Vegetable oil. Low-fat, reduced-fat, or light mayonnaise and salad dressings (reduced-sodium). Canola, safflower, olive, soybean, and sunflower oils. Avocado. Seasoning and other foods Herbs. Spices. Seasoning mixes without salt. Unsalted popcorn and pretzels. Fat-free sweets. What foods are not recommended? The items listed may not be a complete list. Talk with your dietitian about what dietary choices are best for you. Grains Baked goods made with fat, such as croissants, muffins, or some breads. Dry pasta or rice meal packs. Vegetables Creamed or fried vegetables. Vegetables in a cheese sauce. Regular canned vegetables (not low-sodium or reduced-sodium). Regular canned tomato sauce and paste (not low-sodium or reduced-sodium). Regular tomato and vegetable juice (not low-sodium or reduced-sodium). Pickles. Olives. Fruits Canned fruit in a light or heavy syrup. Fried fruit. Fruit in cream or butter sauce. Meat and other protein foods Fatty cuts of meat. Ribs. Fried meat. Bacon. Sausage. Bologna and other processed lunch meats. Salami. Fatback. Hotdogs. Bratwurst. Salted nuts and seeds. Canned beans with added salt. Canned or smoked fish. Whole eggs or egg yolks. Chicken or turkey with skin. Dairy Whole or 2% milk, cream, and half-and-half. Whole or full-fat cream cheese. Whole-fat or sweetened yogurt. Full-fat cheese. Nondairy creamers. Whipped toppings.  Processed cheese and cheese spreads. Fats and oils Butter. Stick margarine. Lard. Shortening. Ghee. Bacon fat. Tropical oils, such as coconut, palm kernel, or palm oil. Seasoning and other foods Salted popcorn and pretzels. Onion salt, garlic salt, seasoned salt, table salt, and sea salt. Worcestershire sauce. Tartar sauce. Barbecue sauce. Teriyaki sauce. Soy sauce, including reduced-sodium. Steak sauce. Canned and packaged gravies. Fish sauce. Oyster sauce. Cocktail sauce. Horseradish that you find on the shelf. Ketchup. Mustard. Meat flavorings and tenderizers. Bouillon cubes. Hot sauce and Tabasco sauce. Premade or packaged marinades. Premade or packaged taco seasonings. Relishes. Regular salad dressings. Where to find more information:  National Heart, Lung, and Blood Institute: www.nhlbi.nih.gov  American Heart Association: www.heart.org Summary  The DASH eating plan is a healthy eating plan that has been shown to reduce high blood pressure (hypertension). It may also reduce your risk for type 2 diabetes, heart disease, and stroke.  With the DASH eating plan, you should limit salt (sodium) intake to 2,300 mg a day. If you have hypertension, you may need to reduce your sodium intake to 1,500 mg a day.  When on the DASH eating plan, aim to eat more fresh fruits and vegetables, whole grains, lean proteins, low-fat dairy, and heart-healthy fats.  Work with your health care provider or diet and nutrition specialist (dietitian) to adjust your eating plan to your individual   calorie needs. This information is not intended to replace advice given to you by your health care provider. Make sure you discuss any questions you have with your health care provider. Document Released: 08/10/2011 Document Revised: 08/14/2016 Document Reviewed: 08/14/2016 Elsevier Interactive Patient Education  2018 [employer] Inc.  

## 2017-10-09 NOTE — Progress Notes (Signed)
Subjective:    Patient ID: Pamela Mendez, female    DOB: 1966-05-08, 52 y.o.   MRN: [id number]  HPI This patient was seen today for chronic pain. Takes for back pain and pain in right leg  The medication list was reviewed and updated.   -Compliance with medication: yes  - Number patient states they take daily: 4 a day  -when was the last dose patient took? yesterday  The patient was advised the importance of maintaining medication and not using illegal substances with these.  Here for refills and follow up  The patient was educated that we can provide 3 monthly scripts for their medication, it is their responsibility to follow the instructions.  Side effects or complications from medications: none  Patient is aware that pain medications are meant to minimize the severity of the pain to allow their pain levels to improve to allow for better function. They are aware of that pain medications cannot totally remove their pain.  Due for UDT ( at least once per year) : last one august 2018 Pt states 2 days ago 189/112 and this morning 174/100.   Her blood pressure is not been under good control recently she describes some headaches with it denies chest tightness pressure pain relates compliance with medicine   Patient here for follow-up regarding cholesterol.  Patient does try to maintain a reasonable diet.  Patient does take the medication on a regular basis.  Denies missing a dose.  The patient denies any obvious side effects.  Prior blood work results reviewed with the patient.  The patient is aware of his cholesterol goals and the need to keep it under good control to lessen the risk of disease.  She relates significant fatigue tiredness feeling rundown denies high fever chills sweats denies muscle cramps   Review of Systems  Constitutional: Negative for activity change, fatigue and fever.  HENT: Negative for congestion.   Respiratory: Negative for cough, chest tightness and  shortness of breath.   Cardiovascular: Negative for chest pain and leg swelling.  Gastrointestinal: Negative for abdominal pain.  Skin: Negative for color change.  Neurological: Negative for headaches.  Psychiatric/Behavioral: Negative for behavioral problems.       Objective:   Physical Exam  Constitutional: She appears well-developed and well-nourished. No distress.  HENT:  Head: Normocephalic and atraumatic.  Eyes: Right eye exhibits no discharge. Left eye exhibits no discharge.  Neck: No tracheal deviation present.  Cardiovascular: Normal rate, regular rhythm and normal heart sounds.  No murmur heard. Pulmonary/Chest: Effort normal and breath sounds normal. No respiratory distress. She has no wheezes. She has no rales.  Musculoskeletal: She exhibits no edema.  Lymphadenopathy:    She has no cervical adenopathy.  Neurological: She is alert. She exhibits normal muscle tone.  Skin: Skin is warm and dry. No erythema.  Psychiatric: Her behavior is normal.  Vitals reviewed.  25 minutes was spent with the patient.  This statement verifies that 25 minutes was indeed spent with the patient. Greater than half the time was spent in discussion, counseling and answering questions  regarding the issues that the patient came in for today as reflected in the diagnosis (s) please refer to documentation for further details. Patient was kept in the office recheck blood pressure several times over a 25-minute span discussed blood pressure in detail       Assessment & Plan:  1. Essential hypertension Subpar control.  Patient at risk for complications therefore increase lisinopril 20  mg daily patient will monitor herself but she will follow-up here in 3 weeks she will do her lab work in 10-14 days she will do a good job watching diet and stay physically active - Basic metabolic panel  2. Chronic low back pain with sciatica, sciatica laterality unspecified, unspecified back pain  laterality Chronic pain stable drug registry checked 3 prescriptions given  3. Mixed hyperlipidemia Patient takes her medicine watch his diet needs to recheck lab work await the results previous labs reviewed - Lipid panel  4. Other fatigue Lab work ordered because of fatigue tiredness as well as elevated blood pressure - Hepatic function panel - TSH

## 2017-10-18 ENCOUNTER — Ambulatory Visit: Payer: Medicaid Other | Admitting: Family Medicine

## 2017-10-27 LAB — BASIC METABOLIC PANEL
BUN / CREAT RATIO: 12 (ref 9–23)
BUN: 11 mg/dL (ref 6–24)
CALCIUM: 9.6 mg/dL (ref 8.7–10.2)
CHLORIDE: 106 mmol/L (ref 96–106)
CO2: 22 mmol/L (ref 20–29)
Creatinine, Ser: 0.95 mg/dL (ref 0.57–1.00)
GFR, EST AFRICAN AMERICAN: 80 mL/min/{1.73_m2} (ref >59)
GFR, EST NON AFRICAN AMERICAN: 70 mL/min/{1.73_m2} (ref >59)
Glucose: 109 mg/dL — ABNORMAL HIGH (ref 65–99)
Potassium: 4.1 mmol/L (ref 3.5–5.2)
Sodium: 145 mmol/L — ABNORMAL HIGH (ref 134–144)

## 2017-10-27 LAB — HEPATIC FUNCTION PANEL
ALBUMIN: 4.1 g/dL (ref 3.5–5.5)
ALT: 16 IU/L (ref 0–32)
AST: 13 IU/L (ref 0–40)
Alkaline Phosphatase: 68 IU/L (ref 39–117)
BILIRUBIN TOTAL: 0.3 mg/dL (ref 0.0–1.2)
BILIRUBIN, DIRECT: 0.1 mg/dL (ref 0.00–0.40)
Total Protein: 6.7 g/dL (ref 6.0–8.5)

## 2017-10-27 LAB — LIPID PANEL
CHOL/HDL RATIO: 4 ratio (ref 0.0–4.4)
Cholesterol, Total: 188 mg/dL (ref 100–199)
HDL: 47 mg/dL (ref >39)
LDL Calculated: 110 mg/dL — ABNORMAL HIGH (ref 0–99)
TRIGLYCERIDES: 153 mg/dL — AB (ref 0–149)
VLDL Cholesterol Cal: 31 mg/dL (ref 5–40)

## 2017-10-27 LAB — TSH: TSH: 1.09 u[IU]/mL (ref 0.450–4.500)

## 2017-10-29 ENCOUNTER — Ambulatory Visit: Payer: Medicaid Other | Admitting: Family Medicine

## 2017-10-29 ENCOUNTER — Encounter: Payer: Self-pay | Admitting: Family Medicine

## 2017-10-29 VITALS — BP 134/86 | Temp 97.9°F | Ht 66.0 in | Wt 218.0 lb

## 2017-10-29 DIAGNOSIS — B9689 Other specified bacterial agents as the cause of diseases classified elsewhere: Secondary | ICD-10-CM | POA: Diagnosis not present

## 2017-10-29 DIAGNOSIS — J019 Acute sinusitis, unspecified: Secondary | ICD-10-CM | POA: Diagnosis not present

## 2017-10-29 DIAGNOSIS — I10 Essential (primary) hypertension: Secondary | ICD-10-CM | POA: Diagnosis not present

## 2017-10-29 DIAGNOSIS — R7303 Prediabetes: Secondary | ICD-10-CM

## 2017-10-29 MED ORDER — PRAVASTATIN SODIUM 40 MG PO TABS: 40.0000 mg | ORAL_TABLET | Freq: Every day | ORAL | 5 refills | Status: DC

## 2017-10-29 MED ORDER — FLUTICASONE PROPIONATE 50 MCG/ACT NA SUSP: 2.0000 | Freq: Every day | NASAL | 5 refills | Status: DC

## 2017-10-29 MED ORDER — PANTOPRAZOLE SODIUM 40 MG PO TBEC: 40.0000 mg | DELAYED_RELEASE_TABLET | Freq: Every day | ORAL | 5 refills | Status: DC

## 2017-10-29 MED ORDER — CETIRIZINE HCL 10 MG PO TABS: ORAL_TABLET | ORAL | 5 refills | Status: DC

## 2017-10-29 MED ORDER — ALBUTEROL SULFATE HFA 108 (90 BASE) MCG/ACT IN AERS: 2.0000 | INHALATION_SPRAY | Freq: Four times a day (QID) | RESPIRATORY_TRACT | 2 refills | Status: DC | PRN

## 2017-10-29 MED ORDER — POTASSIUM CHLORIDE CRYS ER 20 MEQ PO TBCR: EXTENDED_RELEASE_TABLET | ORAL | 5 refills | Status: DC

## 2017-10-29 MED ORDER — TORSEMIDE 20 MG PO TABS: ORAL_TABLET | ORAL | 5 refills | Status: DC

## 2017-10-29 MED ORDER — DOXYCYCLINE HYCLATE 100 MG PO TABS: 100.0000 mg | ORAL_TABLET | Freq: Two times a day (BID) | ORAL | 0 refills | Status: DC

## 2017-10-29 NOTE — Progress Notes (Signed)
   Subjective:    Patient ID: Pamela Mendez, female    DOB: 1966/03/30, 52 y.o.   MRN: [id number]  Hypertension  Pertinent negatives include no chest pain or shortness of breath.  BP med increased 3 weeks ago. Pt states BP is running good now.   Had bloodwork done last week.  Lab work was reviewed with her in detail today  Sinus pressure, ear pain. Started 2 -3 days ago.  Sinus pressure drainage coughing denies high fever chills sweats   Review of Systems  Constitutional: Negative for activity change and fever.  HENT: Positive for rhinorrhea, sinus pressure and sinus pain. Negative for congestion and ear pain.   Eyes: Negative for discharge.  Respiratory: Negative for cough, shortness of breath and wheezing.   Cardiovascular: Negative for chest pain.       Objective:   Physical Exam  Constitutional: She appears well-developed and well-nourished. No distress.  HENT:  Head: Normocephalic and atraumatic.  Eyes: Right eye exhibits no discharge. Left eye exhibits no discharge.  Neck: No tracheal deviation present.  Cardiovascular: Normal rate, regular rhythm and normal heart sounds.  No murmur heard. Pulmonary/Chest: Effort normal and breath sounds normal. No respiratory distress. She has no wheezes. She has no rales.  Musculoskeletal: She exhibits no edema.  Lymphadenopathy:    She has no cervical adenopathy.  Neurological: She is alert. She exhibits normal muscle tone.  Skin: Skin is warm and dry. No erythema.  Psychiatric: Her behavior is normal.  Vitals reviewed.    Results for orders placed or performed in visit on 10/09/17  Lipid panel  Result Value Ref Range   Cholesterol, Total 188 100 - 199 mg/dL   Triglycerides 784 (H) 0 - 149 mg/dL   HDL 47 >12 mg/dL   VLDL Cholesterol Cal 31 5 - 40 mg/dL   LDL Calculated 820 (H) 0 - 99 mg/dL   Chol/HDL Ratio 4.0 0.0 - 4.4 ratio  Hepatic function panel  Result Value Ref Range   Total Protein 6.7 6.0 - 8.5 g/dL   Albumin 4.1  3.5 - 5.5 g/dL   Bilirubin Total 0.3 0.0 - 1.2 mg/dL   Bilirubin, Direct 8.13 0.00 - 0.40 mg/dL   Alkaline Phosphatase 68 39 - 117 IU/L   AST 13 0 - 40 IU/L   ALT 16 0 - 32 IU/L  Basic metabolic panel  Result Value Ref Range   Glucose 109 (H) 65 - 99 mg/dL   BUN 11 6 - 24 mg/dL   Creatinine, Ser 8.87 0.57 - 1.00 mg/dL   GFR calc non Af Amer 70 >59 mL/min/1.73   GFR calc Af Amer 80 >59 mL/min/1.73   BUN/Creatinine Ratio 12 9 - 23   Sodium 145 (H) 134 - 144 mmol/L   Potassium 4.1 3.5 - 5.2 mmol/L   Chloride 106 96 - 106 mmol/L   CO2 22 20 - 29 mmol/L   Calcium 9.6 8.7 - 10.2 mg/dL  TSH  Result Value Ref Range   TSH 1.090 0.450 - 4.500 uIU/mL          Assessment & Plan:  HTN good control continue current measures  Hyperlipidemia work hard on diet  Sinus infection antibiotics prescribed  Pain medication follow-up in a couple months  Fasting hyperglycemia very important for patient to minimize starches in the diet stay physically active try to lose weight watch diet closely

## 2017-10-29 NOTE — Patient Instructions (Signed)
Diabetes Mellitus and Nutrition When you have diabetes (diabetes mellitus), it is very important to have healthy eating habits because your blood sugar (glucose) levels are greatly affected by what you eat and drink. Eating healthy foods in the appropriate amounts, at about the same times every day, can help you:  Control your blood glucose.  Lower your risk of heart disease.  Improve your blood pressure.  Reach or maintain a healthy weight.  Every person with diabetes is different, and each person has different needs for a meal plan. Your health care provider may recommend that you work with a diet and nutrition specialist (dietitian) to make a meal plan that is best for you. Your meal plan may vary depending on factors such as:  The calories you need.  The medicines you take.  Your weight.  Your blood glucose, blood pressure, and cholesterol levels.  Your activity level.  Other health conditions you have, such as heart or kidney disease.  How do carbohydrates affect me? Carbohydrates affect your blood glucose level more than any other type of food. Eating carbohydrates naturally increases the amount of glucose in your blood. Carbohydrate counting is a method for keeping track of how many carbohydrates you eat. Counting carbohydrates is important to keep your blood glucose at a healthy level, especially if you use insulin or take certain oral diabetes medicines. It is important to know how many carbohydrates you can safely have in each meal. This is different for every person. Your dietitian can help you calculate how many carbohydrates you should have at each meal and for snack. Foods that contain carbohydrates include:  Bread, cereal, rice, pasta, and crackers.  Potatoes and corn.  Peas, beans, and lentils.  Milk and yogurt.  Fruit and juice.  Desserts, such as cakes, cookies, ice cream, and candy.  How does alcohol affect me? Alcohol can cause a sudden decrease in blood  glucose (hypoglycemia), especially if you use insulin or take certain oral diabetes medicines. Hypoglycemia can be a life-threatening condition. Symptoms of hypoglycemia (sleepiness, dizziness, and confusion) are similar to symptoms of having too much alcohol. If your health care provider says that alcohol is safe for you, follow these guidelines:  Limit alcohol intake to no more than 1 drink per day for nonpregnant women and 2 drinks per day for men. One drink equals 12 oz of beer, 5 oz of wine, or 1 oz of hard liquor.  Do not drink on an empty stomach.  Keep yourself hydrated with water, diet soda, or unsweetened iced tea.  Keep in mind that regular soda, juice, and other mixers may contain a lot of sugar and must be counted as carbohydrates.  What are tips for following this plan? Reading food labels  Start by checking the serving size on the label. The amount of calories, carbohydrates, fats, and other nutrients listed on the label are based on one serving of the food. Many foods contain more than one serving per package.  Check the total grams (g) of carbohydrates in one serving. You can calculate the number of servings of carbohydrates in one serving by dividing the total carbohydrates by 15. For example, if a food has 30 g of total carbohydrates, it would be equal to 2 servings of carbohydrates.  Check the number of grams (g) of saturated and trans fats in one serving. Choose foods that have low or no amount of these fats.  Check the number of milligrams (mg) of sodium in one serving. Most people   should limit total sodium intake to less than 2,300 mg per day.  Always check the nutrition information of foods labeled as "low-fat" or "nonfat". These foods may be higher in added sugar or refined carbohydrates and should be avoided.  Talk to your dietitian to identify your daily goals for nutrients listed on the label. Shopping  Avoid buying canned, premade, or processed foods. These  foods tend to be high in fat, sodium, and added sugar.  Shop around the outside edge of the grocery store. This includes fresh fruits and vegetables, bulk grains, fresh meats, and fresh dairy. Cooking  Use low-heat cooking methods, such as baking, instead of high-heat cooking methods like deep frying.  Cook using healthy oils, such as olive, canola, or sunflower oil.  Avoid cooking with butter, cream, or high-fat meats. Meal planning  Eat meals and snacks regularly, preferably at the same times every day. Avoid going long periods of time without eating.  Eat foods high in fiber, such as fresh fruits, vegetables, beans, and whole grains. Talk to your dietitian about how many servings of carbohydrates you can eat at each meal.  Eat 4-6 ounces of lean protein each day, such as lean meat, chicken, fish, eggs, or tofu. 1 ounce is equal to 1 ounce of meat, chicken, or fish, 1 egg, or 1/4 cup of tofu.  Eat some foods each day that contain healthy fats, such as avocado, nuts, seeds, and fish. Lifestyle   Check your blood glucose regularly.  Exercise at least 30 minutes 5 or more days each week, or as told by your health care provider.  Take medicines as told by your health care provider.  Do not use any products that contain nicotine or tobacco, such as cigarettes and e-cigarettes. If you need help quitting, ask your health care provider.  Work with a counselor or diabetes educator to identify strategies to manage stress and any emotional and social challenges. What are some questions to ask my health care provider?  Do I need to meet with a diabetes educator?  Do I need to meet with a dietitian?  What number can I call if I have questions?  When are the best times to check my blood glucose? Where to find more information:  American Diabetes Association: diabetes.org/food-and-fitness/food  Academy of Nutrition and Dietetics:  www.eatright.org/resources/health/diseases-and-conditions/diabetes  National Institute of Diabetes and Digestive and Kidney Diseases (NIH): www.niddk.nih.gov/health-information/diabetes/overview/diet-eating-physical-activity Summary  A healthy meal plan will help you control your blood glucose and maintain a healthy lifestyle.  Working with a diet and nutrition specialist (dietitian) can help you make a meal plan that is best for you.  Keep in mind that carbohydrates and alcohol have immediate effects on your blood glucose levels. It is important to count carbohydrates and to use alcohol carefully. This information is not intended to replace advice given to you by your health care provider. Make sure you discuss any questions you have with your health care provider. Document Released: 05/18/2005 Document Revised: 09/25/2016 Document Reviewed: 09/25/2016 Elsevier Interactive Patient Education  2018 Elsevier Inc.  

## 2017-11-06 ENCOUNTER — Other Ambulatory Visit: Payer: Self-pay | Admitting: Nurse Practitioner

## 2017-12-04 ENCOUNTER — Encounter: Payer: Self-pay | Admitting: Family Medicine

## 2017-12-04 ENCOUNTER — Ambulatory Visit: Payer: Medicaid Other | Admitting: Family Medicine

## 2017-12-04 ENCOUNTER — Encounter (INDEPENDENT_AMBULATORY_CARE_PROVIDER_SITE_OTHER): Payer: Self-pay

## 2017-12-04 ENCOUNTER — Other Ambulatory Visit: Payer: Self-pay | Admitting: Family Medicine

## 2017-12-04 VITALS — BP 142/100 | Ht 66.0 in | Wt 224.0 lb

## 2017-12-04 DIAGNOSIS — I10 Essential (primary) hypertension: Secondary | ICD-10-CM

## 2017-12-04 DIAGNOSIS — G8929 Other chronic pain: Secondary | ICD-10-CM

## 2017-12-04 DIAGNOSIS — E782 Mixed hyperlipidemia: Secondary | ICD-10-CM

## 2017-12-04 DIAGNOSIS — M5440 Lumbago with sciatica, unspecified side: Secondary | ICD-10-CM

## 2017-12-04 DIAGNOSIS — Z1211 Encounter for screening for malignant neoplasm of colon: Secondary | ICD-10-CM | POA: Diagnosis not present

## 2017-12-04 MED ORDER — HYDROCODONE-ACETAMINOPHEN 10-325 MG PO TABS: 1.0000 | ORAL_TABLET | ORAL | 0 refills | Status: DC | PRN

## 2017-12-04 MED ORDER — METHOCARBAMOL 500 MG PO TABS
ORAL_TABLET | ORAL | 2 refills | Status: DC
Start: 2017-12-04 — End: 2018-03-06

## 2017-12-04 NOTE — Patient Instructions (Signed)
DASH Eating Plan DASH stands for "Dietary Approaches to Stop Hypertension." The DASH eating plan is a healthy eating plan that has been shown to reduce high blood pressure (hypertension). It may also reduce your risk for type 2 diabetes, heart disease, and stroke. The DASH eating plan may also help with weight loss. What are tips for following this plan? General guidelines  Avoid eating more than 2,300 mg (milligrams) of salt (sodium) a day. If you have hypertension, you may need to reduce your sodium intake to 1,500 mg a day.  Limit alcohol intake to no more than 1 drink a day for nonpregnant women and 2 drinks a day for men. One drink equals 12 oz of beer, 5 oz of wine, or 1 oz of hard liquor.  Work with your health care provider to maintain a healthy body weight or to lose weight. Ask what an ideal weight is for you.  Get at least 30 minutes of exercise that causes your heart to beat faster (aerobic exercise) most days of the week. Activities may include walking, swimming, or biking.  Work with your health care provider or diet and nutrition specialist (dietitian) to adjust your eating plan to your individual calorie needs. Reading food labels  Check food labels for the amount of sodium per serving. Choose foods with less than 5 percent of the Daily Value of sodium. Generally, foods with less than 300 mg of sodium per serving fit into this eating plan.  To find whole grains, look for the word "whole" as the first word in the ingredient list. Shopping  Buy products labeled as "low-sodium" or "no salt added."  Buy fresh foods. Avoid canned foods and premade or frozen meals. Cooking  Avoid adding salt when cooking. Use salt-free seasonings or herbs instead of table salt or sea salt. Check with your health care provider or pharmacist before using salt substitutes.  Do not fry foods. Cook foods using healthy methods such as baking, boiling, grilling, and broiling instead.  Cook with  heart-healthy oils, such as olive, canola, soybean, or sunflower oil. Meal planning   Eat a balanced diet that includes: ? 5 or more servings of fruits and vegetables each day. At each meal, try to fill half of your plate with fruits and vegetables. ? Up to 6-8 servings of whole grains each day. ? Less than 6 oz of lean meat, poultry, or fish each day. A 3-oz serving of meat is about the same size as a deck of cards. One egg equals 1 oz. ? 2 servings of low-fat dairy each day. ? A serving of nuts, seeds, or beans 5 times each week. ? Heart-healthy fats. Healthy fats called Omega-3 fatty acids are found in foods such as flaxseeds and coldwater fish, like sardines, salmon, and mackerel.  Limit how much you eat of the following: ? Canned or prepackaged foods. ? Food that is high in trans fat, such as fried foods. ? Food that is high in saturated fat, such as fatty meat. ? Sweets, desserts, sugary drinks, and other foods with added sugar. ? Full-fat dairy products.  Do not salt foods before eating.  Try to eat at least 2 vegetarian meals each week.  Eat more home-cooked food and less restaurant, buffet, and fast food.  When eating at a restaurant, ask that your food be prepared with less salt or no salt, if possible. What foods are recommended? The items listed may not be a complete list. Talk with your dietitian about what   dietary choices are best for you. Grains Whole-grain or whole-wheat bread. Whole-grain or whole-wheat pasta. Brown rice. Oatmeal. Quinoa. Bulgur. Whole-grain and low-sodium cereals. Pita bread. Low-fat, low-sodium crackers. Whole-wheat flour tortillas. Vegetables Fresh or frozen vegetables (raw, steamed, roasted, or grilled). Low-sodium or reduced-sodium tomato and vegetable juice. Low-sodium or reduced-sodium tomato sauce and tomato paste. Low-sodium or reduced-sodium canned vegetables. Fruits All fresh, dried, or frozen fruit. Canned fruit in natural juice (without  added sugar). Meat and other protein foods Skinless chicken or turkey. Ground chicken or turkey. Pork with fat trimmed off. Fish and seafood. Egg whites. Dried beans, peas, or lentils. Unsalted nuts, nut butters, and seeds. Unsalted canned beans. Lean cuts of beef with fat trimmed off. Low-sodium, lean deli meat. Dairy Low-fat (1%) or fat-free (skim) milk. Fat-free, low-fat, or reduced-fat cheeses. Nonfat, low-sodium ricotta or cottage cheese. Low-fat or nonfat yogurt. Low-fat, low-sodium cheese. Fats and oils Soft margarine without trans fats. Vegetable oil. Low-fat, reduced-fat, or light mayonnaise and salad dressings (reduced-sodium). Canola, safflower, olive, soybean, and sunflower oils. Avocado. Seasoning and other foods Herbs. Spices. Seasoning mixes without salt. Unsalted popcorn and pretzels. Fat-free sweets. What foods are not recommended? The items listed may not be a complete list. Talk with your dietitian about what dietary choices are best for you. Grains Baked goods made with fat, such as croissants, muffins, or some breads. Dry pasta or rice meal packs. Vegetables Creamed or fried vegetables. Vegetables in a cheese sauce. Regular canned vegetables (not low-sodium or reduced-sodium). Regular canned tomato sauce and paste (not low-sodium or reduced-sodium). Regular tomato and vegetable juice (not low-sodium or reduced-sodium). Pickles. Olives. Fruits Canned fruit in a light or heavy syrup. Fried fruit. Fruit in cream or butter sauce. Meat and other protein foods Fatty cuts of meat. Ribs. Fried meat. Bacon. Sausage. Bologna and other processed lunch meats. Salami. Fatback. Hotdogs. Bratwurst. Salted nuts and seeds. Canned beans with added salt. Canned or smoked fish. Whole eggs or egg yolks. Chicken or turkey with skin. Dairy Whole or 2% milk, cream, and half-and-half. Whole or full-fat cream cheese. Whole-fat or sweetened yogurt. Full-fat cheese. Nondairy creamers. Whipped toppings.  Processed cheese and cheese spreads. Fats and oils Butter. Stick margarine. Lard. Shortening. Ghee. Bacon fat. Tropical oils, such as coconut, palm kernel, or palm oil. Seasoning and other foods Salted popcorn and pretzels. Onion salt, garlic salt, seasoned salt, table salt, and sea salt. Worcestershire sauce. Tartar sauce. Barbecue sauce. Teriyaki sauce. Soy sauce, including reduced-sodium. Steak sauce. Canned and packaged gravies. Fish sauce. Oyster sauce. Cocktail sauce. Horseradish that you find on the shelf. Ketchup. Mustard. Meat flavorings and tenderizers. Bouillon cubes. Hot sauce and Tabasco sauce. Premade or packaged marinades. Premade or packaged taco seasonings. Relishes. Regular salad dressings. Where to find more information:  National Heart, Lung, and Blood Institute: www.nhlbi.nih.gov  American Heart Association: www.heart.org Summary  The DASH eating plan is a healthy eating plan that has been shown to reduce high blood pressure (hypertension). It may also reduce your risk for type 2 diabetes, heart disease, and stroke.  With the DASH eating plan, you should limit salt (sodium) intake to 2,300 mg a day. If you have hypertension, you may need to reduce your sodium intake to 1,500 mg a day.  When on the DASH eating plan, aim to eat more fresh fruits and vegetables, whole grains, lean proteins, low-fat dairy, and heart-healthy fats.  Work with your health care provider or diet and nutrition specialist (dietitian) to adjust your eating plan to your individual   calorie needs. This information is not intended to replace advice given to you by your health care provider. Make sure you discuss any questions you have with your health care provider. Document Released: 08/10/2011 Document Revised: 08/14/2016 Document Reviewed: 08/14/2016 Elsevier Interactive Patient Education  2018 [employer] Inc.  

## 2017-12-04 NOTE — Progress Notes (Signed)
Subjective:    Patient ID: Pamela Mendez, female    DOB: Apr 02, 1966, 52 y.o.   MRN: [id number]  HPI Patient is here today to follow up on her chronic health issues.  This patient was seen today for chronic pain  The medication list was reviewed and updated.   -Compliance with medication: Yes  - Number patient states they take daily: 4 per day Q four hours  -when was the last dose patient took? Yesterday afternoon  The patient was advised the importance of maintaining medication and not using illegal substances with these.  Here for refills and follow up  The patient was educated that we can provide 3 monthly scripts for their medication, it is their responsibility to follow the instructions.  Side effects or complications from medications: No  Patient is aware that pain medications are meant to minimize the severity of the pain to allow their pain levels to improve to allow for better function. They are aware of that pain medications cannot totally remove their pain.  Due for UDT ( at least once per year) : 08//11/2016  Patient for blood pressure check up. Patient relates compliance with meds. Todays BP reviewed with the patient. Patient denies issues with medication. Patient relates reasonable diet. Patient tries to minimize salt. Patient aware of BP goals.  Patient here for follow-up regarding cholesterol.  Patient does try to maintain a reasonable diet.  Patient does take the medication on a regular basis.  Denies missing a dose.  The patient denies any obvious side effects.  Prior blood work results reviewed with the patient.  The patient is aware of his cholesterol goals and the need to keep it under good control to lessen the risk of disease.  Patient does have ongoing trouble with reflux.  Takes medication on a regular basis.  Tries to minimize foods as best they can.  They understand the importance of dietary compliance.  May also try to avoid eating a large meal close to  bedtime.  Patient denies any dysphagia denies hematochezia.  States medicine does a good job keeping the problem under good control.  Without the medication may certainly have issues.They desire to continue taking their medication.  Moderate allergies issue using zyrtec and flonase Relates a lot of head congestion sinus pressure no wheezing or difficulty breathing no high fevers chills or vomiting     Review of Systems  Constitutional: Negative for activity change, appetite change and fatigue.  HENT: Negative for congestion.   Respiratory: Negative for cough.   Cardiovascular: Negative for chest pain.  Gastrointestinal: Negative for abdominal pain.  Endocrine: Negative for polydipsia and polyphagia.  Skin: Negative for color change.  Neurological: Negative for weakness.  Psychiatric/Behavioral: Negative for confusion.       Objective:   Physical Exam  Constitutional: She appears well-nourished. No distress.  HENT:  Head: Normocephalic.  Right Ear: External ear normal.  Left Ear: External ear normal.  Eyes: Right eye exhibits no discharge. Left eye exhibits no discharge.  Neck: No tracheal deviation present.  Cardiovascular: Normal rate, regular rhythm and normal heart sounds.  No murmur heard. Pulmonary/Chest: Effort normal and breath sounds normal. No respiratory distress. She has no wheezes. She has no rales.  Musculoskeletal: She exhibits no edema.  Lymphadenopathy:    She has no cervical adenopathy.  Neurological: She is alert.  Psychiatric: Her behavior is normal.  Vitals reviewed.         Assessment & Plan:  Blood pressure good  control continue current measures HTN- Patient was seen today as part of a visit regarding hypertension. The importance of healthy diet and regular physical activity was discussed. The importance of compliance with medications discussed. Ideal goal is to keep blood pressure low elevated levels certainly below 140/90 when possible. The  patient was counseled that keeping blood pressure under control lessen his risk of heart attack, stroke, kidney failure, and early death. The importance of regular follow-ups was discussed with the patient. Low-salt diet such as DASH recommended. Regular physical activity was recommended as well. Patient was advised to keep regular follow-ups.  The patient was seen today as part of an evaluation regarding hyperlipidemia. Recent lab work has been reviewed with the patient as well as the goals for good cholesterol care. In addition to this medications have been discussed the importance of compliance with diet and medications discussed as well. Patient has been informed of potential side effects of medications in the importance to notify us should any problems occur. Finally the patient is aware that poor control of cholesterol, noncompliance can dramatically increase her risk of heart attack strokes and premature death. The patient will keep regular office visits and the patient does agreed to periodic lab work.  Reflux under good control with current medication continue current medication.  Patient gets severe heartburn if she stops it  Chronic low back pain and discomfort with radiation down the right leg continue gabapentin use pain medicine she is using it responsibly it helps keep her pain under fairly decent control  The patient was seen today as part of a comprehensive visit regarding pain control. Patient's compliance with the medication as well as discussion regarding effectiveness was completed. Prescriptions were written. Patient was advised to follow-up in 3 months. The patient was assessed for any signs of severe side effects. The patient was advised to take the medicine as directed and to report to [place] if any side effect issues. Drug registry was checked  Referral to GI for colon cancer screening  25 minutes was spent with the patient.  This statement verifies that 25 minutes was indeed spent  with the patient. Greater than half the time was spent in discussion, counseling and answering questions  regarding the issues that the patient came in for today as reflected in the diagnosis (s) please refer to documentation for further details. \Significant time was spent with the patient discussing her chronic back pain responsible use of pain medicines checking the drug registry also her blood pressure checking it with her cuff and our cuff plus also discussing lipid profile in colon cancer screening greater than 25 minutes spent with patient

## 2017-12-12 ENCOUNTER — Encounter (INDEPENDENT_AMBULATORY_CARE_PROVIDER_SITE_OTHER): Payer: Self-pay | Admitting: *Deleted

## 2017-12-13 ENCOUNTER — Other Ambulatory Visit: Payer: Self-pay | Admitting: Family Medicine

## 2017-12-13 DIAGNOSIS — Z1231 Encounter for screening mammogram for malignant neoplasm of breast: Secondary | ICD-10-CM

## 2017-12-14 ENCOUNTER — Encounter (HOSPITAL_COMMUNITY): Payer: Self-pay

## 2017-12-14 ENCOUNTER — Ambulatory Visit (HOSPITAL_COMMUNITY)
Admission: RE | Admit: 2017-12-14 | Discharge: 2017-12-14 | Disposition: A | Discharge status: Home / Self Care | Payer: Medicaid Other | Source: Ambulatory Visit | Attending: Family Medicine | Admitting: Family Medicine

## 2017-12-14 DIAGNOSIS — Z1231 Encounter for screening mammogram for malignant neoplasm of breast: Secondary | ICD-10-CM

## 2017-12-14 DIAGNOSIS — R928 Other abnormal and inconclusive findings on diagnostic imaging of breast: Secondary | ICD-10-CM | POA: Insufficient documentation

## 2017-12-17 ENCOUNTER — Other Ambulatory Visit: Payer: Self-pay | Admitting: Family Medicine

## 2017-12-17 DIAGNOSIS — R928 Other abnormal and inconclusive findings on diagnostic imaging of breast: Secondary | ICD-10-CM

## 2017-12-25 ENCOUNTER — Ambulatory Visit (HOSPITAL_COMMUNITY)
Admission: RE | Admit: 2017-12-25 | Discharge: 2017-12-25 | Disposition: A | Discharge status: Home / Self Care | Payer: Medicaid Other | Source: Ambulatory Visit | Attending: Family Medicine | Admitting: Family Medicine

## 2017-12-25 DIAGNOSIS — R928 Other abnormal and inconclusive findings on diagnostic imaging of breast: Secondary | ICD-10-CM | POA: Diagnosis present

## 2017-12-27 ENCOUNTER — Encounter: Payer: Self-pay | Admitting: Family Medicine

## 2017-12-27 ENCOUNTER — Ambulatory Visit: Payer: Medicaid Other | Admitting: Family Medicine

## 2017-12-27 VITALS — BP 142/88 | Temp 97.9°F | Ht 66.0 in | Wt 213.6 lb

## 2017-12-27 DIAGNOSIS — J019 Acute sinusitis, unspecified: Secondary | ICD-10-CM

## 2017-12-27 DIAGNOSIS — B9689 Other specified bacterial agents as the cause of diseases classified elsewhere: Secondary | ICD-10-CM | POA: Diagnosis not present

## 2017-12-27 MED ORDER — CLARITHROMYCIN 500 MG PO TABS: 500.0000 mg | ORAL_TABLET | Freq: Two times a day (BID) | ORAL | 0 refills | Status: DC

## 2017-12-27 MED ORDER — OLOPATADINE HCL 0.2 % OP SOLN: 1.0000 [drp] | Freq: Every evening | OPHTHALMIC | 4 refills | Status: DC | PRN

## 2017-12-27 NOTE — Progress Notes (Signed)
   Subjective:    Patient ID: Pamela Mendez, female    DOB: [birth year], 52 y.o.   MRN: [id number]  Sinusitis  This is a new problem. The current episode started in the past 7 days. Associated symptoms include ear pain, headaches and sinus pressure. (Does have a little blood when she blows her nose) Treatments tried: flonase  The treatment provided mild relief.   yest er day morn hit hard  Felt ok last week   Little stuffiness   Worse a behind right  Eye   Feels like sinus infxn   Blood sectretions  Non smoker     on c3eterizine and flonase   patafay helped and needds more      Review of Systems  HENT: Positive for ear pain and sinus pressure.   Neurological: Positive for headaches.       Objective:   Physical Exam  Alert, mild malaise. Hydration good Vitals stable. frontal/ maxillary tenderness evident positive nasal congestion. pharynx normal neck supple  lungs clear/no crackles or wheezes. heart regular in rhythm       Assessment & Plan:  Impression rhinosinusitis likely post viral, discussed with patient. plan antibiotics prescribed. Questions answered. Symptomatic care discussed. warning signs discussed. WSL  Seen after hours rather than sent to emergency room

## 2018-01-17 ENCOUNTER — Telehealth: Payer: Self-pay

## 2018-01-17 NOTE — Telephone Encounter (Signed)
Hydrocodone not approved. Please see the note in your folder. Thanks

## 2018-01-25 ENCOUNTER — Telehealth: Payer: Self-pay | Admitting: Family Medicine

## 2018-01-25 NOTE — Telephone Encounter (Signed)
Please reinitiate.  This could be done on Tuesday.  I will dictate a letter regarding medical necessity of this medication.  We will need to appeal this medication or go through the process again on getting this covered

## 2018-01-25 NOTE — Telephone Encounter (Signed)
I will dictate a letter of medical necessity for her pain medicine

## 2018-01-28 ENCOUNTER — Telehealth: Payer: Self-pay | Admitting: Family Medicine

## 2018-01-28 ENCOUNTER — Encounter: Payer: Self-pay | Admitting: Family Medicine

## 2018-01-28 NOTE — Telephone Encounter (Signed)
I refilled out her form.  There is also a letter regarding her treatment.  Please appeal their denial thank you

## 2018-01-29 NOTE — Telephone Encounter (Signed)
Letter for appeal faxed to [facility] Tr[facility] by medical records. Await response

## 2018-01-29 NOTE — Telephone Encounter (Addendum)
Letter for appeal faxed to [facility] Tracks by medical records.

## 2018-02-01 NOTE — Telephone Encounter (Signed)
Spoke with pharmacist at [facility] who states patient's med was covered by insurance in May.

## 2018-02-01 NOTE — Telephone Encounter (Signed)
Babs Sciara, MD  [facility] Clinical Pool 39 minutes ago (7:54 AM)      I spoke with Sherrill Buikema-she relates the letter as well as appeal was sent in.  Awaiting here from the patient's insurance regarding long-term coverage of pain medicine.  If this is rejected next step is to bring patient back in 4 visit specifically on pain and resubmit prescription-with a updated the pain management plan-if that fails next step would be referral to pain management-FYI for the nurses

## 2018-02-01 NOTE — Telephone Encounter (Signed)
I spoke with Pamela Mendez-she relates the letter as well as appeal was sent in.  Awaiting here from the patient's insurance regarding long-term coverage of pain medicine.  If this is rejected next step is to bring patient back in 4 visit specifically on pain and resubmit prescription-with a updated the pain management plan-if that fails next step would be referral to pain management-FYI for the nurses

## 2018-02-14 ENCOUNTER — Telehealth: Payer: Self-pay

## 2018-02-14 NOTE — Telephone Encounter (Signed)
Per [facility] pt paid cash on 02/06/2018 for her medications and we did get authorization on the Hydrocodone and she should be able to get it filled at next pick up. The pt is aware.

## 2018-03-05 ENCOUNTER — Ambulatory Visit: Payer: Medicaid Other | Admitting: Family Medicine

## 2018-03-05 ENCOUNTER — Encounter: Payer: Self-pay | Admitting: Family Medicine

## 2018-03-05 VITALS — BP 122/84 | Ht 66.0 in | Wt 218.6 lb

## 2018-03-05 DIAGNOSIS — M5440 Lumbago with sciatica, unspecified side: Secondary | ICD-10-CM | POA: Diagnosis not present

## 2018-03-05 DIAGNOSIS — G8929 Other chronic pain: Secondary | ICD-10-CM

## 2018-03-05 DIAGNOSIS — E782 Mixed hyperlipidemia: Secondary | ICD-10-CM

## 2018-03-05 DIAGNOSIS — I10 Essential (primary) hypertension: Secondary | ICD-10-CM

## 2018-03-05 DIAGNOSIS — J0101 Acute recurrent maxillary sinusitis: Secondary | ICD-10-CM | POA: Diagnosis not present

## 2018-03-05 MED ORDER — PRAVASTATIN SODIUM 40 MG PO TABS: 40.0000 mg | ORAL_TABLET | Freq: Every day | ORAL | 5 refills | Status: DC

## 2018-03-05 MED ORDER — TORSEMIDE 20 MG PO TABS: ORAL_TABLET | ORAL | 5 refills | Status: DC

## 2018-03-05 MED ORDER — CLARITHROMYCIN 500 MG PO TABS: 500.0000 mg | ORAL_TABLET | Freq: Two times a day (BID) | ORAL | 0 refills | Status: DC

## 2018-03-05 MED ORDER — HYDROCODONE-ACETAMINOPHEN 10-325 MG PO TABS: 1.0000 | ORAL_TABLET | ORAL | 0 refills | Status: DC | PRN

## 2018-03-05 MED ORDER — LISINOPRIL 20 MG PO TABS: 20.0000 mg | ORAL_TABLET | Freq: Every day | ORAL | 5 refills | Status: DC

## 2018-03-05 MED ORDER — POTASSIUM CHLORIDE CRYS ER 20 MEQ PO TBCR: EXTENDED_RELEASE_TABLET | ORAL | 5 refills | Status: DC

## 2018-03-05 MED ORDER — PANTOPRAZOLE SODIUM 40 MG PO TBEC: 40.0000 mg | DELAYED_RELEASE_TABLET | Freq: Every day | ORAL | 5 refills | Status: DC

## 2018-03-05 MED ORDER — GABAPENTIN 300 MG PO CAPS: ORAL_CAPSULE | ORAL | 12 refills | Status: DC

## 2018-03-05 NOTE — Progress Notes (Signed)
Subjective:    Patient ID: Pamela Mendez, female    DOB: 1965-10-06, 52 y.o.   MRN: [id number]  Sinusitis  This is a new problem. The current episode started in the past 7 days. The problem has been gradually worsening since onset. Her pain is at a severity of 5/10. The pain is moderate. Associated symptoms include coughing, headaches, sinus pressure, a sore throat and swollen glands. Pertinent negatives include no chills, congestion or shortness of breath. Past treatments include acetaminophen. The treatment provided mild relief.    This patient was seen today for chronic pain  The medication list was reviewed and updated.   -Compliance with medication: yes  - Number patient states they take daily: 4 a day  -when was the last dose patient took? yesterday  The patient was advised the importance of maintaining medication and not using illegal substances with these.  Here for refills and follow up  The patient was educated that we can provide 3 monthly scripts for their medication, it is their responsibility to follow the instructions.  Side effects or complications from medications: none  Patient is aware that pain medications are meant to minimize the severity of the pain to allow their pain levels to improve to allow for better function. They are aware of that pain medications cannot totally remove their pain.  Due for UDT ( at least once per year) : 04/2017  Sinus- began sunday   Patient for blood pressure check up.  The patient does have hypertension.  The patient is on medication.  Patient relates compliance with meds. Todays BP reviewed with the patient. Patient denies issues with medication. Patient relates reasonable diet. Patient tries to minimize salt. Patient aware of BP goals.  Patient here for follow-up regarding cholesterol.  The patient does have hyperlipidemia.  Patient does try to maintain a reasonable diet.  Patient does take the medication on a regular basis.   Denies missing a dose.  The patient denies any obvious side effects.  Prior blood work results reviewed with the patient.  The patient is aware of his cholesterol goals and the need to keep it under good control to lessen the risk of disease.   Review of Systems  Constitutional: Negative for activity change, appetite change, chills and fatigue.  HENT: Positive for sinus pressure and sore throat. Negative for congestion.   Respiratory: Positive for cough. Negative for shortness of breath.   Cardiovascular: Negative for chest pain.  Gastrointestinal: Negative for abdominal pain.  Endocrine: Negative for polydipsia and polyphagia.  Skin: Negative for color change.  Neurological: Positive for headaches. Negative for weakness.  Psychiatric/Behavioral: Negative for confusion.       Objective:   Physical Exam  Constitutional: She appears well-nourished. No distress.  Cardiovascular: Normal rate, regular rhythm and normal heart sounds.  No murmur heard. Pulmonary/Chest: Effort normal and breath sounds normal. No respiratory distress.  Musculoskeletal: She exhibits no edema.  Lymphadenopathy:    She has no cervical adenopathy.  Neurological: She is alert. She exhibits normal muscle tone.  Psychiatric: Her behavior is normal.  Vitals reviewed.         Assessment & Plan:  Patient was seen today for upper respiratory illness. It is felt that the patient is dealing with sinusitis.  Antibiotics were prescribed today. Importance of compliance with medication was discussed.  Symptoms should gradually resolve over the course of the next several days. If high fevers, progressive illness, difficulty breathing, worsening condition or failure for symptoms  to improve over the next several days then the patient is to follow-up.  If any emergent conditions the patient is to follow-up in the emergency department otherwise to follow-up in the office.  The patient was seen today as part of an evaluation  regarding hyperlipidemia.  Recent lab work has been reviewed with the patient as well as the goals for good cholesterol care.  In addition to this medications have been discussed the importance of compliance with diet and medications discussed as well.  Finally the patient is aware that poor control of cholesterol, noncompliance can dramatically increase the risk of complications. The patient will keep regular office visits and the patient does agreed to periodic lab work.  HTN- Patient was seen today as part of a visit regarding hypertension. The importance of healthy diet and regular physical activity was discussed. The importance of compliance with medications discussed.  Ideal goal is to keep blood pressure low elevated levels certainly below 140/90 when possible.  The patient was counseled that keeping blood pressure under control lessen his risk of complications.  The importance of regular follow-ups was discussed with the patient.  Low-salt diet such as DASH recommended.  Regular physical activity was recommended as well.  Patient was advised to keep regular follow-ups.  The patient was seen in followup for chronic pain. A review over at their current pain status was discussed. Drug registry was checked. Prescriptions were given. Discussion was held regarding the importance of compliance with medication as well as pain medication contract.  Time for questions regarding pain management plan occurred. Importance of regular followup visits was discussed. Patient was informed that medication may cause drowsiness and should not be combined  with other medications/alcohol or street drugs. Patient was cautioned that medication could cause drowsiness. If the patient feels medication is causing altered alertness then do not drive or operate dangerous equipment.  25 minutes was spent with the patient.  This statement verifies that 25 minutes was indeed spent with the patient.  More than 50% of  this visit-total duration of the visit-was spent in counseling and coordination of care. The issues that the patient came in for today as reflected in the diagnosis (s) please refer to documentation for further details.

## 2018-03-06 ENCOUNTER — Other Ambulatory Visit: Payer: Self-pay | Admitting: Family Medicine

## 2018-03-11 ENCOUNTER — Ambulatory Visit (HOSPITAL_COMMUNITY)
Admission: RE | Admit: 2018-03-11 | Discharge: 2018-03-11 | Disposition: A | Discharge status: Home / Self Care | Payer: Medicaid Other | Source: Ambulatory Visit | Attending: Nurse Practitioner | Admitting: Nurse Practitioner

## 2018-03-11 ENCOUNTER — Encounter: Payer: Self-pay | Admitting: Nurse Practitioner

## 2018-03-11 ENCOUNTER — Ambulatory Visit: Payer: Medicaid Other | Admitting: Nurse Practitioner

## 2018-03-11 ENCOUNTER — Telehealth: Payer: Self-pay | Admitting: Nurse Practitioner

## 2018-03-11 VITALS — BP 136/94 | Temp 98.3°F | Ht 66.0 in | Wt 218.8 lb

## 2018-03-11 DIAGNOSIS — M79671 Pain in right foot: Secondary | ICD-10-CM | POA: Diagnosis not present

## 2018-03-11 DIAGNOSIS — M7989 Other specified soft tissue disorders: Secondary | ICD-10-CM | POA: Insufficient documentation

## 2018-03-11 NOTE — Telephone Encounter (Signed)
Patient is calling to check on x-ray results. °

## 2018-03-11 NOTE — Telephone Encounter (Signed)
Pt aware carolyn is out of office and will return tomorrow

## 2018-03-11 NOTE — Progress Notes (Signed)
Subjective: Presents for complaints of right foot pain that began 2 days ago.  States she was just walking across the floor and felt a popping sensation with immediate swelling and pain after this.  No specific history of injury.  Has had difficulty with putting weight on the foot, slightly better today.  No history of risk factors for bone loss/osteoporosis.   Objective:   BP (!) 136/94   Temp 98.3 F (36.8 C) (Oral)   Ht 5\' 6"  (1.676 m)   Wt 218 lb 12.8 oz (99.2 kg)   BMI 35.32 kg/m  NAD.  Alert, oriented.  Lungs clear.  Heart regular rate and rhythm.  Generalized mild edema noted on the dorsal aspect of the right foot with minimal pink color, minimal warmth and extremely tender to palpation.  Can perform passive ROM of the right ankle with minimal tenderness, no joint laxity.  Distinct tenderness noted with movement of the toes.  Strong DP pulse.  Toes warm with normal capillary refill.  Assessment:  Right foot pain - Plan: DG Foot Complete Right    Plan: Restart naproxen as directed with food.  Discontinue if any stomach upset.  Ice applications and elevation.  Note that patient has been on pantoprazole long-term.  Further follow-up based on x-ray report.

## 2018-03-12 NOTE — Telephone Encounter (Signed)
Mychart message sent with results.

## 2018-03-15 ENCOUNTER — Other Ambulatory Visit: Payer: Self-pay | Admitting: Nurse Practitioner

## 2018-03-15 ENCOUNTER — Telehealth: Payer: Self-pay | Admitting: Nurse Practitioner

## 2018-03-15 DIAGNOSIS — M79671 Pain in right foot: Secondary | ICD-10-CM

## 2018-03-15 NOTE — Telephone Encounter (Signed)
Patient came by the office late Friday afternoon because she said she hasn't heard anything about her foot other than the nurses call from Monday.  I read the MyChart message to her that Eber Jones had sent her.  She said she was still having foot pain and would like a referral to the ortho doctor in [place] but prefers not to see Dr. Ophelia Charter.  Patient has Medicaid so will need a referral put in the system.

## 2018-03-15 NOTE — Telephone Encounter (Signed)
Done

## 2018-03-18 NOTE — Telephone Encounter (Signed)
Referral done

## 2018-03-27 ENCOUNTER — Encounter (INDEPENDENT_AMBULATORY_CARE_PROVIDER_SITE_OTHER): Payer: Self-pay | Admitting: Orthopaedic Surgery

## 2018-03-27 ENCOUNTER — Ambulatory Visit (INDEPENDENT_AMBULATORY_CARE_PROVIDER_SITE_OTHER): Payer: Medicaid Other | Admitting: Orthopaedic Surgery

## 2018-03-27 ENCOUNTER — Ambulatory Visit (INDEPENDENT_AMBULATORY_CARE_PROVIDER_SITE_OTHER): Payer: Medicaid Other

## 2018-03-27 VITALS — BP 157/100 | HR 81 | Ht 66.0 in | Wt 218.0 lb

## 2018-03-27 DIAGNOSIS — M79671 Pain in right foot: Secondary | ICD-10-CM | POA: Diagnosis not present

## 2018-03-27 NOTE — Progress Notes (Signed)
Office Visit Note   Patient: Pamela Mendez           Date of Birth: 05-17-1966           MRN: [id number] Visit Date: 03/27/2018              Requested by: Babs Sciara, MD [address] [place], [place] [address] PCP: Babs Sciara, MD   Assessment & Plan: Visit Diagnoses:  1. Right foot pain     Plan:  Follow-Up Instructions: Return if symptoms worsen or fail to improve.   Orders:  No orders of the defined types were placed in this encounter.  No orders of the defined types were placed in this encounter.     Procedures: No procedures performed   Clinical Data: No additional findings.   Subjective: No chief complaint on file. Pamela Mendez is 52 years old and visits the office for evaluation of right foot pain.  First noted onset acutely approximately 1 month ago when she was simply walking and felt a "pop".  She was seen by her primary care physician just over 2 weeks ago.  X-rays were negative.  Patient continues to complain of pain across the dorsum of her right foot in the area of the mid metatarsals.  She is had some swelling.  She is wearing regular shoes.  She does not work.  Does not re member any specific injury trauma or repetitive activity  HPI  Review of Systems   Objective: Vital Signs: There were no vitals taken for this visit.  Physical Exam  Constitutional: She is oriented to person, place, and time. She appears well-developed and well-nourished.  HENT:  Mouth/Throat: Oropharynx is clear and moist.  Eyes: Pupils are equal, round, and reactive to light. EOM are normal.  Pulmonary/Chest: Effort normal.  Neurological: She is alert and oriented to person, place, and time.  Skin: Skin is warm and dry.  Psychiatric: She has a normal mood and affect. Her behavior is normal.    Ortho Exam awake alert and oriented x3.  Comfortable sitting.  Right foot has some swelling in the midfoot.  Tenderness along the second third and fourth  metatarsals.  Asymptomatic bunion.  Toes not swollen.  Normal sensory and motor exam.  No ankle pain.  No plantar pain  Specialty Comments:  No specialty comments available.  Imaging: No results found.   PMFS History: Patient Active Problem List   Diagnosis Date Noted  . HTN (hypertension) 12/08/2016  . Sinusitis 06/27/2016  . Sciatica of right side 01/04/2016  . Hypokalemia 10/07/2015  . Migraine headache without aura 03/31/2015  . GERD (gastroesophageal reflux disease) 02/04/2015  . Chronic back pain 05/13/2013  . Allergic rhinitis 12/31/2012  . EDEMA 11/09/2010  . Hyperlipidemia 01/17/2010  . DEPRESSION/ANXIETY 01/17/2010  . CARDIOMYOPATHY, MILD 01/17/2010  . ABDOMINAL PAIN, UNSPECIFIED SITE 01/17/2010   Past Medical History:  Diagnosis Date  . Abdominal pain    Unspecified site  . Anxiety   . Anxiety and depression   . 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 CHF (congestive heart failure) (HCC) 2004   post-partum  . Chronic back pain   . Chronic left hip pain   . Depression   . Gastritis   . GERD (gastroesophageal reflux disease)   . H/O echocardiogram 2012   EF 55-60%  . Hyperlipidemia   . Hypertension   . Migraine headache without aura   . Vertigo     Family  History  Problem Relation Age of Onset  . Heart attack Father   . Anesthesia problems Neg Hx   . Hypotension Neg Hx   . Malignant hyperthermia Neg Hx   . Pseudochol deficiency Neg Hx     Past Surgical History:  Procedure Laterality Date  . ABLATION    . Childbirth     time 3  . CHOLECYSTECTOMY  [address] , Dr Katrinka Blazing  . LUMBAR DISC SURGERY  2010   [facility], Dr Marikay Alar  . LUMBAR DISC SURGERY  2005   [place], Dr Marikay Alar   Social History   Occupational History  . Not on file  Tobacco Use  . Smoking status: Former Smoker    Packs/day: 0.00    Years: 15.00    Pack years: 0.00    Types: Cigarettes    Last attempt to quit: 08/23/2013    Years since quitting: 4.5    . Smokeless tobacco: Never Used  Substance and Sexual Activity  . Alcohol use: No  . Drug use: No  . Sexual activity: Yes    Birth control/protection: Surgical    Comment: ablation     Valeria Batman, MD   Note - This record has been created using [employer].  Chart creation errors have been sought, but may not always  have been located. Such creation errors do not reflect on  the standard of medical care.

## 2018-04-04 ENCOUNTER — Other Ambulatory Visit: Payer: Self-pay | Admitting: Family Medicine

## 2018-04-04 ENCOUNTER — Other Ambulatory Visit: Payer: Self-pay | Admitting: Nurse Practitioner

## 2018-05-07 ENCOUNTER — Telehealth: Payer: Self-pay | Admitting: Family Medicine

## 2018-05-07 MED ORDER — NITROFURANTOIN MONOHYD MACRO 100 MG PO CAPS: 100.0000 mg | ORAL_CAPSULE | Freq: Two times a day (BID) | ORAL | 0 refills | Status: DC

## 2018-05-07 NOTE — Telephone Encounter (Signed)
Macrobid 100 mg 1 twice daily for 5 days

## 2018-05-07 NOTE — Telephone Encounter (Signed)
Pt thinks she has a kidney infection. She goes to the bathroom and is only able to go a little. She has a lot of pressure on her bladder. If something can be called in please send to [facility] - [place], [place] - [address]

## 2018-05-07 NOTE — Telephone Encounter (Signed)
Patient having urinary frequency and pressure- can only go a little at a time and no available appointments to day and would like to know if something can be called in

## 2018-05-07 NOTE — Telephone Encounter (Signed)
Med sent to pharm. Pt notified.  

## 2018-05-09 ENCOUNTER — Other Ambulatory Visit: Payer: Self-pay | Admitting: Family Medicine

## 2018-05-09 ENCOUNTER — Telehealth: Payer: Self-pay | Admitting: Family Medicine

## 2018-05-09 MED ORDER — HYDROCODONE-ACETAMINOPHEN 10-325 MG PO TABS: 1.0000 | ORAL_TABLET | ORAL | 0 refills | Status: DC | PRN

## 2018-05-09 NOTE — Telephone Encounter (Signed)
She may have this +3 refills 

## 2018-05-09 NOTE — Telephone Encounter (Signed)
Patient came in today she had misplaced her prescription and try to get a old prescription filled I checked the drug registry we went ahead and send in a month supply to her pharmacy she will follow-up here October 3

## 2018-06-06 ENCOUNTER — Ambulatory Visit: Payer: Medicaid Other | Admitting: Family Medicine

## 2018-06-06 ENCOUNTER — Encounter: Payer: Self-pay | Admitting: Family Medicine

## 2018-06-06 VITALS — BP 132/90 | Ht 66.0 in | Wt 217.6 lb

## 2018-06-06 DIAGNOSIS — M5440 Lumbago with sciatica, unspecified side: Secondary | ICD-10-CM | POA: Diagnosis not present

## 2018-06-06 DIAGNOSIS — E782 Mixed hyperlipidemia: Secondary | ICD-10-CM | POA: Diagnosis not present

## 2018-06-06 DIAGNOSIS — I10 Essential (primary) hypertension: Secondary | ICD-10-CM

## 2018-06-06 DIAGNOSIS — Z79891 Long term (current) use of opiate analgesic: Secondary | ICD-10-CM | POA: Diagnosis not present

## 2018-06-06 DIAGNOSIS — G8929 Other chronic pain: Secondary | ICD-10-CM | POA: Diagnosis not present

## 2018-06-06 MED ORDER — AMLODIPINE BESYLATE 2.5 MG PO TABS: 2.5000 mg | ORAL_TABLET | Freq: Every day | ORAL | 5 refills | Status: DC

## 2018-06-06 MED ORDER — PRAVASTATIN SODIUM 40 MG PO TABS: 40.0000 mg | ORAL_TABLET | Freq: Every day | ORAL | 5 refills | Status: DC

## 2018-06-06 MED ORDER — HYDROCODONE-ACETAMINOPHEN 10-325 MG PO TABS: ORAL_TABLET | ORAL | 0 refills | Status: DC

## 2018-06-06 MED ORDER — FLUTICASONE PROPIONATE 50 MCG/ACT NA SUSP: 2.0000 | Freq: Every day | NASAL | 5 refills | Status: DC

## 2018-06-06 MED ORDER — ALBUTEROL SULFATE HFA 108 (90 BASE) MCG/ACT IN AERS: 2.0000 | INHALATION_SPRAY | Freq: Four times a day (QID) | RESPIRATORY_TRACT | 2 refills | Status: DC | PRN

## 2018-06-06 MED ORDER — CETIRIZINE HCL 10 MG PO TABS: ORAL_TABLET | ORAL | 5 refills | Status: DC

## 2018-06-06 MED ORDER — PANTOPRAZOLE SODIUM 40 MG PO TBEC: 40.0000 mg | DELAYED_RELEASE_TABLET | Freq: Every day | ORAL | 5 refills | Status: DC

## 2018-06-06 MED ORDER — LISINOPRIL 20 MG PO TABS: 20.0000 mg | ORAL_TABLET | Freq: Every day | ORAL | 5 refills | Status: DC

## 2018-06-06 MED ORDER — METHOCARBAMOL 500 MG PO TABS: ORAL_TABLET | ORAL | 6 refills | Status: DC

## 2018-06-06 MED ORDER — POTASSIUM CHLORIDE CRYS ER 20 MEQ PO TBCR: EXTENDED_RELEASE_TABLET | ORAL | 5 refills | Status: DC

## 2018-06-06 MED ORDER — TORSEMIDE 20 MG PO TABS: ORAL_TABLET | ORAL | 5 refills | Status: DC

## 2018-06-06 MED ORDER — GABAPENTIN 300 MG PO CAPS: ORAL_CAPSULE | ORAL | 6 refills | Status: DC

## 2018-06-06 NOTE — Progress Notes (Signed)
Subjective:    Patient ID: Pamela Mendez, female    DOB: [birth year], 52 y.o.   MRN: [id number]  HPI This patient was seen today for chronic pain  The medication list was reviewed and updated.   -Compliance with medication: yes  - Number patient states they take daily: 4  -when was the last dose patient took? Last night  The patient was advised the importance of maintaining medication and not using illegal substances with these.  Here for refills and follow up  The patient was educated that we can provide 3 monthly scripts for their medication, it is their responsibility to follow the instructions.  Side effects or complications from medications: none  Patient is aware that pain medications are meant to minimize the severity of the pain to allow their pain levels to improve to allow for better function. They are aware of that pain medications cannot totally remove their pain.  Due for UDT ( at least once per year) : done today  Pt states that her BP has been elevated recently. Pt states that when she gets in pain, her BP increases.     Patient does try to minimize salt use Review of Systems  Constitutional: Negative for activity change, appetite change and fatigue.  HENT: Negative for congestion and rhinorrhea.   Respiratory: Negative for cough and shortness of breath.   Cardiovascular: Negative for chest pain and leg swelling.  Gastrointestinal: Negative for abdominal pain and diarrhea.  Endocrine: Negative for polydipsia and polyphagia.  Skin: Negative for color change.  Neurological: Negative for dizziness and weakness.  Psychiatric/Behavioral: Negative for behavioral problems and confusion.       Objective:   Physical Exam  Constitutional: She appears well-nourished. No distress.  HENT:  Head: Normocephalic and atraumatic.  Eyes: Right eye exhibits no discharge. Left eye exhibits no discharge.  Neck: No tracheal deviation present.  Cardiovascular: Normal rate,  regular rhythm and normal heart sounds.  No murmur heard. Pulmonary/Chest: Effort normal and breath sounds normal. No respiratory distress.  Musculoskeletal: She exhibits no edema.  Lymphadenopathy:    She has no cervical adenopathy.  Neurological: She is alert. Coordination normal.  Skin: Skin is warm and dry.  Psychiatric: She has a normal mood and affect. Her behavior is normal.  Vitals reviewed.         Assessment & Plan:  The patient was seen in followup for chronic pain. A review over at their current pain status was discussed. Drug registry was checked. Prescriptions were given. Discussion was held regarding the importance of compliance with medication as well as pain medication contract.  Time for questions regarding pain management plan occurred. Importance of regular followup visits was discussed. Patient was informed that medication may cause drowsiness and should not be combined  with other medications/alcohol or street drugs. Patient was cautioned that medication could cause drowsiness. If the patient feels medication is causing altered alertness then do not drive or operate dangerous equipment.  Medication is benefiting her continue this  HTN- Patient was seen today as part of a visit regarding hypertension. The importance of healthy diet and regular physical activity was discussed. The importance of compliance with medications discussed.  Ideal goal is to keep blood pressure low elevated levels certainly below 140/90 when possible.  The patient was counseled that keeping blood pressure under control lessen his risk of complications.  The importance of regular follow-ups was discussed with the patient.  Low-salt diet such as DASH recommended.  Regular  physical activity was recommended as well.  Patient was advised to keep regular follow-ups. Subpar control of blood pressure add amlodipine 2.5 mg daily  The patient was seen today as part of an evaluation regarding  hyperlipidemia.  Recent lab work has been reviewed with the patient as well as the goals for good cholesterol care.  In addition to this medications have been discussed the importance of compliance with diet and medications discussed as well.  Finally the patient is aware that poor control of cholesterol, noncompliance can dramatically increase the risk of complications. The patient will keep regular office visits and the patient does agreed to periodic lab work. Patient does use muscle relaxer one in the evening time to help with back spasms denies it causing drowsiness

## 2018-06-06 NOTE — Patient Instructions (Signed)
Do your labs in early December  You have had labs ordered as part of today's visit. These test are necessary for [place] to provide good care. Failure to complete the labs in a timely basis can cause significant risk to your health. It is your responsibility to do the test. These orders are good for 6 months after 6 months they are cancelled by the system and require new orders. Labs are drawn at [facility], located down the hall on the way to the parking area.  .Please do your test as ordered!  All results are forwarded to you via my chart or mail. We will utilize a phone call for urgent findings. Letters typically take 7 to 14 days for you to receive after we receive results.  If you don't receive results within 2 weeks of doing your labs then  please call us for the results.  In the future,for chronic visits we encourage you to have labs ordered and completed a few days before your visit so we can have a detailed discussion about the results. Doing labs for chronic health issues ( thyroid,diabetes, cholesterol,etc.) before your visit allows for you to have a more thorough and efficient visit.  Lab orders can be given by our office to you by request.

## 2018-06-08 ENCOUNTER — Encounter (HOSPITAL_COMMUNITY): Payer: Self-pay | Admitting: Emergency Medicine

## 2018-06-08 ENCOUNTER — Other Ambulatory Visit: Payer: Self-pay

## 2018-06-08 ENCOUNTER — Emergency Department (HOSPITAL_COMMUNITY)
Admission: EM | Admit: 2018-06-08 | Discharge: 2018-06-08 | Disposition: A | Discharge status: Home / Self Care | Payer: Medicaid Other | Attending: Emergency Medicine | Admitting: Emergency Medicine

## 2018-06-08 DIAGNOSIS — R51 Headache: Secondary | ICD-10-CM | POA: Diagnosis present

## 2018-06-08 DIAGNOSIS — Z79899 Other long term (current) drug therapy: Secondary | ICD-10-CM | POA: Diagnosis not present

## 2018-06-08 DIAGNOSIS — Z87891 Personal history of nicotine dependence: Secondary | ICD-10-CM | POA: Insufficient documentation

## 2018-06-08 DIAGNOSIS — I509 Heart failure, unspecified: Secondary | ICD-10-CM | POA: Insufficient documentation

## 2018-06-08 DIAGNOSIS — I10 Essential (primary) hypertension: Secondary | ICD-10-CM | POA: Insufficient documentation

## 2018-06-08 LAB — BASIC METABOLIC PANEL
Anion gap: 7 (ref 5–15)
BUN: 12 mg/dL (ref 6–20)
CALCIUM: 8.9 mg/dL (ref 8.9–10.3)
CHLORIDE: 111 mmol/L (ref 98–111)
CO2: 24 mmol/L (ref 22–32)
Creatinine, Ser: 0.81 mg/dL (ref 0.44–1.00)
GFR calc Af Amer: >60 mL/min (ref >60)
GFR calc non Af Amer: >60 mL/min (ref >60)
GLUCOSE: 128 mg/dL — AB (ref 70–99)
Potassium: 3.6 mmol/L (ref 3.5–5.1)
Sodium: 142 mmol/L (ref 135–145)

## 2018-06-08 LAB — CBC
HEMATOCRIT: 46.4 % — AB (ref 36.0–46.0)
HEMOGLOBIN: 15.5 g/dL — AB (ref 12.0–15.0)
MCH: 29.9 pg (ref 26.0–34.0)
MCHC: 33.4 g/dL (ref 30.0–36.0)
MCV: 89.4 fL (ref 78.0–100.0)
Platelets: 232 10*3/uL (ref 150–400)
RBC: 5.19 MIL/uL — ABNORMAL HIGH (ref 3.87–5.11)
RDW: 13.3 % (ref 11.5–15.5)
WBC: 9.7 10*3/uL (ref 4.0–10.5)

## 2018-06-08 MED ORDER — ACETAMINOPHEN 500 MG PO TABS
1000.0000 mg | ORAL_TABLET | Freq: Once | ORAL | Status: AC
  Administered 2018-06-08: 1000 mg via ORAL
  Filled 2018-06-08: qty 2

## 2018-06-08 MED ORDER — LISINOPRIL 10 MG PO TABS
10.0000 mg | ORAL_TABLET | Freq: Once | ORAL | Status: AC
  Administered 2018-06-08: 10 mg via ORAL
  Filled 2018-06-08: qty 1

## 2018-06-08 MED ORDER — AMLODIPINE BESYLATE 5 MG PO TABS
5.0000 mg | ORAL_TABLET | Freq: Once | ORAL | Status: AC
  Administered 2018-06-08: 5 mg via ORAL
  Filled 2018-06-08: qty 1

## 2018-06-08 NOTE — ED Triage Notes (Signed)
Patient c/o hypertension with blood pressure of 189/109 approx 30 minutes ago. Patient states that she checks her blood pressure daily because of CHF. Patient recently started an additional blood pressure medication Thursday by PCP due to continuous elevateion in blood pressure. Patient states she woke with headache this morning and some dizziness. Patient states dizziness worse with movement -states "feels like I'm off balance." Denies any blurred vision, weakness on a certain side, slurred speech, or facial drooping.

## 2018-06-08 NOTE — Discharge Instructions (Addendum)
It was our pleasure to provide your ER care today - we hope that you feel better.  Increase your amlodipine dose from 2.5 mg a day to 5 mg a day.  Limit salt intake.   Follow up with your doctor this coming week for recheck of blood pressure.  Return to ER if worse, new symptoms, trouble breathing, chest pain, other concern.

## 2018-06-08 NOTE — ED Provider Notes (Signed)
[facility] EMERGENCY DEPARTMENT Provider Note   CSN: [id number] Arrival date & time: 06/08/18  1550     History   Chief Complaint Chief Complaint  Patient presents with  . Hypertension    HPI LITZY Mendez is a 52 y.o. female.  Patient w hx htn, c/o checking bp today at home and it was high. States recently had amlodipine 2.5 mg added by pcp as bp has been trending high. Gradual mild, dull headache, gradual onset today, c/w prior headaches. No eye pain or change in vision. No neck pain or stiffness. No abrupt or thunderclap type pain. No chest pain. No sob. No swelling. No numbness/weakness or change in normal/baseline functional ability. States compliant w home meds.   The history is provided by the patient.  Hypertension  Associated symptoms include headaches. Pertinent negatives include no chest pain, no abdominal pain and no shortness of breath.    Past Medical History:  Diagnosis Date  . Abdominal pain    Unspecified site  . Anxiety   . Anxiety and depression   . 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 CHF (congestive heart failure) (HCC) 2004   post-partum  . Chronic back pain   . Chronic left hip pain   . Depression   . Gastritis   . GERD (gastroesophageal reflux disease)   . H/O echocardiogram 2012   EF 55-60%  . Hyperlipidemia   . Hypertension   . Migraine headache without aura   . Vertigo     Patient Active Problem List   Diagnosis Date Noted  . HTN (hypertension) 12/08/2016  . Sinusitis 06/27/2016  . Sciatica of right side 01/04/2016  . Hypokalemia 10/07/2015  . Migraine headache without aura 03/31/2015  . GERD (gastroesophageal reflux disease) 02/04/2015  . Chronic back pain 05/13/2013  . Allergic rhinitis 12/31/2012  . EDEMA 11/09/2010  . Hyperlipidemia 01/17/2010  . DEPRESSION/ANXIETY 01/17/2010  . CARDIOMYOPATHY, MILD 01/17/2010  . ABDOMINAL PAIN, UNSPECIFIED SITE 01/17/2010    Past Surgical History:  Procedure Laterality Date  .  ABLATION    . Childbirth     time 3  . CHOLECYSTECTOMY  [address] , Dr Katrinka Blazing  . LUMBAR DISC SURGERY  2010   [facility], Dr Marikay Alar  . LUMBAR DISC SURGERY  2005   [place], Dr Marikay Alar     OB History    Gravida  3   Para  3   Term  3   Preterm      AB      Living  3     SAB      TAB      Ectopic      Multiple      Live Births               Home Medications    Prior to Admission medications   Medication Sig Start Date End Date Taking? Authorizing Provider  albuterol (PROVENTIL HFA;VENTOLIN HFA) 108 (90 Base) MCG/ACT inhaler Inhale 2 puffs into the lungs every 6 (six) hours as needed for wheezing. 06/06/18   Babs Sciara, MD  amLODipine (NORVASC) 2.5 MG tablet Take 1 tablet (2.5 mg total) by mouth daily. 06/06/18   Babs Sciara, MD  cetirizine (ZYRTEC) 10 MG tablet TAKE ONE TABLET BY MOUTH AT BEDTIME AS NEEDED FOR ALLERGIES. 06/06/18   Babs Sciara, MD  fluticasone (FLONASE) 50 MCG/ACT nasal spray Place 2 sprays into both nostrils daily. 06/06/18  Babs Sciara, MD  gabapentin (NEURONTIN) 300 MG capsule TAKE 2 CAPSULES BY MOUTH IN THE MORNING, 1 CAPSULE IN THE AFTERNOON, 2 CAPSULES IN THE EVENING. 06/06/18   Babs Sciara, MD  HYDROcodone-acetaminophen [facility]) 10-325 MG tablet One qid prn 06/06/18   Babs Sciara, MD  HYDROcodone-acetaminophen [facility]) 10-325 MG tablet One qid prn 06/06/18   Babs Sciara, MD  HYDROcodone-acetaminophen [facility]) 10-325 MG tablet One qid prn 06/06/18   Luking, Jonna Coup, MD  lisinopril (PRINIVIL,ZESTRIL) 20 MG tablet Take 1 tablet (20 mg total) by mouth daily. 06/06/18   Babs Sciara, MD  methocarbamol (ROBAXIN) 500 MG tablet TAKE 1 TABLET BY MOUTH EVERY 6 HOURS AS NEEDED FOR PAIN/MUSCLE SPASMS. 06/06/18   Babs Sciara, MD  Multiple Vitamin (MULTIVITAMIN WITH MINERALS) TABS tablet Take 1 tablet by mouth daily.    [provider]  naproxen (NAPROSYN) 500 MG tablet TAKE (1) TABLET BY MOUTH TWICE  DAILY WITH A MEAL. 12/04/17   Babs Sciara, MD  Olopatadine HCl 0.2 % SOLN Apply 1 drop to eye at bedtime as needed. 12/27/17   Merlyn Albert, MD  pantoprazole (PROTONIX) 40 MG tablet Take 1 tablet (40 mg total) by mouth daily. 06/06/18   Babs Sciara, MD  potassium chloride SA (KLOR-CON M20) 20 MEQ tablet Take 2 tablets qam 2 tablets at noon and 2 in the evening. 06/06/18   Babs Sciara, MD  pravastatin (PRAVACHOL) 40 MG tablet Take 1 tablet (40 mg total) by mouth daily. 06/06/18   Babs Sciara, MD  torsemide (DEMADEX) 20 MG tablet TAKE THREE TABLETS BY MOUTH IN THE MORNING AND TWO AT NOON 06/06/18   Babs Sciara, MD    Family History Family History  Problem Relation Age of Onset  . Heart attack Father   . Anesthesia problems Neg Hx   . Hypotension Neg Hx   . Malignant hyperthermia Neg Hx   . Pseudochol deficiency Neg Hx     Social History Social History   Tobacco Use  . Smoking status: Former Smoker    Packs/day: 0.00    Years: 15.00    Pack years: 0.00    Types: Cigarettes    Last attempt to quit: 08/23/2013    Years since quitting: 4.7  . Smokeless tobacco: Never Used  Substance Use Topics  . Alcohol use: No  . Drug use: No     Allergies   Cefzil [cefprozil]; Sulfa antibiotics; Augmentin [amoxicillin-pot clavulanate]; and Avelox [moxifloxacin hcl in nacl]   Review of Systems Review of Systems  Constitutional: Negative for fever.  HENT: Negative for sore throat.   Eyes: Negative for pain and visual disturbance.  Respiratory: Negative for cough and shortness of breath.   Cardiovascular: Negative for chest pain.  Gastrointestinal: Negative for abdominal pain and vomiting.  Genitourinary: Negative for flank pain.  Musculoskeletal: Negative for neck pain and neck stiffness.  Skin: Negative for rash.  Neurological: Positive for headaches. Negative for speech difficulty, weakness and numbness.  Hematological: Does not bruise/bleed easily.    Psychiatric/Behavioral: Negative for confusion.     Physical Exam Updated Vital Signs BP (!) 171/101 (BP Location: Right Arm)   Pulse 85   Temp (!) 97.5 F (36.4 C) (Oral)   Resp 18   Ht 1.676 m (5\' 6" )   Wt 96.2 kg   SpO2 97%   BMI 34.22 kg/m   Physical Exam  Constitutional: She appears well-developed and well-nourished.  HENT:  Mouth/Throat:  Oropharynx is clear and moist.  No sinus or temporal tenderness.   Eyes: Pupils are equal, round, and reactive to light. Conjunctivae are normal. No scleral icterus.  Neck: Neck supple. No tracheal deviation present.  No stiffness or rigidity.   Cardiovascular: Normal rate, regular rhythm, normal heart sounds and intact distal pulses. Exam reveals no gallop and no friction rub.  No murmur heard. Pulmonary/Chest: Effort normal and breath sounds normal. No respiratory distress.  Abdominal: Soft. Normal appearance. She exhibits no distension. There is no tenderness.  Musculoskeletal: She exhibits no edema or tenderness.  Neurological: She is alert.  Speech clear/fluent. Motor/sens grossly intact bil. No pronator drift. Steady gait.   Skin: Skin is warm and dry. No rash noted.  Psychiatric: She has a normal mood and affect.  Nursing note and vitals reviewed.    ED Treatments / Results  Labs (all labs ordered are listed, but only abnormal results are displayed) Results for orders placed or performed during the hospital encounter of 06/08/18  CBC  Result Value Ref Range   WBC 9.7 4.0 - 10.5 K/uL   RBC 5.19 (H) 3.87 - 5.11 MIL/uL   Hemoglobin 15.5 (H) 12.0 - 15.0 g/dL   HCT 16.1 (H) 09.6 - 04.5 %   MCV 89.4 78.0 - 100.0 fL   MCH 29.9 26.0 - 34.0 pg   MCHC 33.4 30.0 - 36.0 g/dL   RDW 40.9 81.1 - 91.4 %   Platelets 232 150 - 400 K/uL  Basic metabolic panel  Result Value Ref Range   Sodium 142 135 - 145 mmol/L   Potassium 3.6 3.5 - 5.1 mmol/L   Chloride 111 98 - 111 mmol/L   CO2 24 22 - 32 mmol/L   Glucose, Bld 128 (H) 70 - 99  mg/dL   BUN 12 6 - 20 mg/dL   Creatinine, Ser 7.82 0.44 - 1.00 mg/dL   Calcium 8.9 8.9 - 95.6 mg/dL   GFR calc non Af Amer >60 >60 mL/min   GFR calc Af Amer >60 >60 mL/min   Anion gap 7 5 - 15    EKG None  Radiology No results found.  Procedures Procedures (including critical care time)  Medications Ordered in ED Medications  acetaminophen (TYLENOL) tablet 1,000 mg (has no administration in time range)  amLODipine (NORVASC) tablet 5 mg (has no administration in time range)  lisinopril (PRINIVIL,ZESTRIL) tablet 10 mg (has no administration in time range)     Initial Impression / Assessment and Plan / ED Course  I have reviewed the triage vital signs and the nursing notes.  Pertinent labs & imaging results that were available during my care of the patient were reviewed by me and considered in my medical decision making (see chart for details).  Labs sent.   bp is elevated in ED.   Will give pt extra dose of home bp meds as on relatively low dose at home. Lisinopril 10 mg po, amlodipine 5 mg po.   Po fluids.   Acetaminophen po.  Reviewed nursing notes and prior charts for additional history.   Recheck pt improved from prior.   Pt appears comfortable and stable for d/c.     Final Clinical Impressions(s) / ED Diagnoses   Final diagnoses:  None    ED Discharge Orders    None       Cathren Laine, MD 06/08/18 1900

## 2018-06-09 ENCOUNTER — Telehealth: Payer: Self-pay | Admitting: Family Medicine

## 2018-06-09 NOTE — Telephone Encounter (Signed)
Patient went to the ER the weekend because of elevated blood pressure.  They increased her medication while she was at the ER.  Please find out how she is doing now with her blood pressures.  If they are still elevated she will need a follow-up office visit this week and to bring her medication list or bottles along with blood pressure cuff

## 2018-06-10 ENCOUNTER — Encounter: Payer: Self-pay | Admitting: Family Medicine

## 2018-06-10 ENCOUNTER — Ambulatory Visit: Payer: Medicaid Other | Admitting: Family Medicine

## 2018-06-10 VITALS — BP 122/74 | Temp 98.5°F | Ht 66.0 in | Wt 212.0 lb

## 2018-06-10 DIAGNOSIS — M5481 Occipital neuralgia: Secondary | ICD-10-CM | POA: Diagnosis not present

## 2018-06-10 DIAGNOSIS — J019 Acute sinusitis, unspecified: Secondary | ICD-10-CM | POA: Diagnosis not present

## 2018-06-10 DIAGNOSIS — I10 Essential (primary) hypertension: Secondary | ICD-10-CM

## 2018-06-10 MED ORDER — NAPROXEN 500 MG PO TABS: ORAL_TABLET | ORAL | 3 refills | Status: DC

## 2018-06-10 MED ORDER — DOXYCYCLINE HYCLATE 100 MG PO TABS: 100.0000 mg | ORAL_TABLET | Freq: Two times a day (BID) | ORAL | 0 refills | Status: DC

## 2018-06-10 MED ORDER — AMLODIPINE BESYLATE 5 MG PO TABS: 5.0000 mg | ORAL_TABLET | Freq: Every day | ORAL | 5 refills | Status: DC

## 2018-06-10 NOTE — Patient Instructions (Signed)
DASH Eating Plan DASH stands for "Dietary Approaches to Stop Hypertension." The DASH eating plan is a healthy eating plan that has been shown to reduce high blood pressure (hypertension). It may also reduce your risk for type 2 diabetes, heart disease, and stroke. The DASH eating plan may also help with weight loss. What are tips for following this plan? General guidelines  Avoid eating more than 2,300 mg (milligrams) of salt (sodium) a day. If you have hypertension, you may need to reduce your sodium intake to 1,500 mg a day.  Limit alcohol intake to no more than 1 drink a day for nonpregnant women and 2 drinks a day for men. One drink equals 12 oz of beer, 5 oz of wine, or 1 oz of hard liquor.  Work with your health care provider to maintain a healthy body weight or to lose weight. Ask what an ideal weight is for you.  Get at least 30 minutes of exercise that causes your heart to beat faster (aerobic exercise) most days of the week. Activities may include walking, swimming, or biking.  Work with your health care provider or diet and nutrition specialist (dietitian) to adjust your eating plan to your individual calorie needs. Reading food labels  Check food labels for the amount of sodium per serving. Choose foods with less than 5 percent of the Daily Value of sodium. Generally, foods with less than 300 mg of sodium per serving fit into this eating plan.  To find whole grains, look for the word "whole" as the first word in the ingredient list. Shopping  Buy products labeled as "low-sodium" or "no salt added."  Buy fresh foods. Avoid canned foods and premade or frozen meals. Cooking  Avoid adding salt when cooking. Use salt-free seasonings or herbs instead of table salt or sea salt. Check with your health care provider or pharmacist before using salt substitutes.  Do not fry foods. Cook foods using healthy methods such as baking, boiling, grilling, and broiling instead.  Cook with  heart-healthy oils, such as olive, canola, soybean, or sunflower oil. Meal planning   Eat a balanced diet that includes: ? 5 or more servings of fruits and vegetables each day. At each meal, try to fill half of your plate with fruits and vegetables. ? Up to 6-8 servings of whole grains each day. ? Less than 6 oz of lean meat, poultry, or fish each day. A 3-oz serving of meat is about the same size as a deck of cards. One egg equals 1 oz. ? 2 servings of low-fat dairy each day. ? A serving of nuts, seeds, or beans 5 times each week. ? Heart-healthy fats. Healthy fats called Omega-3 fatty acids are found in foods such as flaxseeds and coldwater fish, like sardines, salmon, and mackerel.  Limit how much you eat of the following: ? Canned or prepackaged foods. ? Food that is high in trans fat, such as fried foods. ? Food that is high in saturated fat, such as fatty meat. ? Sweets, desserts, sugary drinks, and other foods with added sugar. ? Full-fat dairy products.  Do not salt foods before eating.  Try to eat at least 2 vegetarian meals each week.  Eat more home-cooked food and less restaurant, buffet, and fast food.  When eating at a restaurant, ask that your food be prepared with less salt or no salt, if possible. What foods are recommended? The items listed may not be a complete list. Talk with your dietitian about what   dietary choices are best for you. Grains Whole-grain or whole-wheat bread. Whole-grain or whole-wheat pasta. Brown rice. Oatmeal. Quinoa. Bulgur. Whole-grain and low-sodium cereals. Pita bread. Low-fat, low-sodium crackers. Whole-wheat flour tortillas. Vegetables Fresh or frozen vegetables (raw, steamed, roasted, or grilled). Low-sodium or reduced-sodium tomato and vegetable juice. Low-sodium or reduced-sodium tomato sauce and tomato paste. Low-sodium or reduced-sodium canned vegetables. Fruits All fresh, dried, or frozen fruit. Canned fruit in natural juice (without  added sugar). Meat and other protein foods Skinless chicken or turkey. Ground chicken or turkey. Pork with fat trimmed off. Fish and seafood. Egg whites. Dried beans, peas, or lentils. Unsalted nuts, nut butters, and seeds. Unsalted canned beans. Lean cuts of beef with fat trimmed off. Low-sodium, lean deli meat. Dairy Low-fat (1%) or fat-free (skim) milk. Fat-free, low-fat, or reduced-fat cheeses. Nonfat, low-sodium ricotta or cottage cheese. Low-fat or nonfat yogurt. Low-fat, low-sodium cheese. Fats and oils Soft margarine without trans fats. Vegetable oil. Low-fat, reduced-fat, or light mayonnaise and salad dressings (reduced-sodium). Canola, safflower, olive, soybean, and sunflower oils. Avocado. Seasoning and other foods Herbs. Spices. Seasoning mixes without salt. Unsalted popcorn and pretzels. Fat-free sweets. What foods are not recommended? The items listed may not be a complete list. Talk with your dietitian about what dietary choices are best for you. Grains Baked goods made with fat, such as croissants, muffins, or some breads. Dry pasta or rice meal packs. Vegetables Creamed or fried vegetables. Vegetables in a cheese sauce. Regular canned vegetables (not low-sodium or reduced-sodium). Regular canned tomato sauce and paste (not low-sodium or reduced-sodium). Regular tomato and vegetable juice (not low-sodium or reduced-sodium). Pickles. Olives. Fruits Canned fruit in a light or heavy syrup. Fried fruit. Fruit in cream or butter sauce. Meat and other protein foods Fatty cuts of meat. Ribs. Fried meat. Bacon. Sausage. Bologna and other processed lunch meats. Salami. Fatback. Hotdogs. Bratwurst. Salted nuts and seeds. Canned beans with added salt. Canned or smoked fish. Whole eggs or egg yolks. Chicken or turkey with skin. Dairy Whole or 2% milk, cream, and half-and-half. Whole or full-fat cream cheese. Whole-fat or sweetened yogurt. Full-fat cheese. Nondairy creamers. Whipped toppings.  Processed cheese and cheese spreads. Fats and oils Butter. Stick margarine. Lard. Shortening. Ghee. Bacon fat. Tropical oils, such as coconut, palm kernel, or palm oil. Seasoning and other foods Salted popcorn and pretzels. Onion salt, garlic salt, seasoned salt, table salt, and sea salt. Worcestershire sauce. Tartar sauce. Barbecue sauce. Teriyaki sauce. Soy sauce, including reduced-sodium. Steak sauce. Canned and packaged gravies. Fish sauce. Oyster sauce. Cocktail sauce. Horseradish that you find on the shelf. Ketchup. Mustard. Meat flavorings and tenderizers. Bouillon cubes. Hot sauce and Tabasco sauce. Premade or packaged marinades. Premade or packaged taco seasonings. Relishes. Regular salad dressings. Where to find more information:  National Heart, Lung, and Blood Institute: www.nhlbi.nih.gov  American Heart Association: www.heart.org Summary  The DASH eating plan is a healthy eating plan that has been shown to reduce high blood pressure (hypertension). It may also reduce your risk for type 2 diabetes, heart disease, and stroke.  With the DASH eating plan, you should limit salt (sodium) intake to 2,300 mg a day. If you have hypertension, you may need to reduce your sodium intake to 1,500 mg a day.  When on the DASH eating plan, aim to eat more fresh fruits and vegetables, whole grains, lean proteins, low-fat dairy, and heart-healthy fats.  Work with your health care provider or diet and nutrition specialist (dietitian) to adjust your eating plan to your individual   calorie needs. This information is not intended to replace advice given to you by your health care provider. Make sure you discuss any questions you have with your health care provider. Document Released: 08/10/2011 Document Revised: 08/14/2016 Document Reviewed: 08/14/2016 Elsevier Interactive Patient Education  2018 [employer] Inc.  

## 2018-06-10 NOTE — Progress Notes (Signed)
   Subjective:    Patient ID: Pamela Mendez, female    DOB: 11-Jan-1966, 52 y.o.   MRN: [id number]  Headache   This is a new problem. Episode onset: 2 days. Associated symptoms include coughing and rhinorrhea. Pertinent negatives include no ear pain or fever.  having some light headness.   Went to ED on 06/08/18  For htn. Pt states she is doubling up on norvasc and taking 2 tabs for a total of 5mg .  ER notes reviewed Discussion held with patient She is having some headache facial pressure pain discomfort pain in the back of her head pain in the occipital region denies numbness into the arms or legs States her overall energy level is fair Felt horrible over the weekend  Congestion in head.   Review of Systems  Constitutional: Negative for activity change and fever.  HENT: Positive for congestion and rhinorrhea. Negative for ear pain.   Eyes: Negative for discharge.  Respiratory: Positive for cough. Negative for shortness of breath and wheezing.   Cardiovascular: Negative for chest pain.  Neurological: Positive for headaches.       Objective:   Physical Exam  Constitutional: She appears well-nourished. No distress.  HENT:  Head: Normocephalic.  Cardiovascular: Normal rate, regular rhythm and normal heart sounds.  No murmur heard. Pulmonary/Chest: Effort normal and breath sounds normal.  Musculoskeletal: She exhibits no edema.  Lymphadenopathy:    She has no cervical adenopathy.  Neurological: She is alert.  Psychiatric: Her behavior is normal.  Vitals reviewed.   Blood pressure very good on laying sitting standing no orthostatic changes Patient with significant tenderness in the occipital region consistent with occipital neuralgia causing the right side of her headache Patient was told if headache gets worse she needs to notify us I do not feel she needs a CAT scan at this point     Assessment & Plan:  HTN good control Amlodipine 5 mg proper dose New prescription  sent Watch diet Stay active  Acute rhinosinusitis antibiotics prescribed warning signs discussed follow-up if problems  Occipital neuralgia recommend anti-inflammatory over the next 7 to 10 days Patient to follow-up within 4 weeks for recheck of blood pressure

## 2018-06-10 NOTE — Telephone Encounter (Signed)
Patient states bp is going down. She says she has an appt today with you. She is aware to bring in medications and cuff with her to that appt.

## 2018-06-10 NOTE — Telephone Encounter (Signed)
I called and left a message to r/c. 

## 2018-06-13 LAB — TOXASSURE SELECT 13 (MW), URINE

## 2018-06-26 ENCOUNTER — Encounter: Payer: Self-pay | Admitting: Family Medicine

## 2018-06-26 ENCOUNTER — Ambulatory Visit: Payer: Medicaid Other | Admitting: Family Medicine

## 2018-06-26 VITALS — BP 130/82 | Ht 66.0 in | Wt 217.0 lb

## 2018-06-26 DIAGNOSIS — I10 Essential (primary) hypertension: Secondary | ICD-10-CM | POA: Diagnosis not present

## 2018-06-26 DIAGNOSIS — J0101 Acute recurrent maxillary sinusitis: Secondary | ICD-10-CM | POA: Diagnosis not present

## 2018-06-26 MED ORDER — LISINOPRIL 40 MG PO TABS: 40.0000 mg | ORAL_TABLET | Freq: Every day | ORAL | 5 refills | Status: DC

## 2018-06-26 MED ORDER — DOXYCYCLINE HYCLATE 100 MG PO TABS: 100.0000 mg | ORAL_TABLET | Freq: Two times a day (BID) | ORAL | 0 refills | Status: DC

## 2018-06-26 NOTE — Progress Notes (Signed)
   Subjective:    Patient ID: Pamela Mendez, female    DOB: June 06, 1966, 52 y.o.   MRN: [id number]  Hypertension  This is a chronic problem. The current episode started more than 1 year ago. Pertinent negatives include no chest pain, headaches or shortness of breath. Risk factors for coronary artery disease include post-menopausal state. Treatments tried: norvasc, lisinopril, demadex.   Patient stated the Norvasc was added a few weeks ago Patient tolerated medicine well so far  Patient also still having sinus issues-just finished doxycycline Patient with significant head congestion drainage coughing sinus pressure not feeling good denies high fever chills sweats  Review of Systems  Constitutional: Negative for activity change, fatigue and fever.  HENT: Negative for congestion and rhinorrhea.   Respiratory: Negative for cough, chest tightness and shortness of breath.   Cardiovascular: Negative for chest pain and leg swelling.  Gastrointestinal: Negative for abdominal pain and nausea.  Skin: Negative for color change.  Neurological: Negative for dizziness and headaches.  Psychiatric/Behavioral: Negative for agitation and behavioral problems.       Objective:   Physical Exam  Constitutional: She appears well-nourished. No distress.  HENT:  Head: Normocephalic and atraumatic.  Eyes: Right eye exhibits no discharge. Left eye exhibits no discharge.  Neck: No tracheal deviation present.  Cardiovascular: Normal rate, regular rhythm and normal heart sounds.  No murmur heard. Pulmonary/Chest: Effort normal and breath sounds normal. No respiratory distress.  Musculoskeletal: She exhibits no edema.  Lymphadenopathy:    She has no cervical adenopathy.  Neurological: She is alert. Coordination normal.  Skin: Skin is warm and dry.  Psychiatric: She has a normal mood and affect. Her behavior is normal.  Vitals reviewed.  Patient relates blood pressure once a day as above 90 otherwise it  looks good she denies any major issues currently       Assessment & Plan:  Blood pressure borderline control increase lisinopril 40 mg daily continue amlodipine notify us of blood pressure readings in a few weeks time follow-up within 3 months  Patient was seen today for upper respiratory illness. It is felt that the patient is dealing with sinusitis.  Antibiotics were prescribed today. Importance of compliance with medication was discussed.  Symptoms should gradually resolve over the course of the next several days. If high fevers, progressive illness, difficulty breathing, worsening condition or failure for symptoms to improve over the next several days then the patient is to follow-up.  If any emergent conditions the patient is to follow-up in the emergency department otherwise to follow-up in the office.

## 2018-08-12 ENCOUNTER — Telehealth: Payer: Self-pay | Admitting: Family Medicine

## 2018-08-12 NOTE — Telephone Encounter (Signed)
Fax from pharmacy requesting PA on Hydrocodone 10-325 mg. [place] [facility] form in provider office to review and sign. Please advise. Thank you.

## 2018-08-13 NOTE — Telephone Encounter (Signed)
Form faxed to [facility]. 

## 2018-08-19 ENCOUNTER — Ambulatory Visit: Payer: Medicaid Other | Admitting: Family Medicine

## 2018-08-19 ENCOUNTER — Encounter: Payer: Self-pay | Admitting: Family Medicine

## 2018-08-19 ENCOUNTER — Telehealth: Payer: Self-pay | Admitting: Family Medicine

## 2018-08-19 VITALS — BP 132/82 | Temp 98.2°F | Ht 66.0 in | Wt 215.6 lb

## 2018-08-19 DIAGNOSIS — H6504 Acute serous otitis media, recurrent, right ear: Secondary | ICD-10-CM

## 2018-08-19 DIAGNOSIS — J019 Acute sinusitis, unspecified: Secondary | ICD-10-CM | POA: Diagnosis not present

## 2018-08-19 MED ORDER — DOXYCYCLINE HYCLATE 100 MG PO TABS: 100.0000 mg | ORAL_TABLET | Freq: Two times a day (BID) | ORAL | 0 refills | Status: DC

## 2018-08-19 NOTE — Telephone Encounter (Signed)
Pt in office today for visit and was checking on medication. Form was faxed to [facility] but it was the wrong form. Correct form is filled out and in provider office.

## 2018-08-19 NOTE — Progress Notes (Signed)
   Subjective:    Patient ID: Pamela Mendez, female    DOB: [birth year], 52 y.o.   MRN: [id number]  Sinus Problem  This is a new problem. The current episode started 1 to 4 weeks ago. Associated symptoms include congestion, coughing and headaches. Pertinent negatives include no ear pain or shortness of breath. (Fever and wheezing) Treatments tried: mucinex.   pmh frequent sinus   Review of Systems  Constitutional: Negative for activity change and fever.  HENT: Positive for congestion and rhinorrhea. Negative for ear pain.   Eyes: Negative for discharge.  Respiratory: Positive for cough. Negative for shortness of breath and wheezing.   Cardiovascular: Negative for chest pain.  Neurological: Positive for headaches.       Objective:   Physical Exam Vitals signs and nursing note reviewed.  Constitutional:      Appearance: She is well-developed.  HENT:     Head: Normocephalic.     Nose: Nose normal.     Mouth/Throat:     Pharynx: No oropharyngeal exudate.  Neck:     Musculoskeletal: Neck supple.  Cardiovascular:     Rate and Rhythm: Normal rate.     Heart sounds: Normal heart sounds. No murmur.  Pulmonary:     Effort: Pulmonary effort is normal.     Breath sounds: Normal breath sounds. No wheezing.  Lymphadenopathy:     Cervical: No cervical adenopathy.  Skin:    General: Skin is warm and dry.           Assessment & Plan:  Patient was seen today for upper respiratory illness. It is felt that the patient is dealing with sinusitis.  Antibiotics were prescribed today. Importance of compliance with medication was discussed.  Symptoms should gradually resolve over the course of the next several days. If high fevers, progressive illness, difficulty breathing, worsening condition or failure for symptoms to improve over the next several days then the patient is to follow-up.  If any emergent conditions the patient is to follow-up in the emergency department otherwise to  follow-up in the office.

## 2018-08-20 NOTE — Telephone Encounter (Signed)
Form faxed to [employer] [facility]. 

## 2018-08-20 NOTE — Telephone Encounter (Signed)
This form was completed thank you 

## 2018-08-26 ENCOUNTER — Telehealth: Payer: Self-pay | Admitting: Family Medicine

## 2018-08-26 NOTE — Telephone Encounter (Signed)
Pt called and states she got a denial because the reason she takes pain med was not on form. Form redone and copy of office visit note from last pain management visit included with form. See other phone message from 12/23

## 2018-08-26 NOTE — Telephone Encounter (Signed)
Pt states [place] [facility] denied request for refill for HYDROcodone-acetaminophen (NORCO) 10-325 MG tablet, pt states they need information sent over to them supporting why she needs this medication.

## 2018-08-26 NOTE — Telephone Encounter (Signed)
Await response.

## 2018-08-26 NOTE — Telephone Encounter (Signed)
Pt states she has been out of her med for 2 weeks now. She states she received a letter stating it was denied because it did not have a reason to why she needed pain med. I faxed over a new form and attached last office visit note she had for pain management. Put on cover sheet. Urgent pt is out of med and let pt know we were resending today.

## 2018-08-27 NOTE — Telephone Encounter (Signed)
Pt calling to check on status, informed patient information that was needed for why was faxed and also informed her that urgent request was placed on cover sheet with information that was sent.

## 2018-08-27 NOTE — Telephone Encounter (Signed)
Spoke with  [facility]- Hydrocodone approved 08/26/18-01/24/19. Pharmacy and patient notified.

## 2018-08-29 ENCOUNTER — Telehealth: Payer: Self-pay | Admitting: *Deleted

## 2018-08-29 NOTE — Telephone Encounter (Signed)
error 

## 2018-09-02 ENCOUNTER — Telehealth: Payer: Self-pay | Admitting: Family Medicine

## 2018-09-02 ENCOUNTER — Other Ambulatory Visit: Payer: Self-pay | Admitting: *Deleted

## 2018-09-02 DIAGNOSIS — H9209 Otalgia, unspecified ear: Secondary | ICD-10-CM

## 2018-09-02 MED ORDER — DOXYCYCLINE HYCLATE 100 MG PO TABS: 100.0000 mg | ORAL_TABLET | Freq: Two times a day (BID) | ORAL | 0 refills | Status: DC

## 2018-09-02 NOTE — Telephone Encounter (Signed)
Please advise 

## 2018-09-02 NOTE — Telephone Encounter (Signed)
1.  Go ahead with ENT referral 2.  Doxycycline refill for 10 days 1 twice daily 100 mg

## 2018-09-02 NOTE — Telephone Encounter (Signed)
Pt states from her last visit she still has a clogged nose, sinus headaches, no fever, no nausea or vomiting, pt states at last visit 08/19/2018 with Dr.Scott there was a discussion if pt did not feel any better than another refill for doxycycline (VIBRA-TABS) 100 MG tablet could be called in. Advise.   Also pt states if fluid has not eased up with her ears there was a discussion in regards of a referral being placed for ENT, pt would like to go ahead with referral.    Pharmacy:  [facility] - [place],  - [address]

## 2018-09-02 NOTE — Telephone Encounter (Signed)
Referral put in and doxy sent to pharm. Pt notified.

## 2018-09-06 ENCOUNTER — Encounter: Payer: Self-pay | Admitting: Family Medicine

## 2018-09-06 ENCOUNTER — Ambulatory Visit: Payer: Medicaid Other | Admitting: Family Medicine

## 2018-09-06 VITALS — BP 108/82 | Ht 66.0 in | Wt 215.0 lb

## 2018-09-06 DIAGNOSIS — G8929 Other chronic pain: Secondary | ICD-10-CM

## 2018-09-06 DIAGNOSIS — M5440 Lumbago with sciatica, unspecified side: Secondary | ICD-10-CM | POA: Diagnosis not present

## 2018-09-06 DIAGNOSIS — R7303 Prediabetes: Secondary | ICD-10-CM

## 2018-09-06 DIAGNOSIS — Z1211 Encounter for screening for malignant neoplasm of colon: Secondary | ICD-10-CM | POA: Diagnosis not present

## 2018-09-06 DIAGNOSIS — I10 Essential (primary) hypertension: Secondary | ICD-10-CM | POA: Diagnosis not present

## 2018-09-06 DIAGNOSIS — E876 Hypokalemia: Secondary | ICD-10-CM | POA: Diagnosis not present

## 2018-09-06 DIAGNOSIS — E782 Mixed hyperlipidemia: Secondary | ICD-10-CM

## 2018-09-06 MED ORDER — HYDROCODONE-ACETAMINOPHEN 10-325 MG PO TABS: ORAL_TABLET | ORAL | 0 refills | Status: DC

## 2018-09-06 NOTE — Progress Notes (Signed)
Subjective:    Patient ID: Pamela Mendez, female    DOB: 05/15/1966, 53 y.o.   MRN: [id number]  HPI  This patient was seen today for chronic pain  The medication list was reviewed and updated.   -Compliance with medication: Yes  - Number patient states they take daily: 2-4 mostly four per day.  -when was the last dose patient took? Yesterday night   The patient was advised the importance of maintaining medication and not using illegal substances with these.  Here for refills and follow up Yes  The patient was educated that we can provide 3 monthly scripts for their medication, it is their responsibility to follow the instructions.  Side effects or complications from medications: None  Patient is aware that pain medications are meant to minimize the severity of the pain to allow their pain levels to improve to allow for better function. They are aware of that pain medications cannot totally remove their pain.  Due for UDT ( at least once per year) : 06/23/2019  Last filled 06/26/2018 per Epic this was the last time we printed script.No other concerns today.   Patient here for follow-up regarding cholesterol.  The patient does have hyperlipidemia.  Patient does try to maintain a reasonable diet.  Patient does take the medication on a regular basis.  Denies missing a dose.  The patient denies any obvious side effects.  Prior blood work results reviewed with the patient.  The patient is aware of his cholesterol goals and the need to keep it under good control to lessen the risk of disease.  Patient for blood pressure check up.  The patient does have hypertension.  The patient is on medication.  Patient relates compliance with meds. Todays BP reviewed with the patient. Patient denies issues with medication. Patient relates reasonable diet. Patient tries to minimize salt. Patient aware of BP goals.  Patient does have history of prediabetes watches her diet try to keep her weight down  and trying to stay active it is difficult for her to exercise because of back pain  Patient does need colon cancer screening and referral for this we discussed this in detail  Patient is having low back pain discomfort states pain medicine is necessary takes up to 4/day it does help her function better.  Review of Systems  Constitutional: Negative for activity change, appetite change and fatigue.  HENT: Negative for congestion and rhinorrhea.   Respiratory: Negative for cough and shortness of breath.   Cardiovascular: Negative for chest pain and leg swelling.  Gastrointestinal: Negative for abdominal pain and diarrhea.  Endocrine: Negative for polydipsia and polyphagia.  Skin: Negative for color change.  Neurological: Negative for dizziness and weakness.  Psychiatric/Behavioral: Negative for behavioral problems and confusion.       Objective:   Physical Exam Vitals signs reviewed.  Constitutional:      General: She is not in acute distress. HENT:     Head: Normocephalic and atraumatic.  Eyes:     General:        Right eye: No discharge.        Left eye: No discharge.  Neck:     Trachea: No tracheal deviation.  Cardiovascular:     Rate and Rhythm: Normal rate and regular rhythm.     Heart sounds: Normal heart sounds. No murmur.  Pulmonary:     Effort: Pulmonary effort is normal. No respiratory distress.     Breath sounds: Normal breath sounds.  Lymphadenopathy:  Cervical: No cervical adenopathy.  Skin:    General: Skin is warm and dry.  Neurological:     Mental Status: She is alert.     Coordination: Coordination normal.  Psychiatric:        Behavior: Behavior normal.           Assessment & Plan:  Hyperlipidemia-patient will check her lab work somewhere between now and February she will try to do the best she can eat healthy-continue pravastatin  Chronic low back pain with sciatica patient benefits from the pain medicine she states it helps her function drug  registry was checked.  3 prescription sent in.  She is to use medication 4 times daily  History hypokalemia check metabolic 7  Blood pressure actually on the low end today she will check it several times per week she will bring her monitor with her to follow-up again in 1 month if it is still on the low end consider reducing the lisinopril from 40 mg down to 20 mg  Prediabetes check A1c watch diet minimize sugars  Colon cancer screening referral for colonoscopy  25 minutes was spent with the patient.  This statement verifies that 25 minutes was indeed spent with the patient.  More than 50% of this visit-total duration of the visit-was spent in counseling and coordination of care. The issues that the patient came in for today as reflected in the diagnosis (s) please refer to documentation for further details.

## 2018-09-12 ENCOUNTER — Encounter: Payer: Self-pay | Admitting: Family Medicine

## 2018-09-13 ENCOUNTER — Encounter: Payer: Self-pay | Admitting: Family Medicine

## 2018-09-16 ENCOUNTER — Encounter (INDEPENDENT_AMBULATORY_CARE_PROVIDER_SITE_OTHER): Payer: Self-pay | Admitting: *Deleted

## 2018-10-03 DIAGNOSIS — I10 Essential (primary) hypertension: Secondary | ICD-10-CM | POA: Diagnosis not present

## 2018-10-03 DIAGNOSIS — R7303 Prediabetes: Secondary | ICD-10-CM | POA: Diagnosis not present

## 2018-10-03 DIAGNOSIS — E782 Mixed hyperlipidemia: Secondary | ICD-10-CM | POA: Diagnosis not present

## 2018-10-04 ENCOUNTER — Encounter: Payer: Self-pay | Admitting: Family Medicine

## 2018-10-04 ENCOUNTER — Ambulatory Visit: Payer: Medicaid Other | Admitting: Family Medicine

## 2018-10-04 VITALS — BP 136/88 | Ht 66.0 in | Wt 208.8 lb

## 2018-10-04 DIAGNOSIS — J019 Acute sinusitis, unspecified: Secondary | ICD-10-CM

## 2018-10-04 DIAGNOSIS — E782 Mixed hyperlipidemia: Secondary | ICD-10-CM | POA: Diagnosis not present

## 2018-10-04 DIAGNOSIS — R7303 Prediabetes: Secondary | ICD-10-CM | POA: Diagnosis not present

## 2018-10-04 DIAGNOSIS — I10 Essential (primary) hypertension: Secondary | ICD-10-CM | POA: Diagnosis not present

## 2018-10-04 LAB — BASIC METABOLIC PANEL
BUN/Creatinine Ratio: 10 (ref 9–23)
BUN: 9 mg/dL (ref 6–24)
CO2: 22 mmol/L (ref 20–29)
CREATININE: 0.92 mg/dL (ref 0.57–1.00)
Calcium: 9.2 mg/dL (ref 8.7–10.2)
Chloride: 103 mmol/L (ref 96–106)
GFR calc Af Amer: 83 mL/min/{1.73_m2} (ref >59)
GFR calc non Af Amer: 72 mL/min/{1.73_m2} (ref >59)
Glucose: 115 mg/dL — ABNORMAL HIGH (ref 65–99)
Potassium: 4.3 mmol/L (ref 3.5–5.2)
Sodium: 142 mmol/L (ref 134–144)

## 2018-10-04 LAB — LIPID PANEL
Chol/HDL Ratio: 4.6 ratio — ABNORMAL HIGH (ref 0.0–4.4)
Cholesterol, Total: 193 mg/dL (ref 100–199)
HDL: 42 mg/dL (ref >39)
LDL Calculated: 116 mg/dL — ABNORMAL HIGH (ref 0–99)
Triglycerides: 174 mg/dL — ABNORMAL HIGH (ref 0–149)
VLDL Cholesterol Cal: 35 mg/dL (ref 5–40)

## 2018-10-04 LAB — HEMOGLOBIN A1C
Est. average glucose Bld gHb Est-mCnc: 123 mg/dL
HEMOGLOBIN A1C: 5.9 % — AB (ref 4.8–5.6)

## 2018-10-04 MED ORDER — DOXYCYCLINE HYCLATE 100 MG PO TABS: 100.0000 mg | ORAL_TABLET | Freq: Two times a day (BID) | ORAL | 0 refills | Status: DC

## 2018-10-04 MED ORDER — PRAVASTATIN SODIUM 80 MG PO TABS: 80.0000 mg | ORAL_TABLET | Freq: Every day | ORAL | 5 refills | Status: DC

## 2018-10-04 NOTE — Progress Notes (Signed)
   Subjective:    Patient ID: Pamela Mendez, female    DOB: 11/24/1965, 53 y.o.   MRN: [id number]  Hypertension  This is a chronic problem. Pertinent negatives include no chest pain or shortness of breath. Compliance problems: takes meds every day, walks some, eats healthy.   Relates taking her medicine denies dizziness chest tightness passing out  Recent lab work showed hyperlipidemia not as well-controlled as what we would like along with prediabetes  Patient was counseled on healthy eating Right ear pain. Sees Dr. Suszanne Conners on feb 13th but now having sharp pain in ear.  Patient relates sharp pain in the ear along with some fullness will be seen by ENT later this month  Review of Systems  Constitutional: Negative for activity change, appetite change and fatigue.  HENT: Negative for congestion and rhinorrhea.   Respiratory: Negative for cough and shortness of breath.   Cardiovascular: Negative for chest pain and leg swelling.  Gastrointestinal: Negative for abdominal pain and diarrhea.  Endocrine: Negative for polydipsia and polyphagia.  Skin: Negative for color change.  Neurological: Negative for dizziness and weakness.  Psychiatric/Behavioral: Negative for behavioral problems and confusion.       Objective:   Physical Exam Vitals signs reviewed.  Constitutional:      General: She is not in acute distress. HENT:     Head: Normocephalic and atraumatic.  Eyes:     General:        Right eye: No discharge.        Left eye: No discharge.  Neck:     Trachea: No tracheal deviation.  Cardiovascular:     Rate and Rhythm: Normal rate and regular rhythm.     Heart sounds: Normal heart sounds. No murmur.  Pulmonary:     Effort: Pulmonary effort is normal. No respiratory distress.     Breath sounds: Normal breath sounds.  Lymphadenopathy:     Cervical: No cervical adenopathy.  Skin:    General: Skin is warm and dry.  Neurological:     Mental Status: She is alert.     Coordination:  Coordination normal.  Psychiatric:        Behavior: Behavior normal.    On examination mild right otitis media noted.       Assessment & Plan:  Hyperlipidemia subpar control increase pravastatin to 80 mg daily we will recheck this again within 6 months  Prediabetes minimize starches minimize sugary foods stay away from sugary drinks exercise on a regular basis relook at this again within 6 months  HTN blood pressure check sitting and standing overall under good control continue medications watch diet  Right otitis media antibiotics prescribed warning signs discussed follow-up with ENT later this month Doxycycline prescribed  25 minutes was spent with the patient.  This statement verifies that 25 minutes was indeed spent with the patient.  More than 50% of this visit-total duration of the visit-was spent in counseling and coordination of care. The issues that the patient came in for today as reflected in the diagnosis (s) please refer to documentation for further details.

## 2018-10-17 ENCOUNTER — Ambulatory Visit (INDEPENDENT_AMBULATORY_CARE_PROVIDER_SITE_OTHER): Payer: Medicaid Other | Admitting: Otolaryngology

## 2018-10-17 DIAGNOSIS — H6122 Impacted cerumen, left ear: Secondary | ICD-10-CM

## 2018-10-17 DIAGNOSIS — H900 Conductive hearing loss, bilateral: Secondary | ICD-10-CM | POA: Diagnosis not present

## 2018-10-17 DIAGNOSIS — H6523 Chronic serous otitis media, bilateral: Secondary | ICD-10-CM

## 2018-10-17 DIAGNOSIS — H6983 Other specified disorders of Eustachian tube, bilateral: Secondary | ICD-10-CM | POA: Diagnosis not present

## 2018-10-25 DIAGNOSIS — H9011 Conductive hearing loss, unilateral, right ear, with unrestricted hearing on the contralateral side: Secondary | ICD-10-CM | POA: Diagnosis not present

## 2018-10-25 DIAGNOSIS — H6521 Chronic serous otitis media, right ear: Secondary | ICD-10-CM | POA: Diagnosis not present

## 2018-10-25 DIAGNOSIS — H7112 Cholesteatoma of tympanum, left ear: Secondary | ICD-10-CM | POA: Diagnosis not present

## 2018-10-25 DIAGNOSIS — H6981 Other specified disorders of Eustachian tube, right ear: Secondary | ICD-10-CM | POA: Diagnosis not present

## 2018-11-05 ENCOUNTER — Other Ambulatory Visit (HOSPITAL_COMMUNITY): Payer: Self-pay | Admitting: Otolaryngology

## 2018-11-05 ENCOUNTER — Other Ambulatory Visit: Payer: Self-pay | Admitting: Otolaryngology

## 2018-11-05 DIAGNOSIS — H6042 Cholesteatoma of left external ear: Secondary | ICD-10-CM

## 2018-11-07 ENCOUNTER — Ambulatory Visit (HOSPITAL_COMMUNITY)
Admission: RE | Admit: 2018-11-07 | Discharge: 2018-11-07 | Disposition: A | Discharge status: Home / Self Care | Payer: Medicaid Other | Source: Ambulatory Visit | Attending: Family Medicine | Admitting: Family Medicine

## 2018-11-07 ENCOUNTER — Ambulatory Visit: Payer: Medicaid Other | Admitting: Family Medicine

## 2018-11-07 ENCOUNTER — Encounter: Payer: Self-pay | Admitting: Family Medicine

## 2018-11-07 VITALS — BP 138/88 | Ht 66.0 in | Wt 210.0 lb

## 2018-11-07 DIAGNOSIS — M25511 Pain in right shoulder: Secondary | ICD-10-CM

## 2018-11-07 MED ORDER — PREDNISONE 20 MG PO TABS: ORAL_TABLET | ORAL | 0 refills | Status: DC

## 2018-11-07 NOTE — Patient Instructions (Addendum)
Stop naproxen temporarily while taking prednisone.   Shoulder Exercises Ask your health care provider which exercises are safe for you. Do exercises exactly as told by your health care provider and adjust them as directed. It is normal to feel mild stretching, pulling, tightness, or discomfort as you do these exercises, but you should stop right away if you feel sudden pain or your pain gets worse.Do not begin these exercises until told by your health care provider. Range of Motion Exercises        These exercises warm up your muscles and joints and improve the movement and flexibility of your shoulder. These exercises also help to relieve pain, numbness, and tingling. These exercises involve stretching your injured shoulder directly. Exercise A: Pendulum 1. Stand near a wall or a surface that you can hold onto for balance. 2. Bend at the waist and let your left / right arm hang straight down. Use your other arm to support you. Keep your back straight and do not lock your knees. 3. Relax your left / right arm and shoulder muscles, and move your hips and your trunk so your left / right arm swings freely. Your arm should swing because of the motion of your body, not because you are using your arm or shoulder muscles. 4. Keep moving your body so your arm swings in the following directions, as told by your health care provider: ? Side to side. ? Forward and backward. ? In clockwise and counterclockwise circles. 5. Continue each motion for __________ seconds, or for as long as told by your health care provider. 6. Slowly return to the starting position. Repeat __________ times. Complete this exercise __________ times a day. Exercise B:Flexion, Standing 1. Stand and hold a broomstick, a cane, or a similar object. Place your hands a little more than shoulder-width apart on the object. Your left / right hand should be palm-up, and your other hand should be palm-down. 2. Keep your elbow straight and  keep your shoulder muscles relaxed. Push the stick down with your healthy arm to raise your left / right arm in front of your body, and then over your head until you feel a stretch in your shoulder. ? Avoid shrugging your shoulder while you raise your arm. Keep your shoulder blade tucked down toward the middle of your back. 3. Hold for __________ seconds. 4. Slowly return to the starting position. Repeat __________ times. Complete this exercise __________ times a day. Exercise C: Abduction, Standing 1. Stand and hold a broomstick, a cane, or a similar object. Place your hands a little more than shoulder-width apart on the object. Your left / right hand should be palm-up, and your other hand should be palm-down. 2. While keeping your elbow straight and your shoulder muscles relaxed, push the stick across your body toward your left / right side. Raise your left / right arm to the side of your body and then over your head until you feel a stretch in your shoulder. ? Do not raise your arm above shoulder height, unless your health care provider tells you to do that. ? Avoid shrugging your shoulder while you raise your arm. Keep your shoulder blade tucked down toward the middle of your back. 3. Hold for __________ seconds. 4. Slowly return to the starting position. Repeat __________ times. Complete this exercise __________ times a day. Exercise D:Internal Rotation 1. Place your left / right hand behind your back, palm-up. 2. Use your other hand to dangle an exercise band, a  towel, or a similar object over your shoulder. Grasp the band with your left / right hand so you are holding onto both ends. 3. Gently pull up on the band until you feel a stretch in the front of your left / right shoulder. ? Avoid shrugging your shoulder while you raise your arm. Keep your shoulder blade tucked down toward the middle of your back. 4. Hold for __________ seconds. 5. Release the stretch by letting go of the band and  lowering your hands. Repeat __________ times. Complete this exercise __________ times a day. Stretching Exercises  These exercises warm up your muscles and joints and improve the movement and flexibility of your shoulder. These exercises also help to relieve pain, numbness, and tingling. These exercises are done using your healthy shoulder to help stretch the muscles of your injured shoulder. Exercise E: Research officer, political party (External Rotation and Abduction) 1. Stand in a doorway with one of your feet slightly in front of the other. This is called a staggered stance. If you cannot reach your forearms to the door frame, stand facing a corner of a room. 2. Choose one of the following positions as told by your health care provider: ? Place your hands and forearms on the door frame above your head. ? Place your hands and forearms on the door frame at the height of your head. ? Place your hands on the door frame at the height of your elbows. 3. Slowly move your weight onto your front foot until you feel a stretch across your chest and in the front of your shoulders. Keep your head and chest upright and keep your abdominal muscles tight. 4. Hold for __________ seconds. 5. To release the stretch, shift your weight to your back foot. Repeat __________ times. Complete this stretch __________ times a day. Exercise F:Extension, Standing 1. Stand and hold a broomstick, a cane, or a similar object behind your back. ? Your hands should be a little wider than shoulder-width apart. ? Your palms should face away from your back. 2. Keeping your elbows straight and keeping your shoulder muscles relaxed, move the stick away from your body until you feel a stretch in your shoulder. ? Avoid shrugging your shoulders while you move the stick. Keep your shoulder blade tucked down toward the middle of your back. 3. Hold for __________ seconds. 4. Slowly return to the starting position. Repeat __________ times. Complete this  exercise __________ times a day. Strengthening Exercises           These exercises build strength and endurance in your shoulder. Endurance is the ability to use your muscles for a long time, even after they get tired. Exercise G:External Rotation 1. Sit in a stable chair without armrests. 2. Secure an exercise band at elbow height on your left / right side. 3. Place a soft object, such as a folded towel or a small pillow, between your left / right upper arm and your body to move your elbow a few inches away (about 10 cm) from your side. 4. Hold the end of the band so it is tight and there is no slack. 5. Keeping your elbow pressed against the soft object, move your left / right forearm out, away from your abdomen. Keep your body steady so only your forearm moves. 6. Hold for __________ seconds. 7. Slowly return to the starting position. Repeat __________ times. Complete this exercise __________ times a day. Exercise H:Shoulder Abduction 1. Sit in a stable chair without armrests, or  stand. 2. Hold a __________ weight in your left / right hand, or hold an exercise band with both hands. 3. Start with your arms straight down and your left / right palm facing in, toward your body. 4. Slowly lift your left / right hand out to your side. Do not lift your hand above shoulder height unless your health care provider tells you that this is safe. ? Keep your arms straight. ? Avoid shrugging your shoulder while you do this movement. Keep your shoulder blade tucked down toward the middle of your back. 5. Hold for __________ seconds. 6. Slowly lower your arm, and return to the starting position. Repeat __________ times. Complete this exercise __________ times a day. Exercise I:Shoulder Extension 1. Sit in a stable chair without armrests, or stand. 2. Secure an exercise band to a stable object in front of you where it is at shoulder height. 3. Hold one end of the exercise band in each hand. Your  palms should face each other. 4. Straighten your elbows and lift your hands up to shoulder height. 5. Step back, away from the secured end of the exercise band, until the band is tight and there is no slack. 6. Squeeze your shoulder blades together as you pull your hands down to the sides of your thighs. Stop when your hands are straight down by your sides. Do not let your hands go behind your body. 7. Hold for __________ seconds. 8. Slowly return to the starting position. Repeat __________ times. Complete this exercise __________ times a day. Exercise J:Standing Shoulder Row 1. Sit in a stable chair without armrests, or stand. 2. Secure an exercise band to a stable object in front of you so it is at waist height. 3. Hold one end of the exercise band in each hand. Your palms should be in a thumbs-up position. 4. Bend each of your elbows to an "L" shape (about 90 degrees) and keep your upper arms at your sides. 5. Step back until the band is tight and there is no slack. 6. Slowly pull your elbows back behind you. 7. Hold for __________ seconds. 8. Slowly return to the starting position. Repeat __________ times. Complete this exercise __________ times a day. Exercise K:Shoulder Press-Ups 1. Sit in a stable chair that has armrests. Sit upright, with your feet flat on the floor. 2. Put your hands on the armrests so your elbows are bent and your fingers are pointing forward. Your hands should be about even with the sides of your body. 3. Push down on the armrests and use your arms to lift yourself off of the chair. Straighten your elbows and lift yourself up as much as you comfortably can. ? Move your shoulder blades down, and avoid letting your shoulders move up toward your ears. ? Keep your feet on the ground. As you get stronger, your feet should support less of your body weight as you lift yourself up. 4. Hold for __________ seconds. 5. Slowly lower yourself back into the chair. Repeat  __________ times. Complete this exercise __________ times a day. Exercise L: Wall Push-Ups 1. Stand so you are facing a stable wall. Your feet should be about one arm-length away from the wall. 2. Lean forward and place your palms on the wall at shoulder height. 3. Keep your feet flat on the floor as you bend your elbows and lean forward toward the wall. 4. Hold for __________ seconds. 5. Straighten your elbows to push yourself back to the starting position.  Repeat __________ times. Complete this exercise __________ times a day. This information is not intended to replace advice given to you by your health care provider. Make sure you discuss any questions you have with your health care provider. Document Released: 07/05/2005 Document Revised: 12/25/2017 Document Reviewed: 05/02/2015 Elsevier Interactive Patient Education  2019 [employer].

## 2018-11-07 NOTE — Progress Notes (Signed)
Subjective:    Patient ID: Pamela Mendez, female    DOB: [birth year], 53 y.o.   MRN: [id number]  Shoulder Pain   Pain location: right shoulder. This is a new problem. The current episode started yesterday. Pertinent negatives include no fever or numbness. Treatments tried: icy hot, hyrdocodone    Woke up yesterday morning with right shoulder pain. Worse today. No known injury. No change in activity, no repetitive movements. Reports pain only with movement described as a stabbing pain. Ache when she's not using it. States pain radiates down right arm. No numbness or tingling.   Already taking naproxen bid. Has tried her hydrocodone without relief.   Review of Systems  Constitutional: Negative for chills, fever and unexpected weight change.  Respiratory: Negative for shortness of breath.   Cardiovascular: Negative for chest pain.  Musculoskeletal: Negative for joint swelling, neck pain and neck stiffness.  Neurological: Negative for numbness.       Objective:   Physical Exam Vitals signs and nursing note reviewed.  Constitutional:      General: She is not in acute distress.    Appearance: Normal appearance. She is not toxic-appearing.  HENT:     Head: Normocephalic and atraumatic.  Neck:     Musculoskeletal: Normal range of motion and neck supple. No neck rigidity.  Cardiovascular:     Rate and Rhythm: Normal rate and regular rhythm.     Heart sounds: Normal heart sounds.  Pulmonary:     Effort: Pulmonary effort is normal. No respiratory distress.     Breath sounds: Normal breath sounds. No wheezing or rales.  Musculoskeletal:     Right shoulder: She exhibits decreased range of motion, tenderness, bony tenderness and pain. She exhibits no swelling, no effusion, no deformity and normal pulse.     Comments: Left shoulder with full active and passive ROM, strength 5/5 on rotator cuff testing. Radial pulse 2+.   Right shoulder with decreased active and passive ROM, only able to  extend to right above shoulder level no further without significant pain. Tender to palpation over [facility] joint and into musculature of shoulder above scapula. No obvious deformity  Noted. Pain with all Rotator cuff testing, some weakness noted with testing of infraspinatus and supraspinatus. Pt unable to internally rotate right shoulder to place hand at back. Radial pulse 2+, grip strength equal bilaterally.   Lymphadenopathy:     Cervical: No cervical adenopathy.  Skin:    General: Skin is warm and dry.     Findings: No bruising or erythema.  Neurological:     Mental Status: She is alert and oriented to person, place, and time.  Psychiatric:        Behavior: Behavior normal.           Assessment & Plan:  Acute pain of right shoulder - Plan: DG Shoulder Right  Etiology of acute shoulder pain unclear at this point. Could potentially be an issue with the rotator cuff or degenerative changes in the absence of acute injury. Will obtain x-ray. Prednisone taper, instructed to temporarily stop naproxen and any other NSAIDs while taking this medication. May continue to use her hydrocodone prn. Recommended gentle stretching exercise. If x-ray is normal, continue with conservative measures, if no improvement or worsening of her symptoms in the next 2 weeks recommend she f/u and will potentially need referral to ortho at that time.   Dr. Lubertha South was consulted on this case and is in agreement with the above treatment  plan.

## 2018-11-14 ENCOUNTER — Telehealth: Payer: Self-pay | Admitting: Family Medicine

## 2018-11-14 DIAGNOSIS — M25519 Pain in unspecified shoulder: Secondary | ICD-10-CM

## 2018-11-14 NOTE — Telephone Encounter (Signed)
Discussed with pt. Pt verbalized understanding. Ortho referral put in.

## 2018-11-14 NOTE — Telephone Encounter (Signed)
Sorry to hear that she is not having any improvement in her pain. At this point I would recommend an ortho referral for further evaluation and treatment options. Thanks!

## 2018-11-14 NOTE — Telephone Encounter (Signed)
Patient saw Lillia Abed 11/07/18 for shoulder pain, xray was normal, was given prednisone.  Still having a lot of pain and wants to know what the next step is?

## 2018-11-14 NOTE — Telephone Encounter (Signed)
Please advise. Thank you

## 2018-11-18 ENCOUNTER — Encounter: Payer: Self-pay | Admitting: Family Medicine

## 2018-11-20 ENCOUNTER — Ambulatory Visit (INDEPENDENT_AMBULATORY_CARE_PROVIDER_SITE_OTHER): Payer: Medicaid Other | Admitting: Orthopaedic Surgery

## 2018-11-20 ENCOUNTER — Ambulatory Visit (HOSPITAL_COMMUNITY)
Admission: RE | Admit: 2018-11-20 | Discharge: 2018-11-20 | Disposition: A | Discharge status: Home / Self Care | Payer: Medicaid Other | Source: Ambulatory Visit | Attending: Otolaryngology | Admitting: Otolaryngology

## 2018-11-20 ENCOUNTER — Encounter (INDEPENDENT_AMBULATORY_CARE_PROVIDER_SITE_OTHER): Payer: Self-pay | Admitting: Orthopaedic Surgery

## 2018-11-20 ENCOUNTER — Other Ambulatory Visit: Payer: Self-pay

## 2018-11-20 DIAGNOSIS — H6042 Cholesteatoma of left external ear: Secondary | ICD-10-CM | POA: Diagnosis not present

## 2018-11-20 DIAGNOSIS — M25511 Pain in right shoulder: Secondary | ICD-10-CM | POA: Diagnosis not present

## 2018-11-20 MED ORDER — METHYLPREDNISOLONE ACETATE 40 MG/ML IJ SUSP
80.0000 mg | INTRAMUSCULAR | Status: AC | PRN
  Administered 2018-11-20: 80 mg via INTRA_ARTICULAR

## 2018-11-20 MED ORDER — LIDOCAINE HCL 2 % IJ SOLN
2.0000 mL | INTRAMUSCULAR | Status: AC | PRN
  Administered 2018-11-20: 2 mL

## 2018-11-20 MED ORDER — BUPIVACAINE HCL 0.5 % IJ SOLN
2.0000 mL | INTRAMUSCULAR | Status: AC | PRN
  Administered 2018-11-20: 2 mL via INTRA_ARTICULAR

## 2018-11-20 NOTE — Progress Notes (Signed)
Office Visit Note   Patient: Pamela Mendez           Date of Birth: [birth year]           MRN: [id number] Visit Date: 11/20/2018              Requested by: Babs Sciara, MD [address] [place], [place] [address] PCP: Babs Sciara, MD   Assessment & Plan: Visit Diagnoses:  1. Acute pain of right shoulder     Plan: Impingement syndrome right shoulder.  Will inject subacromial region with cortisone and monitor response.  Initially had excellent pain relief and increase range of motion. Al so discussed elevated blood pressure  Follow-Up Instructions: Return in about 1 month (around 12/21/2018).   Orders:  Orders Placed This Encounter  Procedures   Large Joint Inj: R subacromial bursa   No orders of the defined types were placed in this encounter.     Procedures: Large Joint Inj: R subacromial bursa on 11/20/2018 11:37 AM Indications: pain and diagnostic evaluation Details: 25 G 1.5 in needle, anterolateral approach  Arthrogram: No  Medications: 2 mL lidocaine 2 %; 2 mL bupivacaine 0.5 %; 80 mg methylPREDNISolone acetate 40 MG/ML Consent was given by the patient. Immediately prior to procedure a time out was called to verify the correct patient, procedure, equipment, support staff and site/side marked as required. Patient was prepped and draped in the usual sterile fashion.       Clinical Data: No additional findings.   Subjective: No chief complaint on file. Pamela Mendez is 53 years old and visits the office with about a 78-month history of atraumatic right shoulder pain.  She simply woke up one morning and was unable to raise her arm over her head.  He has tried ice and heat.  IcyHot, the prednisone that she has had on a chronic basis for her back problems and still having discomfort.  She was given a prednisone dosage by her family physician but she is "retaining fluid" and the medicine was discontinued.  She does have a history of congestive heart  failure.  She specifically notes that her right shoulder hurts to move it forward and raising it "up".  She has not had any numbness or tingling.  She is not had any swelling or skin changes.  Medicines include potassium, Demadex, lisinopril, amlodipine, pravastatin and hydrocodone 7.5/325 she also takes cetirizin.  HPI  Review of Systems   Objective: Vital Signs: There were no vitals taken for this visit.  Physical Exam Constitutional:      Appearance: She is well-developed.  Eyes:     Pupils: Pupils are equal, round, and reactive to light.  Pulmonary:     Effort: Pulmonary effort is normal.  Skin:    General: Skin is warm and dry.  Neurological:     Mental Status: She is alert and oriented to person, place, and time.  Psychiatric:        Behavior: Behavior normal.     Ortho Exam blood pressure was 134/106.  Pulse was 96.  Weight 206 she is 5 feet 6 inches tall.  Only able to flex her right shoulder about 90 degrees and abduct about the same.  Considerable pain with internal and external rotation without crepitation.  Because of her pain she was unable to raise her arm over her head.  Skin intact.  Biceps intact.  After the injection  I could easily place her arm fully overhead little  discomfort.  Specialty Comments:  No specialty comments available.  Imaging: Ct Temporal Bones Wo Contrast  Result Date: 11/20/2018 CLINICAL DATA:  Cholesteatoma. Left ear pain and decreased hearing for 6 months EXAM: CT TEMPORAL BONES WITHOUT CONTRAST TECHNIQUE: Axial and coronal plane CT imaging of the petrous temporal bones was performed with thin-collimation image reconstruction. No intravenous contrast was administered. Multiplanar CT image reconstructions were also generated. COMPARISON:  None. FINDINGS: Right temporal bone: The pina and external auditory canal are unremarkable. The tympanic membrane is thin. A tympanostomy tube is present. The ossicles are normally formed and aligned. Mastoid  and middle ear is well pneumatized. Minimal opacification of mastoid air cells at the tip. The labyrinthine structures appear normally formed and bony covered. Internal auditory canal is normal in size. The vestibular aqueduct is normal in size. The carotid and sigmoid sinus are bone covered. Unremarkable facial nerve canal. Left temporal bone: The pina and external auditory canal are unremarkable. The tympanic membrane is not well evaluated due to adjacent soft tissue density. It is difficult to visualize the lenticular process with this area instead showing soft tissue density. Mastoid and middle ear is well pneumatized. There is a soft tissue density mass centered at Prussak space with scutum erosion and widening of Prussak space. Thin appearance of the tegmen tympani with possible areas of dehiscence. The mastoid is clear. No otic capsule erosion. Internal auditory canal is normal in size. The vestibular aqueduct is normal in size. The carotid and sigmoid sinus are bone covered. Unremarkable facial nerve canal. Hypertrophic appearance of the adenoid. IMPRESSION: 1. 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 There is extension to the tegmen tympani which is imperceptible in areas. 2. Right tympanostomy tube with minimal opacification of mastoid air cells. No right-sided erosion. 3. Hypertrophic adenoids. Electronically Signed   By: Marnee Spring M.D.   On: 11/20/2018 08:30     PMFS History: Patient Active Problem List   Diagnosis Date Noted   HTN (hypertension) 12/08/2016   Sinusitis 06/27/2016   Sciatica of right side 01/04/2016   Hypokalemia 10/07/2015   Migraine headache without aura 03/31/2015   GERD (gastroesophageal reflux disease) 02/04/2015   Chronic back pain 05/13/2013   Allergic rhinitis 12/31/2012   EDEMA 11/09/2010   Hyperlipidemia 01/17/2010   DEPRESSION/ANXIETY 01/17/2010   CARDIOMYOPATHY, MILD 01/17/2010   ABDOMINAL  PAIN, UNSPECIFIED SITE 01/17/2010   Past Medical History:  Diagnosis Date   Abdominal pain    Unspecified site   Anxiety    Anxiety and depression    Cardiomyopathy 2004   mild, post-partum   CHF (congestive heart failure) (HCC) 2004   post-partum   Chronic back pain    Chronic left hip pain    Depression    Gastritis    GERD (gastroesophageal reflux disease)    H/O echocardiogram 2012   EF 55-60%   Hyperlipidemia    Hypertension    Migraine headache without aura    Vertigo     Family History  Problem Relation Age of Onset   Heart attack Father    Anesthesia problems Neg Hx    Hypotension Neg Hx    Malignant hyperthermia Neg Hx    Pseudochol deficiency Neg Hx     Past Surgical History:  Procedure Laterality Date   ABLATION     Childbirth     time 3   CHOLECYSTECTOMY  2005   [facility] , Dr Katrinka Blazing   LUMBAR [facility] SURGERY  2010  [facility], Dr Marikay Alar   LUMBAR [facility] SURGERY  2005   [place], Dr Marikay Alar   Social History   Occupational History   Not on file  Tobacco Use   Smoking status: Former Smoker    Packs/day: 0.00    Years: 15.00    Pack years: 0.00    Types: Cigarettes    Last attempt to quit: 08/23/2013    Years since quitting: 5.2   Smokeless tobacco: Never Used  Substance and Sexual Activity   Alcohol use: No   Drug use: No   Sexual activity: Yes    Birth control/protection: Surgical    Comment: ablation     Valeria Batman, MD   Note - This record has been created using [employer].  Chart creation errors have been sought, but may not always  have been located. Such creation errors do not reflect on  the standard of medical care.

## 2018-12-04 ENCOUNTER — Ambulatory Visit: Payer: Medicaid Other | Admitting: Family Medicine

## 2018-12-04 ENCOUNTER — Other Ambulatory Visit: Payer: Self-pay

## 2018-12-04 ENCOUNTER — Ambulatory Visit (INDEPENDENT_AMBULATORY_CARE_PROVIDER_SITE_OTHER): Payer: Medicaid Other | Admitting: Family Medicine

## 2018-12-04 DIAGNOSIS — M5440 Lumbago with sciatica, unspecified side: Secondary | ICD-10-CM

## 2018-12-04 DIAGNOSIS — J0181 Other acute recurrent sinusitis: Secondary | ICD-10-CM | POA: Diagnosis not present

## 2018-12-04 DIAGNOSIS — I10 Essential (primary) hypertension: Secondary | ICD-10-CM | POA: Diagnosis not present

## 2018-12-04 DIAGNOSIS — E7849 Other hyperlipidemia: Secondary | ICD-10-CM | POA: Diagnosis not present

## 2018-12-04 DIAGNOSIS — G8929 Other chronic pain: Secondary | ICD-10-CM

## 2018-12-04 MED ORDER — DOXYCYCLINE HYCLATE 100 MG PO TABS: 100.0000 mg | ORAL_TABLET | Freq: Two times a day (BID) | ORAL | 0 refills | Status: DC

## 2018-12-04 MED ORDER — HYDROCODONE-ACETAMINOPHEN 10-325 MG PO TABS: ORAL_TABLET | ORAL | 0 refills | Status: DC

## 2018-12-04 NOTE — Progress Notes (Signed)
Subjective:    Patient ID: Pamela Mendez, female    DOB: 12/25/1965, 53 y.o.   MRN: [id number]  HPI virtual visit with video capabilities completed today   this patient was seen today for chronic pain. Takes for back pain.   The medication list was reviewed and updated.   -Compliance with medication: takes 4 per day  - Number patient states they take daily: 4 per day  -when was the last dose patient took? Took one this morning  The patient was advised the importance of maintaining medication and not using illegal substances with these.  Here for refills and follow up  The patient was educated that we can provide 3 monthly scripts for their medication, it is their responsibility to follow the instructions.  Side effects or complications from medications: none  Patient is aware that pain medications are meant to minimize the severity of the pain to allow their pain levels to improve to allow for better function. They are aware of that pain medications cannot totally remove their pain.  Due for UDT ( at least once per year) : last one 06/06/18.   Sinus symptoms. Stuffy nose, pain behind right eye. Started about one week ago off and on.  No fever, no sob. Tried mucinex and it helps a little.  Patient states that she was able to get good relief of her pain with medication without the medication she is unable to function  She also relates significant sinus symptoms sinus pressure drainage discolored drainage think she has a sinus infection been going on for the past week  Also taking her blood pressure medicine as directed not having any problems with the medicine.  Tolerating medicine well.  Blood pressure readings at home of been good  Allergy taking allergy medicines as directed but does seem to do a good job for her  Patient is taking her cholesterol medicine on a regular basis tolerating well recent lab work reviewed with patient no current lab work ordered [employer] via  Telephone Note Video completed as well I connected with Pamela Mendez on 12/04/18 at  9:30 AM EDT by telephone and verified that I am speaking with the correct person using two identifiers.   I discussed the limitations, risks, security and privacy concerns of performing an evaluation and management service by telephone and the availability of in person appointments. I also discussed with the patient that there may be a patient responsible charge related to this service. The patient expressed understanding and agreed to proceed.   History of Present Illness:    Observations/Objective:   Assessment and Plan:   Follow Up Instructions:    I discussed the assessment and treatment plan with the patient. The patient was provided an opportunity to ask questions and all were answered. The patient agreed with the plan and demonstrated an understanding of the instructions.   The patient was advised to call back or seek an in-person evaluation if the symptoms worsen or if the condition fails to improve as anticipated.  I provided 25 minutes of non-face-to-face time during this encounter.   Kyra Manges, LPN        Review of Systems     Objective:   Physical Exam        Assessment & Plan:  HTN watch diet continue healthy diet continue regular physical activity.  Continue medication  Sinusitis antibiotic prescribed warning signs discussed follow-up if problem  Hyperlipidemia continue current medications no current labs ordered  new labs will be done before next visit in 3 months  Chronic back pain she does well with her pain medicine we will continue it drug registry checked 3 prescription sent in  Follow-up 3 months lab work at that time as well  25 minutes was spent with the patient.  This statement verifies that 25 minutes was indeed spent with the patient.  More than 50% of this visit-total duration of the visit-was spent in counseling and coordination of care.  The issues that the patient came in for today as reflected in the diagnosis (s) please refer to documentation for further details.

## 2019-01-15 ENCOUNTER — Other Ambulatory Visit: Payer: Self-pay | Admitting: Family Medicine

## 2019-01-15 ENCOUNTER — Telehealth: Payer: Self-pay | Admitting: Family Medicine

## 2019-01-15 MED ORDER — METHOCARBAMOL 500 MG PO TABS: ORAL_TABLET | ORAL | 3 refills | Status: DC

## 2019-01-15 NOTE — Telephone Encounter (Signed)
Patient is requesting refill on methocarbamol 500 mg last filled 06/06/18. [employer]

## 2019-01-15 NOTE — Telephone Encounter (Signed)
Last seen 12/04/2018 for HTN. Please advise. Thank you

## 2019-01-15 NOTE — Telephone Encounter (Signed)
Medication sent to pharmacy. Left message to make pt aware.

## 2019-01-15 NOTE — Telephone Encounter (Signed)
Pt also called office

## 2019-01-15 NOTE — Telephone Encounter (Signed)
Pt returned called and was made aware medication has been sent to pharmacy

## 2019-01-15 NOTE — Telephone Encounter (Signed)
May have this +3 refills 

## 2019-02-11 ENCOUNTER — Other Ambulatory Visit (HOSPITAL_COMMUNITY): Payer: Self-pay | Admitting: Family Medicine

## 2019-02-11 ENCOUNTER — Other Ambulatory Visit: Payer: Self-pay | Admitting: Family Medicine

## 2019-02-11 DIAGNOSIS — Z1231 Encounter for screening mammogram for malignant neoplasm of breast: Secondary | ICD-10-CM

## 2019-02-19 ENCOUNTER — Encounter (HOSPITAL_COMMUNITY)
Admission: RE | Admit: 2019-02-19 | Discharge: 2019-02-19 | Disposition: A | Discharge status: Home / Self Care | Payer: Medicaid Other | Source: Ambulatory Visit | Attending: Family Medicine | Admitting: Family Medicine

## 2019-02-19 ENCOUNTER — Other Ambulatory Visit: Payer: Self-pay

## 2019-02-19 DIAGNOSIS — Z1231 Encounter for screening mammogram for malignant neoplasm of breast: Secondary | ICD-10-CM | POA: Diagnosis not present

## 2019-03-03 ENCOUNTER — Ambulatory Visit (INDEPENDENT_AMBULATORY_CARE_PROVIDER_SITE_OTHER): Payer: Medicaid Other | Admitting: Family Medicine

## 2019-03-03 ENCOUNTER — Other Ambulatory Visit: Payer: Self-pay

## 2019-03-03 DIAGNOSIS — Z79899 Other long term (current) drug therapy: Secondary | ICD-10-CM

## 2019-03-03 DIAGNOSIS — J0181 Other acute recurrent sinusitis: Secondary | ICD-10-CM

## 2019-03-03 DIAGNOSIS — Z79891 Long term (current) use of opiate analgesic: Secondary | ICD-10-CM | POA: Diagnosis not present

## 2019-03-03 DIAGNOSIS — G8929 Other chronic pain: Secondary | ICD-10-CM

## 2019-03-03 DIAGNOSIS — R7303 Prediabetes: Secondary | ICD-10-CM | POA: Diagnosis not present

## 2019-03-03 DIAGNOSIS — I10 Essential (primary) hypertension: Secondary | ICD-10-CM | POA: Diagnosis not present

## 2019-03-03 DIAGNOSIS — M5440 Lumbago with sciatica, unspecified side: Secondary | ICD-10-CM | POA: Diagnosis not present

## 2019-03-03 MED ORDER — HYDROCODONE-ACETAMINOPHEN 10-325 MG PO TABS: ORAL_TABLET | ORAL | 0 refills | Status: DC

## 2019-03-03 MED ORDER — TORSEMIDE 20 MG PO TABS: ORAL_TABLET | ORAL | 5 refills | Status: DC

## 2019-03-03 MED ORDER — PRAVASTATIN SODIUM 80 MG PO TABS: 80.0000 mg | ORAL_TABLET | Freq: Every day | ORAL | 1 refills | Status: DC

## 2019-03-03 MED ORDER — AMLODIPINE BESYLATE 5 MG PO TABS: 5.0000 mg | ORAL_TABLET | Freq: Every day | ORAL | 1 refills | Status: DC

## 2019-03-03 MED ORDER — DOXYCYCLINE HYCLATE 100 MG PO TABS: 100.0000 mg | ORAL_TABLET | Freq: Two times a day (BID) | ORAL | 0 refills | Status: DC

## 2019-03-03 MED ORDER — POTASSIUM CHLORIDE CRYS ER 20 MEQ PO TBCR: EXTENDED_RELEASE_TABLET | ORAL | 5 refills | Status: DC

## 2019-03-03 MED ORDER — PANTOPRAZOLE SODIUM 40 MG PO TBEC: 40.0000 mg | DELAYED_RELEASE_TABLET | Freq: Every day | ORAL | 1 refills | Status: DC

## 2019-03-03 MED ORDER — LISINOPRIL 40 MG PO TABS: 40.0000 mg | ORAL_TABLET | Freq: Every day | ORAL | 1 refills | Status: DC

## 2019-03-03 MED ORDER — GABAPENTIN 300 MG PO CAPS: ORAL_CAPSULE | ORAL | 5 refills | Status: DC

## 2019-03-03 NOTE — Progress Notes (Signed)
Subjective:    Patient ID: Pamela Mendez, female    DOB: [birth year], 53 y.o.   MRN: [id number]  HPI This patient was seen today for chronic pain Patient states pain medicine does take the edge off her pain allows her to function better without it she does a very difficult time functioning she denies any major setbacks recently  She does relate significant sinus pressure pain discomfort denies high fever chills sweats  Doing well with her other medications The medication list was reviewed and updated.   -Compliance with medication: yes  - Number patient states they take daily: 4  -when was the last dose patient took? today  The patient was advised the importance of maintaining medication and not using illegal substances with these.  Here for refills and follow up  The patient was educated that we can provide 3 monthly scripts for their medication, it is their responsibility to follow the instructions.  Side effects or complications from medications: none  Patient is aware that pain medications are meant to minimize the severity of the pain to allow their pain levels to improve to allow for better function. They are aware of that pain medications cannot totally remove their pain.  Patient also has a sinus infection and would like something for it      Virtual Visit via Video Note  I connected with Pamela Mendez on 03/03/19 at  9:30 AM EDT by a video enabled telemedicine application and verified that I am speaking with the correct person using two identifiers.  Location: Patient: home Provider: office   I discussed the limitations of evaluation and management by telemedicine and the availability of in person appointments. The patient expressed understanding and agreed to proceed.  History of Present Illness:    Observations/Objective:   Assessment and Plan:   Follow Up Instructions:    I discussed the assessment and treatment plan with the patient. The patient  was provided an opportunity to ask questions and all were answered. The patient agreed with the plan and demonstrated an understanding of the instructions.   The patient was advised to call back or seek an in-person evaluation if the symptoms worsen or if the condition fails to improve as anticipated.  I provided 15 minutes of non-face-to-face time during this encounter.     Review of Systems  Constitutional: Negative for activity change, appetite change and fatigue.  HENT: Negative for congestion and rhinorrhea.   Respiratory: Negative for cough and shortness of breath.   Cardiovascular: Negative for chest pain and leg swelling.  Gastrointestinal: Negative for abdominal pain and diarrhea.  Endocrine: Negative for polydipsia and polyphagia.  Musculoskeletal: Positive for back pain. Negative for gait problem.  Skin: Negative for color change.  Neurological: Negative for dizziness and weakness.  Psychiatric/Behavioral: Negative for behavioral problems and confusion.       Objective:   Physical Exam Patient had virtual visit Appears to be in no distress Atraumatic Neuro able to relate and oriented No apparent resp distress Color normal        Assessment & Plan:  The patient was seen in followup for chronic pain. A review over at their current pain status was discussed. Drug registry was checked. Prescriptions were given. Discussion was held regarding the importance of compliance with medication as well as pain medication contract.  Time for questions regarding pain management plan occurred. Importance of regular followup visits was discussed. Patient was informed that medication may cause drowsiness and should not  be combined  with other medications/alcohol or street drugs. Patient was cautioned that medication could cause drowsiness. If the patient feels medication is causing altered alertness then do not drive or operate dangerous equipment.  3 prescriptions were sent in   Patient will need comprehensive follow-up in 3 months virtual will be following lab work before that visit

## 2019-03-04 NOTE — Addendum Note (Signed)
Addended by: Vicente Males on: 03/04/2019 09:20 AM   Modules accepted: Orders

## 2019-03-04 NOTE — Progress Notes (Signed)
Labs orders placed and mailed to patient

## 2019-03-14 ENCOUNTER — Telehealth: Payer: Self-pay | Admitting: Family Medicine

## 2019-03-14 NOTE — Telephone Encounter (Signed)
PA needed for pt Hydrocodone 10-325 mg tablets. Take one qid for pain. Form in provider office for signature.

## 2019-03-17 NOTE — Telephone Encounter (Signed)
Form faxed

## 2019-03-17 NOTE — Telephone Encounter (Signed)
This was filled out 

## 2019-03-20 ENCOUNTER — Telehealth: Payer: Self-pay | Admitting: Family Medicine

## 2019-03-20 NOTE — Telephone Encounter (Signed)
Cont usual teeatment will gradually come back up   In future when adding addtnl norvasc would ec only an additional one haf tab

## 2019-03-20 NOTE — Telephone Encounter (Signed)
Patient woke up yest am and had a bad headache and her blood pressure was elevated so she took 2 of her norvaqsc 5mg (instead of her normal dose of one ) then a few hours later took her other BP pills which include lisinopril 40mg  and torsemide 20mg . Patient said yesterday evening her BP was running low 90s/60s and then this am she took her meds as prescribed( one pill of each) but BP was 97/77 just a few minutes ago     Please advise

## 2019-03-20 NOTE — Telephone Encounter (Signed)
Patient advised per Dr Richardson Landry: Continue usual treatment will gradually come back up.  In future when adding addtnl norvasc would recomend only an additional one half. Patient verbalized understanding.

## 2019-03-20 NOTE — Telephone Encounter (Signed)
2 days ago had a h/a and her blood pressure was  201/114 She said that Dr. Nicki Reaper told her when that happens take 2 of her bp meds so she did. Now only taking one pill but her blood pressure is running low, 98/77 and her pulse is running high 100-114. Needs advice

## 2019-03-21 ENCOUNTER — Telehealth: Payer: Self-pay | Admitting: *Deleted

## 2019-03-21 NOTE — Telephone Encounter (Signed)
Received denial of prior approval for patient's pain medication-denied 03/17/2019.  [facility] is requesting a detailed plan of care regarding the patient's pain medication as well a clinical document of a plan of continuing care for pain management.(last pain management office note was faxed with original prior auth forms but they are also requesting the above notes)

## 2019-03-23 NOTE — Telephone Encounter (Signed)
  So I consider this a bogus move by Medicaid but there is nothing I can do about that So therefore Nurses- please contact the patient.  Please explain we are running into roadblocks which is becoming very common with Medicaid I would recommend the following -The patient will need a office visit-here this week -If the patient is hesitant about coming in this could be done at her car -Essentially the visit will consist of me talking with our as well as having her fill out a few forms to provide additional information so that I can file additional papers with Medicaid and hopefully get their blessings -The patient will need to fill out a pain assessment questionnaire when she comes-I will print this out for you to give to her when she comes for her visit Please let me know thank you

## 2019-03-24 NOTE — Telephone Encounter (Signed)
Pt contacted and verbalized understanding. Pt transferred up front to schedule office visit this week.

## 2019-03-24 NOTE — Telephone Encounter (Signed)
Pt has office visit 03/25/2019 at 3 pm.

## 2019-03-25 ENCOUNTER — Other Ambulatory Visit: Payer: Self-pay

## 2019-03-25 ENCOUNTER — Encounter: Payer: Self-pay | Admitting: Family Medicine

## 2019-03-25 ENCOUNTER — Ambulatory Visit (INDEPENDENT_AMBULATORY_CARE_PROVIDER_SITE_OTHER): Payer: Medicaid Other | Admitting: Family Medicine

## 2019-03-25 VITALS — Temp 98.4°F | Ht 66.0 in | Wt 210.0 lb

## 2019-03-25 DIAGNOSIS — G8929 Other chronic pain: Secondary | ICD-10-CM

## 2019-03-25 DIAGNOSIS — Z79891 Long term (current) use of opiate analgesic: Secondary | ICD-10-CM

## 2019-03-25 DIAGNOSIS — M5440 Lumbago with sciatica, unspecified side: Secondary | ICD-10-CM | POA: Diagnosis not present

## 2019-03-25 DIAGNOSIS — G894 Chronic pain syndrome: Secondary | ICD-10-CM | POA: Diagnosis not present

## 2019-03-25 NOTE — Progress Notes (Signed)
Subjective:    Patient ID: Pamela Mendez, female    DOB: [birth year], 53 y.o.   MRN: [id number] In person visit HPI Patient arrives for updated pain assessment and care management plan  It should be noted that the patient has followed here for pain management for well over 6 years.  She is followed every CDC guideline.  She does regular urine drug checks.  We also monitor the prescription PDMP network.  I have discussed with patient other options including physical therapy-for which her insurance unfortunately does not pay for.  Also discussed referral to other specialist.  This patient has had 2 previous surgeries but does not have any indication for surgery currently.  We also discussed home care that she can do as well.  She has tried anti-inflammatories gabapentin and Tylenol and none of these give her enough relief to allow her to function at a suitable level.  She has been cautioned regarding the dangers of opioids.  We have also told her with our guidelines we keep below the CDC recommendation regarding milligrams per day.  We also have established a pain contract with her and have done so successfully over the past several years.  This patient has a longstanding history of low back pain.  She has been through 2 surgeries in 2010 and in 2015. She has known degenerative disc disease as well as herniated disks and previous laminectomy.  Patient suffers with chronic low back pain with radiation down the left leg. Patient has tried anti-inflammatories without adequate relief of her pain Patient is on gabapentin on a regular basis and has been so for many years. The patient has tried physical therapy in the past but currently is on Medicaid which does not pay for ongoing physical therapy and patient cannot afford to pay for physical therapy out-of-pocket The patient does home stretches and home exercises on a regular basis to help herself The patient also does massage at home and heating pads  The patient cannot afford a massage therapist The patient has been on hydrocodone for the past several years.  Her pill counts have always been accurate as well as she is never had any issues arise on urine drug screens She has not had any issues arise on review of pharmaceutical prescription filling records The PDMP has been reviewed on a regular basis every time she comes in.  She is consistent with her accuracy. Without the medication she has a very difficult time doing chores at her house and light yard work.  The patient is disabled and does not work.  Without the pain medicine the pain is at a level that is very disruptive to her quality of life.  It is been necessary for her to be on medicine. She has been under the milligram guidelines for pain. She has had a opioid assessment tool which was negative. She has had a depression PHQ 9 which was negative. She has had pain management assessment done on a regular basis and documented thoroughly with this visit It is also noted that the patient is under a pain management contract and follows it directly with [place] and does not get prescriptions from any other sources  Patient arrives to discuss pain management and discuss her plan of care and long term goals for pain management. Patient currently on Hydrocodone 10/39m one QID #120 a month Review of Systems  Constitutional: Negative for activity change and appetite change.  HENT: Negative for congestion and rhinorrhea.   Respiratory: Negative  for cough and shortness of breath.   Cardiovascular: Negative for chest pain and leg swelling.  Gastrointestinal: Negative for abdominal pain, nausea and vomiting.  Musculoskeletal: Positive for arthralgias and back pain.  Skin: Negative for color change.  Neurological: Negative for dizziness and weakness.  Psychiatric/Behavioral: Negative for agitation and confusion.       Objective:   Physical Exam Vitals signs reviewed.  Constitutional:       General: She is not in acute distress. HENT:     Head: Normocephalic and atraumatic.  Eyes:     General:        Right eye: No discharge.        Left eye: No discharge.  Neck:     Trachea: No tracheal deviation.  Cardiovascular:     Rate and Rhythm: Normal rate and regular rhythm.     Heart sounds: Normal heart sounds. No murmur.  Pulmonary:     Effort: Pulmonary effort is normal. No respiratory distress.     Breath sounds: Normal breath sounds.  Lymphadenopathy:     Cervical: No cervical adenopathy.  Skin:    General: Skin is warm and dry.  Neurological:     Mental Status: She is alert.     Coordination: Coordination normal.  Psychiatric:        Behavior: Behavior normal.    Patient has subjective discomfort in her lower back with positive straight leg raise on the left has difficulty getting onto and off the exam table but otherwise can function appropriately   40 minutes was spent with the patient.  This statement verifies that 40 minutes was indeed spent with the patient.  More than 50% of this visit-total duration of the visit-was spent in counseling and coordination of care. The issues that the patient came in for today as reflected in the diagnosis (s) please refer to documentation for further details.     Assessment & Plan:  Pain management assessment This patient has met heavy criteria under the CDC guidelines for responsible prescribing of pain medicine She does have a pain contract here She has tried anti-inflammatories which do not do enough Please see documentation above She is also on gabapentin She is tried physical therapy in the past but her insurance does not pay for ongoing physical therapy She has had 2 previous surgeries and has chronic pain syndrome as a result of her chronic back troubles Her pain medication does do a good job of controlling her discomfort She is under the milligram guidelines The drug registry is checked on a regular basis She gets  regular urine drug screens which have been negative for illicit substances Opioid misuse survey was negative PHQ 9-  Unfortunately Medicaid has made it very difficult for this patient to get these medically necessary medicines.  This patient is very compliant with her medicine and is not drug-seeking.  This medication is medically necessary to help her cope with her level of pain.  It is irresponsible for Medicaid to deny her prescriptions. We will submit additional information Unfortunately Medicaid is not a easy insurance to deal with and they operate by reviewing documents rather than talking with providers.  Hopefully they will do the right thing and approved this patient's medications.  This patient is legitimate in her need for pain medicine and all of the CDC recommendations are being followed for this patient.  40 minutes was spent with the patient.  This statement verifies that 40 minutes was indeed spent with the patient.  More than 50% of this visit-total duration of the visit-was spent in counseling and coordination of care. The issues that the patient came in for today as reflected in the diagnosis (s) please refer to documentation for further details.

## 2019-03-26 ENCOUNTER — Telehealth: Payer: Self-pay | Admitting: Family Medicine

## 2019-03-26 NOTE — Telephone Encounter (Signed)
Nurses Yesterday I did a comprehensive visit for pain management There are multiple forms the patient filled out plus also my dictation as well as the appeal form for her medication Please work with Vaughan Basta at [employer] to reinitiate approval of this patient's pain medicine Thank you

## 2019-03-26 NOTE — Telephone Encounter (Signed)
Spoke with Vaughan Basta at [employer]. Vaughan Basta states the [employer] form has been updated; she is faxing over a new one for [place] to fill out. Vaughan Basta states to make sure on the question about is it less than or equal to 90mg  of morphine or an equivalent to check no. All other information is the same. Will fill out and send to provider for signature, then will fax to [facility].

## 2019-03-26 NOTE — Telephone Encounter (Signed)
Left message for Linda to return call

## 2019-03-27 NOTE — Telephone Encounter (Signed)
Additional forms were filled out.  This was given to the staff/nurses and hopefully the situation will get corrected

## 2019-03-28 NOTE — Telephone Encounter (Signed)
Form faxed to [place] [facility]. 

## 2019-03-28 NOTE — Telephone Encounter (Signed)
This was completed please fax all information and thank you

## 2019-04-01 ENCOUNTER — Telehealth: Payer: Self-pay | Admitting: Family Medicine

## 2019-04-01 NOTE — Telephone Encounter (Signed)
Spoke with pharmacist(Phillip) at [facility] who stated the medication went thru insurance with a $3 dollar copay- Patient notified and verbalized understanding.

## 2019-04-01 NOTE — Telephone Encounter (Signed)
Pt is checking on status of PA for pain medicine

## 2019-04-29 ENCOUNTER — Other Ambulatory Visit: Payer: Self-pay

## 2019-04-29 ENCOUNTER — Ambulatory Visit (INDEPENDENT_AMBULATORY_CARE_PROVIDER_SITE_OTHER): Payer: Medicaid Other | Admitting: Family Medicine

## 2019-04-29 ENCOUNTER — Encounter: Payer: Self-pay | Admitting: Family Medicine

## 2019-04-29 DIAGNOSIS — J019 Acute sinusitis, unspecified: Secondary | ICD-10-CM

## 2019-04-29 DIAGNOSIS — B9689 Other specified bacterial agents as the cause of diseases classified elsewhere: Secondary | ICD-10-CM

## 2019-04-29 MED ORDER — DOXYCYCLINE HYCLATE 100 MG PO TABS: ORAL_TABLET | ORAL | 0 refills | Status: DC

## 2019-04-29 NOTE — Progress Notes (Signed)
   Subjective:  Audio plus video  Patient ID: Pamela Mendez, female    DOB: [birth year], 53 y.o.   MRN: [id number]  Sinusitis This is a new problem. The current episode started in the past 7 days. (Sinus pressure behind right eye, stuffy nose. ) Treatments tried: Advil Congestion. The treatment provided mild relief.   Virtual Visit via Video Note  I connected with Pamela Mendez on 04/29/19 at 11:00 AM EDT by a video enabled telemedicine application and verified that I am speaking with the correct person using two identifiers.  Location: Patient: home Provider: office   I discussed the limitations of evaluation and management by telemedicine and the availability of in person appointments. The patient expressed understanding and agreed to proceed.  History of Present Illness:    Observations/Objective:   Assessment and Plan:   Follow Up Instructions:    I discussed the assessment and treatment plan with the patient. The patient was provided an opportunity to ask questions and all were answered. The patient agreed with the plan and demonstrated an understanding of the instructions.   The patient was advised to call back or seek an in-person evaluation if the symptoms worsen or if the condition fails to improve as anticipated.  I provided 18 minutes of non-face-to-face time during this encounter. Patient states this is exactly like her usual sinus infection right frontal.  Over her eyebrow.  Deep ache.  Worse with cough or sneezing.  Some productive nasal discharge.  No sore throat no cough no chest pain no shortness of breath no fever  Vicente Males, LPN    Review of Systems See above    Objective:   Physical Exam   Virtual     Assessment & Plan:  Impression rhinosinusitis likely post viral, discussed with patient. plan antibiotics prescribed. Questions answered. Symptomatic care discussed. warning signs discussed. [employer] Doubt COVID-19 discussed at length we will press  on cover with antibiotics

## 2019-05-05 ENCOUNTER — Other Ambulatory Visit: Payer: Self-pay | Admitting: Family Medicine

## 2019-06-05 ENCOUNTER — Other Ambulatory Visit: Payer: Self-pay | Admitting: Family Medicine

## 2019-06-06 NOTE — Telephone Encounter (Signed)
May have 6 months refill on all of these

## 2019-06-19 ENCOUNTER — Ambulatory Visit (INDEPENDENT_AMBULATORY_CARE_PROVIDER_SITE_OTHER): Payer: Medicaid Other | Admitting: Family Medicine

## 2019-06-19 ENCOUNTER — Telehealth: Payer: Self-pay | Admitting: Family Medicine

## 2019-06-19 ENCOUNTER — Other Ambulatory Visit: Payer: Self-pay | Admitting: *Deleted

## 2019-06-19 DIAGNOSIS — B9689 Other specified bacterial agents as the cause of diseases classified elsewhere: Secondary | ICD-10-CM | POA: Diagnosis not present

## 2019-06-19 DIAGNOSIS — J019 Acute sinusitis, unspecified: Secondary | ICD-10-CM

## 2019-06-19 MED ORDER — OLOPATADINE HCL 0.2 % OP SOLN: 1.0000 [drp] | Freq: Every evening | OPHTHALMIC | 4 refills | Status: AC | PRN

## 2019-06-19 MED ORDER — DOXYCYCLINE HYCLATE 100 MG PO TABS: 100.0000 mg | ORAL_TABLET | Freq: Two times a day (BID) | ORAL | 0 refills | Status: DC

## 2019-06-19 NOTE — Telephone Encounter (Signed)
Pt notified on voicemail  °

## 2019-06-19 NOTE — Telephone Encounter (Signed)
May have 4 refills 

## 2019-06-19 NOTE — Telephone Encounter (Signed)
Pt wanted a refill on olopatadine eye drops.

## 2019-06-19 NOTE — Telephone Encounter (Signed)
Pt had virtual visit this morning with Dr. Richardson Landry, pt states the antibiotic was sent in but NOT the eye drops  Please advise & call pt when done     [facility]

## 2019-06-19 NOTE — Progress Notes (Signed)
   Subjective:  Audio plus video  Patient ID: Pamela Mendez, female    DOB: 11/19/1965, 53 y.o.   MRN: [id number]  Sinusitis This is a new problem. Episode onset: 2 day. There has been no fever. Associated symptoms include sinus pressure. Treatments tried: mucinex.   Needs refill on olopatadine eye drops.   Virtual Visit via Telephone Note  I connected with [name] on 06/19/19 at 10:30 AM EDT by telephone and verified that I am speaking with the correct person using two identifiers.  Location: Patient: home Provider: office   I discussed the limitations, risks, security and privacy concerns of performing an evaluation and management service by telephone and the availability of in person appointments. I also discussed with the patient that there may be a patient responsible charge related to this service. The patient expressed understanding and agreed to proceed.   History of Present Illness:    Observations/Objective:   Assessment and Plan:   Follow Up Instructions:    I discussed the assessment and treatment plan with the patient. The patient was provided an opportunity to ask questions and all were answered. The patient agreed with the plan and demonstrated an understanding of the instructions.   The patient was advised to call back or seek an in-person evaluation if the symptoms worsen or if the condition fails to improve as anticipated. 17 minutes of non-face-to-face time during this encounter.  Patient claims this is one of her typical classic sinus infection.  Frontal headache.  Associated congestion nasal discharge.  No sore throat.  No cough.  No shortness of breath.  No fever.  Patient has not been around anyone else sick.  She is using high cautions to remain relatively isolated    Review of Systems  HENT: Positive for sinus pressure.   No vomiting no diarrhea     Objective:   Physical Exam  Virtual      Assessment & Plan:  Impression probable  rhinosinusitis.  Antibiotics prescribed symptom care discussed.  Cautioned if anyone else becomes sick at home or patient's illness expands to contact us.  Symptom care discussed.  Warning signs discussed

## 2019-06-23 ENCOUNTER — Ambulatory Visit (INDEPENDENT_AMBULATORY_CARE_PROVIDER_SITE_OTHER): Payer: Medicaid Other | Admitting: Family Medicine

## 2019-06-23 DIAGNOSIS — I10 Essential (primary) hypertension: Secondary | ICD-10-CM | POA: Diagnosis not present

## 2019-06-23 DIAGNOSIS — M5440 Lumbago with sciatica, unspecified side: Secondary | ICD-10-CM | POA: Diagnosis not present

## 2019-06-23 DIAGNOSIS — G894 Chronic pain syndrome: Secondary | ICD-10-CM | POA: Diagnosis not present

## 2019-06-23 DIAGNOSIS — G8929 Other chronic pain: Secondary | ICD-10-CM

## 2019-06-23 DIAGNOSIS — K219 Gastro-esophageal reflux disease without esophagitis: Secondary | ICD-10-CM

## 2019-06-23 DIAGNOSIS — E876 Hypokalemia: Secondary | ICD-10-CM

## 2019-06-23 MED ORDER — HYDROCODONE-ACETAMINOPHEN 10-325 MG PO TABS: ORAL_TABLET | ORAL | 0 refills | Status: DC

## 2019-06-23 MED ORDER — AMLODIPINE BESYLATE 5 MG PO TABS: 5.0000 mg | ORAL_TABLET | Freq: Every day | ORAL | 1 refills | Status: DC

## 2019-06-23 MED ORDER — GABAPENTIN 300 MG PO CAPS: ORAL_CAPSULE | ORAL | 5 refills | Status: DC

## 2019-06-23 NOTE — Progress Notes (Signed)
Subjective:    Patient ID: Pamela Mendez, female    DOB: 06-09-1966, 53 y.o.   MRN: [id number]  HPI This patient was seen today for chronic pain  The medication list was reviewed and updated.   -Compliance with medication: Hydrocodone 10-325  - Number patient states they take daily: 4; taking regularly    -when was the last dose patient took?  8 pm last night  The patient was advised the importance of maintaining medication and not using illegal substances with these.  Here for refills and follow up  The patient was educated that we can provide 3 monthly scripts for their medication, it is their responsibility to follow the instructions.  Side effects or complications from medications: none  Patient is aware that pain medications are meant to minimize the severity of the pain to allow their pain levels to improve to allow for better function. They are aware of that pain medications cannot totally remove their pain.  Due for UDT ( at least once per year) : last one done 06/06/2018 She states the pain medicine has been helping.  She takes it approximately 4 times a day denies abusing it.  She is able to take her gabapentin on a regular basis without having any drowsiness with it does help her with her sciatic discomfort  No lab work indicated on today's visit. She is taking her pravastatin on daily basis for cholesterol feels that it is doing well She states her blood pressure doing well taking her medicine on a regular basis   Virtual Visit via Telephone Note  I connected with Pamela Mendez on 06/23/19 at  9:00 AM EDT by telephone and verified that I am speaking with the correct person using two identifiers.  Location: Patient: home Provider: office   I discussed the limitations, risks, security and privacy concerns of performing an evaluation and management service by telephone and the availability of in person appointments. I also discussed with the patient that there  may be a patient responsible charge related to this service. The patient expressed understanding and agreed to proceed.   History of Present Illness:    Observations/Objective:   Assessment and Plan:   Follow Up Instructions:    I discussed the assessment and treatment plan with the patient. The patient was provided an opportunity to ask questions and all were answered. The patient agreed with the plan and demonstrated an understanding of the instructions.   The patient was advised to call back or seek an in-person evaluation if the symptoms worsen or if the condition fails to improve as anticipated.  I provided 17 minutes of non-face-to-face time during this encounter.   Marlowe [place], LPN     Review of Systems  Constitutional: Negative for activity change, appetite change and fatigue.  HENT: Negative for congestion and rhinorrhea.   Respiratory: Negative for cough and shortness of breath.   Cardiovascular: Negative for chest pain and leg swelling.  Gastrointestinal: Negative for abdominal pain and diarrhea.  Endocrine: Negative for polydipsia and polyphagia.  Musculoskeletal: Positive for arthralgias and back pain.  Skin: Negative for color change.  Neurological: Negative for dizziness and weakness.  Psychiatric/Behavioral: Negative for behavioral problems and confusion.       Objective:   Physical Exam  Today's visit was via telephone Physical exam was not possible for this visit       Assessment & Plan:  The patient was seen in followup for chronic pain. A review over  at their current pain status was discussed. Drug registry was checked. Prescriptions were given. Discussion was held regarding the importance of compliance with medication as well as pain medication contract.  Time for questions regarding pain management plan occurred. Importance of regular followup visits was discussed. Patient was informed that medication may cause drowsiness and should not  be combined  with other medications/alcohol or street drugs. Patient was cautioned that medication could cause drowsiness. If the patient feels medication is causing altered alertness then do not drive or operate dangerous equipment.  Pain prescriptions were written out today.  Patient will follow up as needed otherwise see her back in 3 months  Blood pressure good control continue current measures  Hyperlipidemia lab work on next visit

## 2019-06-25 ENCOUNTER — Other Ambulatory Visit (INDEPENDENT_AMBULATORY_CARE_PROVIDER_SITE_OTHER): Payer: Medicaid Other | Admitting: *Deleted

## 2019-06-25 ENCOUNTER — Other Ambulatory Visit: Payer: Self-pay

## 2019-06-25 DIAGNOSIS — Z23 Encounter for immunization: Secondary | ICD-10-CM | POA: Diagnosis not present

## 2019-07-04 ENCOUNTER — Other Ambulatory Visit: Payer: Self-pay | Admitting: Family Medicine

## 2019-07-06 ENCOUNTER — Other Ambulatory Visit: Payer: Self-pay | Admitting: Family Medicine

## 2019-07-06 NOTE — Telephone Encounter (Signed)
On the patient's last visit I sent in 3 prescriptions for pain medicine that could be filled for the coming months including this 1 So therefore duplicate

## 2019-07-09 ENCOUNTER — Other Ambulatory Visit: Payer: Self-pay

## 2019-07-09 ENCOUNTER — Ambulatory Visit (INDEPENDENT_AMBULATORY_CARE_PROVIDER_SITE_OTHER): Payer: Medicaid Other | Admitting: Family Medicine

## 2019-07-09 DIAGNOSIS — J019 Acute sinusitis, unspecified: Secondary | ICD-10-CM | POA: Diagnosis not present

## 2019-07-09 DIAGNOSIS — B9689 Other specified bacterial agents as the cause of diseases classified elsewhere: Secondary | ICD-10-CM | POA: Diagnosis not present

## 2019-07-09 MED ORDER — DOXYCYCLINE HYCLATE 100 MG PO TABS: 100.0000 mg | ORAL_TABLET | Freq: Two times a day (BID) | ORAL | 0 refills | Status: DC

## 2019-07-09 NOTE — Progress Notes (Signed)
   Subjective:    Patient ID: Pamela Mendez, female    DOB: 01-10-1966, 53 y.o.   MRN: [id number]  Sinus Problem      Review of Systems     Objective:   Physical Exam        Assessment & Plan:

## 2019-07-09 NOTE — Progress Notes (Signed)
   Subjective:    Patient ID: Pamela Mendez, female    DOB: [birth year], 53 y.o.   MRN: [id number]  Sinus Problem This is a new problem. The current episode started in the past 7 days. Associated symptoms include congestion, coughing and sinus pressure. Pertinent negatives include no ear pain or shortness of breath. Treatments tried: advil congestion and mucinex.   Patient with head congestion drainage coughing no high fever chills sweats no wheezing difficulty breathing.  Denies any chest pain denies nausea vomiting diarrhea   Review of Systems  Constitutional: Negative for activity change and fever.  HENT: Positive for congestion, rhinorrhea and sinus pressure. Negative for ear pain.   Eyes: Negative for discharge.  Respiratory: Positive for cough. Negative for shortness of breath and wheezing.   Cardiovascular: Negative for chest pain.   Virtual Visit via Video Note  I connected with [name] on 07/09/19 at 11:00 AM EST by a video enabled telemedicine application and verified that I am speaking with the correct person using two identifiers.  Location: Patient: home Provider: office   I discussed the limitations of evaluation and management by telemedicine and the availability of in person appointments. The patient expressed understanding and agreed to proceed.  History of Present Illness:    Observations/Objective:   Assessment and Plan:   Follow Up Instructions:    I discussed the assessment and treatment plan with the patient. The patient was provided an opportunity to ask questions and all were answered. The patient agreed with the plan and demonstrated an understanding of the instructions.   The patient was advised to call back or seek an in-person evaluation if the symptoms worsen or if the condition fails to improve as anticipated.  I provided 15 minutes of non-face-to-face time during this encounter.        Objective:   Physical Exam Patient had  virtual visit Appears to be in no distress Atraumatic Neuro able to relate and oriented No apparent resp distress Color normal        Assessment & Plan:  Acute rhinosinusitis Antibiotic prescribed warning signs discussed follow-up if progressive troubles or worse I doubt Covid I do not feel testing is necessary

## 2019-08-07 DIAGNOSIS — R7303 Prediabetes: Secondary | ICD-10-CM | POA: Diagnosis not present

## 2019-08-07 DIAGNOSIS — I10 Essential (primary) hypertension: Secondary | ICD-10-CM | POA: Diagnosis not present

## 2019-08-07 DIAGNOSIS — Z79899 Other long term (current) drug therapy: Secondary | ICD-10-CM | POA: Diagnosis not present

## 2019-08-08 LAB — LIPID PANEL
Chol/HDL Ratio: 4 ratio (ref 0.0–4.4)
Cholesterol, Total: 202 mg/dL — ABNORMAL HIGH (ref 100–199)
HDL: 50 mg/dL (ref >39)
LDL Chol Calc (NIH): 124 mg/dL — ABNORMAL HIGH (ref 0–99)
Triglycerides: 160 mg/dL — ABNORMAL HIGH (ref 0–149)
VLDL Cholesterol Cal: 28 mg/dL (ref 5–40)

## 2019-08-08 LAB — BASIC METABOLIC PANEL
BUN/Creatinine Ratio: 10 (ref 9–23)
BUN: 11 mg/dL (ref 6–24)
CO2: 19 mmol/L — ABNORMAL LOW (ref 20–29)
Calcium: 9.1 mg/dL (ref 8.7–10.2)
Chloride: 107 mmol/L — ABNORMAL HIGH (ref 96–106)
Creatinine, Ser: 1.07 mg/dL — ABNORMAL HIGH (ref 0.57–1.00)
GFR calc Af Amer: 69 mL/min/{1.73_m2} (ref >59)
GFR calc non Af Amer: 60 mL/min/{1.73_m2} (ref >59)
Glucose: 132 mg/dL — ABNORMAL HIGH (ref 65–99)
Potassium: 4.4 mmol/L (ref 3.5–5.2)
Sodium: 141 mmol/L (ref 134–144)

## 2019-08-08 LAB — HEPATIC FUNCTION PANEL
ALT: 15 IU/L (ref 0–32)
AST: 15 IU/L (ref 0–40)
Albumin: 4.2 g/dL (ref 3.8–4.9)
Alkaline Phosphatase: 79 IU/L (ref 39–117)
Bilirubin Total: 0.5 mg/dL (ref 0.0–1.2)
Bilirubin, Direct: 0.12 mg/dL (ref 0.00–0.40)
Total Protein: 7 g/dL (ref 6.0–8.5)

## 2019-08-08 LAB — HEMOGLOBIN A1C
Est. average glucose Bld gHb Est-mCnc: 140 mg/dL
Hgb A1c MFr Bld: 6.5 % — ABNORMAL HIGH (ref 4.8–5.6)

## 2019-08-22 ENCOUNTER — Ambulatory Visit (INDEPENDENT_AMBULATORY_CARE_PROVIDER_SITE_OTHER): Payer: Medicaid Other | Admitting: Family Medicine

## 2019-08-22 ENCOUNTER — Other Ambulatory Visit: Payer: Self-pay

## 2019-08-22 DIAGNOSIS — J019 Acute sinusitis, unspecified: Secondary | ICD-10-CM | POA: Diagnosis not present

## 2019-08-22 DIAGNOSIS — B9689 Other specified bacterial agents as the cause of diseases classified elsewhere: Secondary | ICD-10-CM | POA: Diagnosis not present

## 2019-08-22 MED ORDER — DOXYCYCLINE HYCLATE 100 MG PO TABS: 100.0000 mg | ORAL_TABLET | Freq: Two times a day (BID) | ORAL | 0 refills | Status: DC

## 2019-08-22 NOTE — Progress Notes (Signed)
   Subjective:  Audio plus video  Patient ID: Pamela Mendez, female    DOB: Aug 04, 1966, 53 y.o.   MRN: [id number]  Sinusitis This is a new problem. Episode onset: 2 days. Associated symptoms include congestion and sinus pressure. Treatments tried: mucinex.   Virtual Visit via Video Note  I connected with Pamela Mendez on 08/22/19 at 11:00 AM EST by a video enabled telemedicine application and verified that I am speaking with the correct person using two identifiers.  Location: Patient: home Provider: office   I discussed the limitations of evaluation and management by telemedicine and the availability of in person appointments. The patient expressed understanding and agreed to proceed.  History of Present Illness:    Observations/Objective:   Assessment and Plan:   Follow Up Instructions:    I discussed the assessment and treatment plan with the patient. The patient was provided an opportunity to ask questions and all were answered. The patient agreed with the plan and demonstrated an understanding of the instructions.   The patient was advised to call back or seek an in-person evaluation if the symptoms worsen or if the condition fails to improve as anticipated.  I provided 18 minutes of non-face-to-face time during this encounter.   Behind eye and thru sinus cavities  No fever  No exposure  No cough or sore throt       Review of Systems  HENT: Positive for congestion and sinus pressure.        Objective:   Physical Exam        Assessment & Plan:  Impression rhinosinusitis.  Patient highly doubt COVID-19.  States it feels like her classic rhinosinusitis which she often gets this time year with change of weather.  Warning signs discussed in this regard.  Discussed with patient. plan antibiotics prescribed. Questions answered. Symptomatic care discussed. warning signs discussed. WSL

## 2019-08-24 ENCOUNTER — Encounter: Payer: Self-pay | Admitting: Family Medicine

## 2019-08-25 ENCOUNTER — Ambulatory Visit (INDEPENDENT_AMBULATORY_CARE_PROVIDER_SITE_OTHER): Payer: Medicaid Other | Admitting: Family Medicine

## 2019-08-25 ENCOUNTER — Other Ambulatory Visit: Payer: Self-pay

## 2019-08-25 DIAGNOSIS — E785 Hyperlipidemia, unspecified: Secondary | ICD-10-CM

## 2019-08-25 DIAGNOSIS — E7849 Other hyperlipidemia: Secondary | ICD-10-CM

## 2019-08-25 DIAGNOSIS — E1169 Type 2 diabetes mellitus with other specified complication: Secondary | ICD-10-CM

## 2019-08-25 DIAGNOSIS — N289 Disorder of kidney and ureter, unspecified: Secondary | ICD-10-CM

## 2019-08-25 MED ORDER — ROSUVASTATIN CALCIUM 20 MG PO TABS: 20.0000 mg | ORAL_TABLET | Freq: Every day | ORAL | 1 refills | Status: DC

## 2019-08-25 NOTE — Progress Notes (Signed)
   Subjective:    Patient ID: Pamela Mendez, female    DOB: 10/23/1965, 53 y.o.   MRN: [id number]  HPI Pt had labs completed on 08/07/2019. Provider would like to have a discussion to discuss labs. Pt states she is not having issues. Pt states her sugars have been in the upper 90s -107.  We discussed her lab work including cholesterol as well as her A1c we also talked about diet healthy activity Virtual Visit via Telephone Note  I connected with Pamela Mendez on 08/25/19 at  9:00 AM EST by telephone and verified that I am speaking with the correct person using two identifiers.  Location: Patient: home Provider: office   I discussed the limitations, risks, security and privacy concerns of performing an evaluation and management service by telephone and the availability of in person appointments. I also discussed with the patient that there may be a patient responsible charge related to this service. The patient expressed understanding and agreed to proceed.   History of Present Illness:    Observations/Objective:   Assessment and Plan:   Follow Up Instructions:    I discussed the assessment and treatment plan with the patient. The patient was provided an opportunity to ask questions and all were answered. The patient agreed with the plan and demonstrated an understanding of the instructions.   The patient was advised to call back or seek an in-person evaluation if the symptoms worsen or if the condition fails to improve as anticipated.  I provided 15 minutes of non-face-to-face time during this encounter.   Vicente Males, LPN   Review of Systems     Objective:   Physical Exam  Today's visit was via telephone Physical exam was not possible for this visit       Assessment & Plan:  Type 2 diabetes A1c 6.5 Patient does not want to start Metformin just yet she would like to work hard on diet with physical activity and recheck the A1c again later in the spring in  addition to this she will check her sugars periodically in the morning hours as well as before supper and send [place] updates  Hyperlipidemia not at goal stop pravastatin.  We will go ahead with rosuvastatin for 20 mg daily and recheck her A1c along with lipid profile and met 7 in late March  She will continue her pain medication visits  Renal insufficiency healthy diet recommended keep risk factors under good control

## 2019-09-23 ENCOUNTER — Other Ambulatory Visit: Payer: Self-pay

## 2019-09-23 ENCOUNTER — Telehealth: Payer: Self-pay | Admitting: Family Medicine

## 2019-09-23 ENCOUNTER — Ambulatory Visit (INDEPENDENT_AMBULATORY_CARE_PROVIDER_SITE_OTHER): Payer: Medicaid Other | Admitting: Family Medicine

## 2019-09-23 DIAGNOSIS — I10 Essential (primary) hypertension: Secondary | ICD-10-CM | POA: Diagnosis not present

## 2019-09-23 DIAGNOSIS — G894 Chronic pain syndrome: Secondary | ICD-10-CM

## 2019-09-23 DIAGNOSIS — J019 Acute sinusitis, unspecified: Secondary | ICD-10-CM | POA: Diagnosis not present

## 2019-09-23 MED ORDER — OXYCODONE-ACETAMINOPHEN 5-325 MG PO TABS: ORAL_TABLET | ORAL | 0 refills | Status: DC

## 2019-09-23 MED ORDER — DOXYCYCLINE HYCLATE 100 MG PO TABS: 100.0000 mg | ORAL_TABLET | Freq: Two times a day (BID) | ORAL | 0 refills | Status: DC

## 2019-09-23 NOTE — Telephone Encounter (Signed)
Form was completed thank you 

## 2019-09-23 NOTE — Telephone Encounter (Signed)
PA needed for patients Oxycodone 5-325 mg. PA to be faxed to [employer] once completed. Form in provider office for review. Please advise. Thank you

## 2019-09-23 NOTE — Progress Notes (Signed)
   Subjective:    Patient ID: Pamela Mendez, female    DOB: Apr 07, 1966, 54 y.o.   MRN: [id number] Audiovisual for today's visit not in person Sinusitis This is a recurrent problem. Episode onset: for a few months. Associated symptoms include congestion, coughing and sinus pressure. Pertinent negatives include no ear pain or shortness of breath. Treatments tried: doxy about 3 different times over the past 3 months.   Arthritis is getting worse. States she is having pain in right shoulder and lower back. Takes hydrocodone for back pain but states med does not help the pain. Takes one qid.  This patient has chronic low back pain is had previous surgeries.  She has been on hydrocodone for years.  Unfortunately the hydrocodone no longer is effective for her Does not alleviate any of her pain It is important to alleviate her pain in order for her to be more functional in her daily life.  She has tried physical therapy in the past she is also seeing a back specialist and has had injections done that this is helped she may need to have a repeat of her MRI but it is necessary to get her pain under better control  Virtual Visit via Telephone Note  I connected with Pamela Mendez on 09/23/19 at 11:00 AM EST by telephone and verified that I am speaking with the correct person using two identifiers.  Location: Patient: home Provider: office   I discussed the limitations, risks, security and privacy concerns of performing an evaluation and management service by telephone and the availability of in person appointments. I also discussed with the patient that there may be a patient responsible charge related to this service. The patient expressed understanding and agreed to proceed.   History of Present Illness:    Observations/Objective:   Assessment and Plan:   Follow Up Instructions:    I discussed the assessment and treatment plan with the patient. The patient was provided an opportunity to ask  questions and all were answered. The patient agreed with the plan and demonstrated an understanding of the instructions.   The patient was advised to call back or seek an in-person evaluation if the symptoms worsen or if the condition fails to improve as anticipated.  I provided 30 minutes of non-face-to-face time during this encounter.       Review of Systems  Constitutional: Negative for activity change and fever.  HENT: Positive for congestion, rhinorrhea and sinus pressure. Negative for ear pain.   Eyes: Negative for discharge.  Respiratory: Positive for cough. Negative for shortness of breath and wheezing.   Cardiovascular: Negative for chest pain.  Musculoskeletal: Positive for arthralgias and back pain.       Objective:   Physical Exam  Today's visit was virtual  physical exam was not possible for this visit       Assessment & Plan:  Change pain medicine to oxycodone 5 mg 4 times daily Patient does not want to go back to see back specialist just yet If she has ongoing trouble she will consider doing so She will follow-up with [place] in 3 to 4 weeks at that time may need to get her set up for further evaluation or possibly MRI  Acute rhinosinusitis antibiotic sent in see above  It should be noted that she is under a pain management contract with [place].  Is also noted that we have tried various modalities to help her pain without much success.

## 2019-09-24 NOTE — Telephone Encounter (Signed)
Form faxed to insurance and awaiting approval.

## 2019-10-01 NOTE — Telephone Encounter (Signed)
Contacted [employer] to check on PA status. Oxycodone-acetaminophen 5-325 mg has been approved. Approval good from 09/24/2019-03/22/2020. Pt contacted and verbalized understanding.

## 2019-10-08 ENCOUNTER — Other Ambulatory Visit: Payer: Self-pay | Admitting: Family Medicine

## 2019-10-08 NOTE — Telephone Encounter (Signed)
Med check up 08/25/19

## 2019-10-27 ENCOUNTER — Ambulatory Visit: Payer: Medicaid Other | Admitting: Family Medicine

## 2019-10-27 ENCOUNTER — Encounter: Payer: Self-pay | Admitting: Family Medicine

## 2019-10-27 ENCOUNTER — Other Ambulatory Visit: Payer: Self-pay

## 2019-10-27 VITALS — BP 134/82 | Temp 97.9°F | Wt 211.8 lb

## 2019-10-27 DIAGNOSIS — M5440 Lumbago with sciatica, unspecified side: Secondary | ICD-10-CM | POA: Diagnosis not present

## 2019-10-27 DIAGNOSIS — M5431 Sciatica, right side: Secondary | ICD-10-CM

## 2019-10-27 DIAGNOSIS — G8929 Other chronic pain: Secondary | ICD-10-CM

## 2019-10-27 DIAGNOSIS — J0181 Other acute recurrent sinusitis: Secondary | ICD-10-CM | POA: Diagnosis not present

## 2019-10-27 MED ORDER — OXYCODONE-ACETAMINOPHEN 5-325 MG PO TABS: ORAL_TABLET | ORAL | 0 refills | Status: DC

## 2019-10-27 MED ORDER — DOXYCYCLINE HYCLATE 100 MG PO CAPS: 100.0000 mg | ORAL_CAPSULE | Freq: Two times a day (BID) | ORAL | 0 refills | Status: DC

## 2019-10-27 NOTE — Progress Notes (Signed)
   Subjective:    Patient ID: Pamela AMBROCIO, female    DOB: Dec 21, 1965, 54 y.o.   MRN: [id number]  Back Pain This is a recurrent problem. Episode onset: 3-4 months. The pain is present in the lumbar spine. Radiates to: Right hip and leg. The symptoms are aggravated by lying down and sitting. Associated symptoms include leg pain. Pertinent negatives include no abdominal pain, chest pain, fever or weakness. Treatments tried: meds prescribed by provider; IcyHot; heating pad. The treatment provided mild relief.   Patient overall doing well low back discomfort giving her a lot of fits unfortunately hydrocodone no longer helps her pain we had to move to oxycodone she does state that that is doing better she cannot tell that gabapentin is helping her any she also relates that she finds her self having a lot of severe pain down the right leg and at times weakness in the leg she denies severe numbness but does have some burning discomfort down the leg  She does relate sinus symptoms for the past 2 to 3 weeks Review of Systems  Constitutional: Negative for activity change, appetite change, fatigue and fever.  HENT: Positive for congestion and rhinorrhea. Negative for ear pain.   Eyes: Negative for discharge.  Respiratory: Positive for cough. Negative for shortness of breath and wheezing.   Cardiovascular: Negative for chest pain and leg swelling.  Gastrointestinal: Negative for abdominal pain and diarrhea.  Endocrine: Negative for polydipsia and polyphagia.  Musculoskeletal: Positive for back pain.  Skin: Negative for color change.  Neurological: Negative for dizziness and weakness.  Psychiatric/Behavioral: Negative for behavioral problems and confusion.   30 minutes spent between reviewing her record documentation examining the patient in decision making    Objective:   Physical Exam Eardrums are normal throat is normal respiratory rate is normal low back subjective tenderness positive straight leg  raise on the right negative on the left some weakness in dorsiflexion on the right normal on the left range of motion limited because of back pain P lungs are clear respiratory rate normal heart regular    Assessment & Plan:  This patient has had previous surgery Last MRI was several years ago Patient having significant symptoms and troubles consistent with progression of her back troubles.  Given her sciatica and her leg weakness in severe symptoms over the past several months we will go ahead with MRI of the low back with contrast and referral back to her back specialist  Sinus infection antibiotics prescribed

## 2019-11-04 ENCOUNTER — Telehealth: Payer: Self-pay | Admitting: Family Medicine

## 2019-11-04 NOTE — Telephone Encounter (Signed)
Lumbar MRI was DENIED, see letter in red folder in basket on wall, please advise

## 2019-11-05 ENCOUNTER — Other Ambulatory Visit: Payer: Self-pay | Admitting: *Deleted

## 2019-11-05 DIAGNOSIS — M545 Low back pain, unspecified: Secondary | ICD-10-CM

## 2019-11-05 NOTE — Telephone Encounter (Signed)
Called and discussed with pt. She verbalized understanding of all. Orders for xray and pt put in and pt transferred to the front to schedule 3 week follow up

## 2019-11-05 NOTE — Telephone Encounter (Signed)
It is important for the patient to understand that Medicaid did not approve the MRI if we need to jump through a couple hoops in order to get this approved 1.  Plain x-rays of the lumbar spine 2.  Physical therapy referral for back pain patient needs to do at least one visit 3.  Follow-up visit in 3 weeks if still having discomfort then we will try to get the MRI approved again

## 2019-11-06 ENCOUNTER — Other Ambulatory Visit: Payer: Self-pay | Admitting: Family Medicine

## 2019-11-06 ENCOUNTER — Other Ambulatory Visit: Payer: Self-pay

## 2019-11-06 ENCOUNTER — Ambulatory Visit (HOSPITAL_COMMUNITY)
Admission: RE | Admit: 2019-11-06 | Discharge: 2019-11-06 | Disposition: A | Discharge status: Home / Self Care | Payer: Medicaid Other | Source: Ambulatory Visit | Attending: Family Medicine | Admitting: Family Medicine

## 2019-11-06 DIAGNOSIS — M545 Low back pain: Secondary | ICD-10-CM | POA: Diagnosis not present

## 2019-11-06 NOTE — Telephone Encounter (Signed)
Last med check up 06/23/19

## 2019-11-12 ENCOUNTER — Encounter: Payer: Self-pay | Admitting: Family Medicine

## 2019-11-18 DIAGNOSIS — H524 Presbyopia: Secondary | ICD-10-CM | POA: Diagnosis not present

## 2019-11-18 DIAGNOSIS — H5213 Myopia, bilateral: Secondary | ICD-10-CM | POA: Diagnosis not present

## 2019-11-18 DIAGNOSIS — H2513 Age-related nuclear cataract, bilateral: Secondary | ICD-10-CM | POA: Diagnosis not present

## 2019-11-22 DIAGNOSIS — Z23 Encounter for immunization: Secondary | ICD-10-CM | POA: Diagnosis not present

## 2019-11-24 ENCOUNTER — Encounter (HOSPITAL_COMMUNITY): Payer: Self-pay

## 2019-11-24 ENCOUNTER — Ambulatory Visit (HOSPITAL_COMMUNITY): Payer: Medicaid Other | Attending: Family Medicine

## 2019-11-24 ENCOUNTER — Other Ambulatory Visit: Payer: Self-pay

## 2019-11-24 DIAGNOSIS — M6281 Muscle weakness (generalized): Secondary | ICD-10-CM | POA: Diagnosis not present

## 2019-11-24 DIAGNOSIS — R29898 Other symptoms and signs involving the musculoskeletal system: Secondary | ICD-10-CM | POA: Insufficient documentation

## 2019-11-24 DIAGNOSIS — G8929 Other chronic pain: Secondary | ICD-10-CM | POA: Diagnosis not present

## 2019-11-24 DIAGNOSIS — M5441 Lumbago with sciatica, right side: Secondary | ICD-10-CM | POA: Diagnosis not present

## 2019-11-24 DIAGNOSIS — R2689 Other abnormalities of gait and mobility: Secondary | ICD-10-CM | POA: Diagnosis not present

## 2019-11-24 NOTE — Therapy (Signed)
[facility] [address] [place], [place], [address] Phone: [phone]   Fax:  [phone]  Physical Therapy Evaluation  Patient Details  Name: Pamela Mendez MRN: [id number] Date of Birth: 29-May-1966 Referring Provider (PT): Lilyan Punt, MD   Encounter Date: 11/24/2019  PT End of Session - 11/24/19 1046    Visit Number  1    Number of Visits  8    Date for PT Re-Evaluation  12/22/19    Authorization Type  Medicaid    Authorization Time Period  11/24/19 to 12/22/19; 3 requested on 3/22 check for approval    PT Start Time  0950    PT Stop Time  1034    PT Time Calculation (min)  44 min    Activity Tolerance  Patient limited by pain    Behavior During Therapy  [facility] for tasks assessed/performed       Past Medical History:  Diagnosis Date  . Abdominal pain    Unspecified site  . Anxiety   . Anxiety and depression   . 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 CHF (congestive heart failure) (HCC) 2004   post-partum  . Chronic back pain   . Chronic left hip pain   . Depression   . Gastritis   . GERD (gastroesophageal reflux disease)   . H/O echocardiogram 2012   EF 55-60%  . Hyperlipidemia   . Hypertension   . Migraine headache without aura   . Vertigo     Past Surgical History:  Procedure Laterality Date  . ABLATION    . Childbirth     time 3  . CHOLECYSTECTOMY  [address] , Dr Katrinka Blazing  . LUMBAR DISC SURGERY  2010   [facility], Dr Marikay Alar  . LUMBAR DISC SURGERY  2005   [place], Dr Marikay Alar    There were no vitals filed for this visit.   Subjective Assessment - 11/24/19 0954    Subjective  Pt denies severe numbness in RLE or low back. Pt reports pain radiates from surgical site to RLE, occasionally goes to LLE. Pt denies RLE buckling and giving away, denies loss of b/b, numbness/tingling in saddle area. Pt denies any falls in the last year. Pt reports heating pad on R low back and hip helps the  pain, but once starts moving around the pain comes back. Pt reports pain wakes her in the night and difficult to fall back asleep. Pt reports pain increases with household chores, but daughter has begun assisting her with that. Pt reports previous PT without relief, back injections provided relief for ~7 days. Pt reports referring MD is recommending return to neurosurgeon if PT fails. Pt reports normal pain 9/10 with movement and on pain medication and 6/10 pain is best with pain medication. Pt reports no new injuries but pain increased with cold weather this winter. 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    Pertinent History  2005 and 2010 low back surgery    How long can you sit comfortably?  no issues if in recliner, in straight back chairs 10 minutes    How long can you stand comfortably?  2-3 minutes before needing to move    How long can you walk comfortably?  10 minutes    Diagnostic tests  MRI denied    Patient Stated Goals  "I  don't know, I've tried physical therapy before and it didn't work"    Currently in Pain?  Yes    Pain Score  6     Pain Location  Back    Pain Orientation  Right;Left;Lower    Pain Descriptors / Indicators  Dull;Aching    Pain Type  Chronic pain    Pain Radiating Towards  L hip, posterior R leg to knee    Pain Onset  More than a month ago    Pain Frequency  Constant    Aggravating Factors   sweeping/vacuuming, standing    Pain Relieving Factors  heating pad, rest, pain medication    Effect of Pain on Daily Activities  limited         [facility] PT Assessment - 11/24/19 0001      Assessment   Medical Diagnosis  LBP    Referring Provider (PT)  Lilyan Punt, MD    Onset Date/Surgical Date  --   approximately Winter 2020   Next MD Visit  11/25/19    Prior Therapy  Yes, for same issue without relief      Precautions   Precautions  None      Restrictions   Weight Bearing  Restrictions  No      Balance Screen   Has the patient fallen in the past 6 months  No    Has the patient had a decrease in activity level because of a fear of falling?   No    Is the patient reluctant to leave their home because of a fear of falling?   No      Prior Function   Level of Independence  Independent    Vocation  On disability    Leisure  walking      Cognition   Overall Cognitive Status  Within Functional Limits for tasks assessed      Observation/Other Assessments   Focus on Therapeutic Outcomes (FOTO)   n/a      Sensation   Light Touch  Impaired Detail    Light Touch Impaired Details  Impaired RLE    Additional Comments  RLE numbness posterior thigh, lateral calf, part of R foot; heaviness sensation throughout other dermatomes      Coordination   Heel Shin Test  RLE: mid L shin due to "tightness" complaints      Functional Tests   Functional tests  Sit to Stand      Sit to Stand   Comments  5x STS: 24.3 sec from chair with UE assist lowering and rising      Posture/Postural Control   Posture/Postural Control  Postural limitations    Posture Comments  forward flexed trunk at hips, flat back with decreased curve throughout      ROM / Strength   AROM / PROM / Strength  Strength      Strength   Overall Strength Comments  RLE limited by c/o "tightness" throughout posterior thigh    Strength Assessment Site  Hip;Knee;Ankle    Right Hip Flexion  3/5    Right Hip Extension  2+/5    Right Hip ABduction  3-/5    Left Hip Flexion  4+/5    Left Hip Extension  3+/5    Left Hip ABduction  4+/5    Right Knee Flexion  3+/5    Right Knee Extension  3+/5    Left Knee Flexion  4+/5    Left Knee Extension  5/5  Right Ankle Dorsiflexion  3-/5    Left Ankle Dorsiflexion  5/5      Flexibility   Soft Tissue Assessment /Muscle Length  yes    Hamstrings  90/90 LLE: 110 deg, RLE: 90 deg    Quadriceps  +Ely's RLE for pain and tightness    Piriformis  bil tightness, R>L       Palpation   Spinal mobility  unable due to pain    SI assessment   tenderness to palpation R>L with increased pain    Palpation comment  recreation of symptoms with palpation to bil paraspinals, R>L      Special Tests    Special Tests  Lumbar    Lumbar Tests  Straight Leg Raise      Straight Leg Raise   Findings  Positive    Side   Right    Comment  negative LLE      Ambulation/Gait   Ambulation/Gait  Yes    Ambulation/Gait Assistance  7: Independent    Assistive device  None    Gait Pattern  Decreased arm swing - right;Decreased arm swing - left;Decreased stride length;Decreased hip/knee flexion - right;Decreased hip/knee flexion - left;Decreased dorsiflexion - right;Trunk flexed    Ambulation Surface  Level;Indoor    Gait Comments  observed pt ambulate into/out of clinic with forward flexed trunk, decreased bil arm swing, decreased bil hip/knee flexion in swing, decreased R foot clearance without foot drop noted, no unsteadiness or near falls      Balance   Balance Assessed  Yes      Static Standing Balance   Static Standing - Balance Support  No upper extremity supported    Static Standing Balance -  Activities   Single Leg Stance - Right Leg;Single Leg Stance - Left Leg    Static Standing - Comment/# of Minutes  LLE: 5 sec, RLE: 2 sec                Objective measurements completed on examination: See above findings.              PT Education - 11/24/19 1003    Education Details  Assessment findings, pain cycle, initiated HEP    Person(s) Educated  Patient    Methods  Explanation;Demonstration;Handout;Verbal cues    Comprehension  Verbalized understanding;Returned demonstration;Verbal cues required       PT Short Term Goals - 11/24/19 1057      PT SHORT TERM GOAL #1   Title  Pt will perform HEP at least 3x/week to improve strength, reduce pain and improve overall QoL.    Time  2    Period  Weeks    Status  New    Target Date  12/08/19         PT Long Term Goals - 11/24/19 1058      PT LONG TERM GOAL #1   Title  Pt will tolerate , ambulating at 1.0 m/s speed with 6/10 pain at worst to return to personal walking program.    Time  4    Period  Weeks    Status  New    Target Date  12/22/19      PT LONG TERM GOAL #2   Title  Pt will demonstrate 4/5 hip abduction strength and hip extensor strength bilaterally to reduce back pain and improve ability to ambulate and stand for longer durations.    Time  4    Period  Weeks    Status  New      PT LONG TERM GOAL #3   Title  Pt will self report 6/10 pain at worst with ambulating for 30 minutes to allow pt to complete grocery shopping and perform light household chores.    Time  4    Period  Weeks    Status  New             Plan - 11/24/19 1047    Clinical Impression Statement  Pt is a pleasant 54YO female with chronic LBP radiating down into RLE. Pt demonstrates positive R SLR test. Pt with tightness throughout bil hamstrings and piriformis with RLE being worse than LLE. Pt tender to palpation along bil paraspinals demonstrates guarding and withdrawal and recreation of symptoms, R>L. Pt with weakness, postural deficits, limited AROM, and high pain with mobility. Pt educated on pain cycle and benefits of PT to improve mobility and reduce pain with demonstration and cueing for HEP initiated at eval and pt verbalized understanding. Pt would benefit from skilled PT interventions to improve deficits noted, reduce pain with mobility and improve overall QoL.    Personal Factors and Comorbidities  Comorbidity 1;Past/Current Experience;Time since onset of injury/illness/exacerbation    Comorbidities  anxiety, CHF, chronic back pain    Examination-Activity Limitations  Bed Mobility;Bend;Lift;Locomotion Level;Reach Overhead;Sit;Sleep;Stand    Examination-Participation Restrictions  Cleaning;Community Activity;Laundry;Shop    Stability/Clinical Decision Making  Evolving/Moderate  complexity    Clinical Decision Making  Moderate    Rehab Potential  Fair    PT Frequency  2x / week    PT Duration  4 weeks    PT Treatment/Interventions  ADLs/Self Care Home Management;Aquatic Therapy;Cryotherapy;Moist Heat;DME Instruction;Gait training;Stair training;Functional mobility training;Therapeutic activities;Therapeutic exercise;Balance training;Neuromuscular re-education;Patient/family education;Orthotic Fit/Training;Manual techniques;Passive range of motion;Taping;Joint Manipulations    PT Next Visit Plan  Review goals, HEP. Check for clonus. Initiate core strengthening and use manual if tolerable. Continue to assess pain with mobility.    PT Home Exercise Plan  Eval: seated HS stretch, L seated piriformis stretch, TA activation in sidelying    Consulted and Agree with Plan of Care  Patient       Patient will benefit from skilled therapeutic intervention in order to improve the following deficits and impairments:  Abnormal gait, Decreased activity tolerance, Decreased balance, Decreased endurance, Decreased mobility, Decreased range of motion, Decreased strength, Difficulty walking, Hypomobility, Increased muscle spasms, Impaired perceived functional ability, Impaired flexibility, Impaired sensation, Improper body mechanics, Postural dysfunction, Pain  Visit Diagnosis: Chronic bilateral low back pain with right-sided sciatica  Muscle weakness (generalized)  Other abnormalities of gait and mobility  Other symptoms and signs involving the musculoskeletal system     Problem List Patient Active Problem List   Diagnosis Date Noted  . Chronic pain syndrome 03/25/2019  . Encounter for long-term opiate analgesic use 03/03/2019  . Prediabetes 03/03/2019  . HTN (hypertension) 12/08/2016  . Sinusitis 06/27/2016  . Sciatica of right side 01/04/2016  . Hypokalemia 10/07/2015  . Migraine headache without aura 03/31/2015  . GERD (gastroesophageal reflux disease) 02/04/2015  .  Chronic back pain 05/13/2013  . Allergic rhinitis 12/31/2012  . EDEMA 11/09/2010  . Hyperlipidemia 01/17/2010  . DEPRESSION/ANXIETY 01/17/2010  . CARDIOMYOPATHY, MILD 01/17/2010  . ABDOMINAL PAIN, UNSPECIFIED SITE 01/17/2010     Domenick Bookbinder PT, DPT 11/24/19, 11:01 AM [phone]  [facility] [address] [place], [place], [address] Phone: [phone]   Fax:  [phone]  Name: JALEIA HANKE MRN: [id number] Date of  Birth: [birth year]

## 2019-11-25 ENCOUNTER — Ambulatory Visit: Payer: Medicaid Other | Admitting: Family Medicine

## 2019-11-25 ENCOUNTER — Encounter: Payer: Self-pay | Admitting: Family Medicine

## 2019-11-25 VITALS — BP 128/88 | Temp 97.1°F | Ht 66.0 in | Wt 215.4 lb

## 2019-11-25 DIAGNOSIS — M5440 Lumbago with sciatica, unspecified side: Secondary | ICD-10-CM | POA: Diagnosis not present

## 2019-11-25 DIAGNOSIS — G8929 Other chronic pain: Secondary | ICD-10-CM

## 2019-11-25 MED ORDER — OXYCODONE-ACETAMINOPHEN 5-325 MG PO TABS: ORAL_TABLET | ORAL | 0 refills | Status: DC

## 2019-11-25 NOTE — Progress Notes (Signed)
   Subjective:    Patient ID: Pamela Mendez, female    DOB: 1966-03-18, 54 y.o.   MRN: [id number]  Back Pain This is a chronic problem. Episode onset: years ago but getting worse. The pain is present in the lumbar spine. Exacerbated by: cold, bending, twisting.  The reason patient is doing physical therapy is because she has been having ongoing pain and discomfort down her right leg.  We try to get an MRI unable to do so did plain x-rays which were negative tried anti-inflammatories which really have not helped has tried home exercises which is not helped now trying physical therapy may end up needing to have some injections but before her back specialist will see her they want a up-to-date MRI  Started physical therapy yesterday. Pt states she felt like she could hardly walk when she was leaving physical therapy and had a rough night last night due to therapy. Taking oxycodone one qid for pain and gabapentin.  She states that she is having significant low back pain and right leg pain especially with activity she just started physical therapy but unable to tell if it is helping a lot at this point patient plans on doing physical therapy twice a week for the next 4 weeks  She states the pain medication is helping but denies excessive use takes it 4 times a day does not cause drowsiness Review of Systems  Musculoskeletal: Positive for back pain.  Relates back pain relates right leg pain denies chest pain shortness of breath nausea vomiting diarrhea     Objective:   Physical Exam  Lungs are clear respiratory rate normal heart regular no murmurs Low back pain and discomfort to palpation with positive straight leg raise on the right Patient has difficulty getting onto and off exam table      Assessment & Plan:  Low back pain with sciatica Patient doing physical therapy currently Plain x-rays looked good If the intractable pain does not improve over the course of the next 4 weeks next step  would be referral for MRI

## 2019-11-26 ENCOUNTER — Other Ambulatory Visit: Payer: Self-pay

## 2019-11-26 ENCOUNTER — Ambulatory Visit (HOSPITAL_COMMUNITY): Payer: Medicaid Other

## 2019-11-26 ENCOUNTER — Encounter (HOSPITAL_COMMUNITY): Payer: Self-pay

## 2019-11-26 DIAGNOSIS — R29898 Other symptoms and signs involving the musculoskeletal system: Secondary | ICD-10-CM

## 2019-11-26 DIAGNOSIS — G8929 Other chronic pain: Secondary | ICD-10-CM | POA: Diagnosis not present

## 2019-11-26 DIAGNOSIS — M5441 Lumbago with sciatica, right side: Secondary | ICD-10-CM | POA: Diagnosis not present

## 2019-11-26 DIAGNOSIS — H5213 Myopia, bilateral: Secondary | ICD-10-CM | POA: Diagnosis not present

## 2019-11-26 DIAGNOSIS — R2689 Other abnormalities of gait and mobility: Secondary | ICD-10-CM | POA: Diagnosis not present

## 2019-11-26 DIAGNOSIS — M6281 Muscle weakness (generalized): Secondary | ICD-10-CM | POA: Diagnosis not present

## 2019-11-26 NOTE — Therapy (Signed)
[facility] [address] [place], [place], [address] Phone: [phone]   Fax:  [phone]  Physical Therapy Treatment  Patient Details  Name: IZZIE Mendez MRN: [id number] Date of Birth: 1965-10-06 Referring Provider (PT): Lilyan Punt, MD   Encounter Date: 11/26/2019  PT End of Session - 11/26/19 1425    Visit Number  2    Number of Visits  8    Date for PT Re-Evaluation  12/22/19    Authorization Type  Medicaid    Authorization Time Period  11/24/19 to 12/22/19; (3 visits 3/23 - 4/5 approved)    Authorization - Visit Number  1    Authorization - Number of Visits  3    PT Start Time  1430    PT Stop Time  1515    PT Time Calculation (min)  45 min    Activity Tolerance  Patient tolerated treatment well;No increased pain    Behavior During Therapy  WFL for tasks assessed/performed       Past Medical History:  Diagnosis Date  . Abdominal pain    Unspecified site  . Anxiety   . Anxiety and depression   . 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 CHF (congestive heart failure) (HCC) 2004   post-partum  . Chronic back pain   . Chronic left hip pain   . Depression   . Gastritis   . GERD (gastroesophageal reflux disease)   . H/O echocardiogram 2012   EF 55-60%  . Hyperlipidemia   . Hypertension   . Migraine headache without aura   . Vertigo     Past Surgical History:  Procedure Laterality Date  . ABLATION    . Childbirth     time 3  . CHOLECYSTECTOMY  [address] , Dr Katrinka Blazing  . LUMBAR DISC SURGERY  2010   [facility], Dr Marikay Alar  . LUMBAR DISC SURGERY  2005   [place], Dr Marikay Alar    There were no vitals filed for this visit.  Subjective Assessment - 11/26/19 1432    Subjective  Pt reports this damp weather makes her feel bad. Pt reports 7/10 pain and no new activity. Pt reports able to do HEP for 10 minutes before pain began. Pt denies loss of b/b, severe pain or numbness, BLE buckling causing  fall since evaluation.    Pertinent History  2005 and 2010 low back surgery    How long can you sit comfortably?  no issues if in recliner, in straight back chairs 10 minutes    How long can you stand comfortably?  2-3 minutes before needing to move    How long can you walk comfortably?  10 minutes    Diagnostic tests  MRI denied    Patient Stated Goals  "I don't know, I've tried physical therapy before and it didn't work"    Currently in Pain?  Yes    Pain Score  7     Pain Location  Back    Pain Orientation  Right;Left;Lower    Pain Descriptors / Indicators  Dull;Aching    Pain Type  Chronic pain    Pain Onset  More than a month ago    Pain Frequency  Constant    Aggravating Factors   sweeping/vacuuming, standing    Pain Relieving Factors  heating pad, rest, pain medication    Effect of Pain on Daily Activities  limited  [facility] PT Assessment - 11/26/19 0001      Ambulation/Gait   Ambulation/Gait  Yes    Ambulation/Gait Assistance  7: Independent    Ambulation Distance (Feet)  1100 Feet    Assistive device  None    Gait Pattern  Decreased arm swing - right;Decreased arm swing - left;Decreased stride length;Decreased hip/knee flexion - right;Decreased hip/knee flexion - left;Decreased dorsiflexion - right;Trunk flexed    Ambulation Surface  Level;Indoor    Gait Comments  , 7/10 pain (no pain increase from sitting)              [facility] Adult PT Treatment/Exercise - 11/26/19 0001      Exercises   Exercises  Lumbar      Lumbar Exercises: Stretches   Active Hamstring Stretch  Left;2 reps;20 seconds    Active Hamstring Stretch Limitations  seated; unable to perform R due to pain    Other Lumbar Stretch Exercise  seated trunk rotation, 10 reps x5 sec hold; seated trunk flexion/extension, x10 reps; seated lateral trunk flexion, x10 reps each direction      Lumbar Exercises: Seated   Other Seated Lumbar Exercises  TA activation with exhale    Other Seated Lumbar  Exercises  seated pelvic tilts anterior/posterior, x10 reps each direction; seated hip abduction GTB, x10 reps      Manual Therapy   Manual Therapy  Soft tissue mobilization    Manual therapy comments  completed sepereate from rest of treatment interventions    Soft tissue mobilization  pt prone, green ball for instrument assisted STM to R lumbar paraspinals and R hip musculature, gentle pressure for pain relief             PT Education - 11/26/19 1425    Education Details  Reviewed goals, exercise technique, HEP, benefits of manual STM    Person(s) Educated  Patient    Methods  Explanation    Comprehension  Verbalized understanding       PT Short Term Goals - 11/26/19 1426      PT SHORT TERM GOAL #1   Title  Pt will perform HEP at least 3x/week to improve strength, reduce pain and improve overall QoL.    Time  2    Period  Weeks    Status  On-going    Target Date  12/08/19        PT Long Term Goals - 11/26/19 1426      PT LONG TERM GOAL #1   Title  Pt will tolerate , ambulating at 1.0 m/s speed with 6/10 pain at worst to return to personal walking program.    Time  4    Period  Weeks    Status  On-going      PT LONG TERM GOAL #2   Title  Pt will demonstrate 4/5 hip abduction strength and hip extensor strength bilaterally to reduce back pain and improve ability to ambulate and stand for longer durations.    Time  4    Period  Weeks    Status  On-going      PT LONG TERM GOAL #3   Title  Pt will self report 6/10 pain at worst with ambulating for 30 minutes to allow pt to complete grocery shopping and perform light household chores.    Time  4    Period  Weeks    Status  On-going            Plan - 11/26/19 1428  Clinical Impression Statement  Pt continues to be limited by pain. Pt unable to perform seated R HS stretch due to increasing pain. Attempted TA activation in sidelying, but pt unable due to uncomfortable in sidelying position; pt unable to  lay supine due to back pain. Pt able to complete 6MWT without increase in low back pain from start of therapy. Pt tolerates gentle seated AROM stretching within pain-free range. Pt tolerated seated core and BLE strengthening exercises without increased pain, but unable to do multiple sets. Ended with IASTM using green ball to R lumbar paraspinals and R hip musculature with positive results. Pt denies increase in pain at EOS. Continue to progress as able.    Personal Factors and Comorbidities  Comorbidity 1;Past/Current Experience;Time since onset of injury/illness/exacerbation    Comorbidities  anxiety, CHF, chronic back pain    Examination-Activity Limitations  Bed Mobility;Bend;Lift;Locomotion Level;Reach Overhead;Sit;Sleep;Stand    Examination-Participation Restrictions  Cleaning;Community Activity;Laundry;Shop    Stability/Clinical Decision Making  Evolving/Moderate complexity    Rehab Potential  Fair    PT Frequency  2x / week    PT Duration  4 weeks    PT Treatment/Interventions  ADLs/Self Care Home Management;Aquatic Therapy;Cryotherapy;Moist Heat;DME Instruction;Gait training;Stair training;Functional mobility training;Therapeutic activities;Therapeutic exercise;Balance training;Neuromuscular re-education;Patient/family education;Orthotic Fit/Training;Manual techniques;Passive range of motion;Taping;Joint Manipulations    PT Next Visit Plan  Continue core strengthening, glute strengthening and manual for pain control. Continue to assess pain with mobility.    PT Home Exercise Plan  Eval: seated HS stretch, L seated piriformis stretch, TA activation in sidelying    Consulted and Agree with Plan of Care  Patient       Patient will benefit from skilled therapeutic intervention in order to improve the following deficits and impairments:  Abnormal gait, Decreased activity tolerance, Decreased balance, Decreased endurance, Decreased mobility, Decreased range of motion, Decreased strength,  Difficulty walking, Hypomobility, Increased muscle spasms, Impaired perceived functional ability, Impaired flexibility, Impaired sensation, Improper body mechanics, Postural dysfunction, Pain  Visit Diagnosis: Chronic bilateral low back pain with right-sided sciatica  Muscle weakness (generalized)  Other abnormalities of gait and mobility  Other symptoms and signs involving the musculoskeletal system     Problem List Patient Active Problem List   Diagnosis Date Noted  . Chronic pain syndrome 03/25/2019  . Encounter for long-term opiate analgesic use 03/03/2019  . Prediabetes 03/03/2019  . HTN (hypertension) 12/08/2016  . Sinusitis 06/27/2016  . Sciatica of right side 01/04/2016  . Hypokalemia 10/07/2015  . Migraine headache without aura 03/31/2015  . GERD (gastroesophageal reflux disease) 02/04/2015  . Chronic back pain 05/13/2013  . Allergic rhinitis 12/31/2012  . EDEMA 11/09/2010  . Hyperlipidemia 01/17/2010  . DEPRESSION/ANXIETY 01/17/2010  . CARDIOMYOPATHY, MILD 01/17/2010  . ABDOMINAL PAIN, UNSPECIFIED SITE 01/17/2010     Talbot Grumbling PT, DPT 11/26/19, 3:20 PM [place], [place], [address] Phone: [phone]   Fax:  [phone]  Name: Pamela Mendez MRN: [id number] Date of Birth: Sep 30, 1965

## 2019-12-02 ENCOUNTER — Encounter: Payer: Self-pay | Admitting: Family Medicine

## 2019-12-02 ENCOUNTER — Ambulatory Visit (HOSPITAL_COMMUNITY): Payer: Medicaid Other | Admitting: Physical Therapy

## 2019-12-02 ENCOUNTER — Encounter (HOSPITAL_COMMUNITY): Payer: Self-pay

## 2019-12-02 ENCOUNTER — Other Ambulatory Visit: Payer: Self-pay

## 2019-12-02 ENCOUNTER — Telehealth (HOSPITAL_COMMUNITY): Payer: Self-pay | Admitting: Physical Therapy

## 2019-12-02 MED ORDER — DOXYCYCLINE HYCLATE 100 MG PO TABS: ORAL_TABLET | ORAL | 0 refills | Status: DC
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
## 2019-12-02 NOTE — Addendum Note (Signed)
Addended by: Marlowe Shores on: 12/02/2019 02:45 PM   Modules accepted: Orders

## 2019-12-02 NOTE — Telephone Encounter (Signed)
Nurses Given her recent visit I am okay with sending in a round of antibiotics Doxycycline 100 mg twice daily for 7 days If ongoing troubles then will need to do follow-up visit for virtual visit

## 2019-12-04 ENCOUNTER — Ambulatory Visit (HOSPITAL_COMMUNITY): Payer: Medicaid Other | Attending: Family Medicine | Admitting: Physical Therapy

## 2019-12-04 ENCOUNTER — Other Ambulatory Visit: Payer: Self-pay

## 2019-12-04 DIAGNOSIS — M6281 Muscle weakness (generalized): Secondary | ICD-10-CM | POA: Diagnosis not present

## 2019-12-04 DIAGNOSIS — R2689 Other abnormalities of gait and mobility: Secondary | ICD-10-CM | POA: Diagnosis not present

## 2019-12-04 DIAGNOSIS — R29898 Other symptoms and signs involving the musculoskeletal system: Secondary | ICD-10-CM | POA: Diagnosis not present

## 2019-12-04 DIAGNOSIS — G8929 Other chronic pain: Secondary | ICD-10-CM | POA: Insufficient documentation

## 2019-12-04 DIAGNOSIS — M5441 Lumbago with sciatica, right side: Secondary | ICD-10-CM | POA: Diagnosis not present

## 2019-12-04 NOTE — Therapy (Signed)
[place], [place], [address] Phone: [phone]   Fax:  [phone]  Physical Therapy Treatment  Patient Details  Name: Pamela Mendez MRN: [id number] Date of Birth: [birth year] Referring Provider (PT): Sallee Lange, MD   Encounter Date: 12/04/2019  PT End of Session - 12/04/19 1203    Visit Number  3    Number of Visits  8    Date for PT Re-Evaluation  12/22/19    Authorization Type  Medicaid    Authorization Time Period  11/24/19 to 12/22/19; (3 visits 3/23 - 4/5 approved)    Authorization - Visit Number  2    Authorization - Number of Visits  3    PT Start Time  4854    PT Stop Time  1215    PT Time Calculation (min)  40 min    Activity Tolerance  Patient tolerated treatment well;No increased pain    Behavior During Therapy  WFL for tasks assessed/performed       Past Medical History:  Diagnosis Date  . Abdominal pain    Unspecified site  . Anxiety   . Anxiety and depression   . 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 CHF (congestive heart failure) ([place]) 2004   post-partum  . Chronic back pain   . Chronic left hip pain   . Depression   . Gastritis   . GERD (gastroesophageal reflux disease)   . H/O echocardiogram 2012   EF 55-60%  . Hyperlipidemia   . Hypertension   . Migraine headache without aura   . Vertigo     Past Surgical History:  Procedure Laterality Date  . ABLATION    . Childbirth     time 3  . CHOLECYSTECTOMY  [address] , Dr Tamala Julian  . LUMBAR DISC SURGERY  2010   [facility], Dr Sherley Bounds  . LUMBAR DISC SURGERY  2005   [place], Dr Sherley Bounds    There were no vitals filed for this visit.  Subjective Assessment - 12/04/19 1140    Subjective  pt states her pain is 9/10.  No incident related besides possibly keeping her 53 year old grandaughter that just began walking.  Also reports the change in weather may be affecting it.    Currently in Pain?  Yes    Pain  Score  9     Pain Location  Back    Pain Orientation  Right;Left;Lower    Pain Descriptors / Indicators  Aching;Dull                       [facility] Adult PT Treatment/Exercise - 12/04/19 0001      Lumbar Exercises: Stretches   Active Hamstring Stretch  Left;2 reps;20 seconds    Active Hamstring Stretch Limitations  seated; unable to perform R due to pain    Other Lumbar Stretch Exercise  seated trunk rotation, 10 reps x5 sec hold; seated trunk flexion/extension, x10 reps; seated lateral trunk flexion, x10 reps each direction      Lumbar Exercises: Standing   Scapular Retraction  AROM;Both;10 reps;Theraband;Limitations    Theraband Level (Scapular Retraction)  Level 2 (Red)    Scapular Retraction Limitations  seated      Lumbar Exercises: Seated   Sit to Stand  10 reps;Limitations    Sit to Stand Limitations  with upright posturing, no UE assist from standard chair height  Other Seated Lumbar Exercises  seated pelvic tilts anterior/posterior, x10 reps each direction; seated hip abduction GTB, x10 reps      Lumbar Exercises: Prone   Other Prone Lumbar Exercises  heelqueezes 10X5"       Manual Therapy   Manual Therapy  Soft tissue mobilization    Manual therapy comments  completed sepereate from rest of treatment interventions    Soft tissue mobilization  pt prone, green ball for instrument assisted STM to R lumbar paraspinals and R hip musculature, gentle pressure for pain relief               PT Short Term Goals - 11/26/19 1426      PT SHORT TERM GOAL #1   Title  Pt will perform HEP at least 3x/week to improve strength, reduce pain and improve overall QoL.    Time  2    Period  Weeks    Status  On-going    Target Date  12/08/19        PT Long Term Goals - 11/26/19 1426      PT LONG TERM GOAL #1   Title  Pt will tolerate , ambulating at 1.0 m/s speed with 6/10 pain at worst to return to personal walking program.    Time  4    Period  Weeks     Status  On-going      PT LONG TERM GOAL #2   Title  Pt will demonstrate 4/5 hip abduction strength and hip extensor strength bilaterally to reduce back pain and improve ability to ambulate and stand for longer durations.    Time  4    Period  Weeks    Status  On-going      PT LONG TERM GOAL #3   Title  Pt will self report 6/10 pain at worst with ambulating for 30 minutes to allow pt to complete grocery shopping and perform light household chores.    Time  4    Period  Weeks    Status  On-going            Plan - 12/04/19 1204    Clinical Impression Statement  pt still unwilling to attempt supine due to pain so continued all exercises in seated position with addition of heelsqueezes for core and glutes in prone.  Manual completed with most tightness and sensitivity noted in Rt upper lumbar paraspinals.    Personal Factors and Comorbidities  Comorbidity 1;Past/Current Experience;Time since onset of injury/illness/exacerbation    Comorbidities  anxiety, CHF, chronic back pain    Examination-Activity Limitations  Bed Mobility;Bend;Lift;Locomotion Level;Reach Overhead;Sit;Sleep;Stand    Examination-Participation Restrictions  Cleaning;Community Activity;Laundry;Shop    Stability/Clinical Decision Making  Evolving/Moderate complexity    Rehab Potential  Fair    PT Frequency  2x / week    PT Duration  4 weeks    PT Treatment/Interventions  ADLs/Self Care Home Management;Aquatic Therapy;Cryotherapy;Moist Heat;DME Instruction;Gait training;Stair training;Functional mobility training;Therapeutic activities;Therapeutic exercise;Balance training;Neuromuscular re-education;Patient/family education;Orthotic Fit/Training;Manual techniques;Passive range of motion;Taping;Joint Manipulations    PT Next Visit Plan  Continue core strengthening, glute strengthening and manual for pain control. Continue to assess pain with mobility.    PT Home Exercise Plan  Eval: seated HS stretch, L seated piriformis  stretch, TA activation in sidelying    Consulted and Agree with Plan of Care  Patient       Patient will benefit from skilled therapeutic intervention in order to improve the following deficits and impairments:  Abnormal gait,  Decreased activity tolerance, Decreased balance, Decreased endurance, Decreased mobility, Decreased range of motion, Decreased strength, Difficulty walking, Hypomobility, Increased muscle spasms, Impaired perceived functional ability, Impaired flexibility, Impaired sensation, Improper body mechanics, Postural dysfunction, Pain  Visit Diagnosis: Other abnormalities of gait and mobility  Muscle weakness (generalized)  Chronic bilateral low back pain with right-sided sciatica  Other symptoms and signs involving the musculoskeletal system     Problem List Patient Active Problem List   Diagnosis Date Noted  . Chronic pain syndrome 03/25/2019  . Encounter for long-term opiate analgesic use 03/03/2019  . Prediabetes 03/03/2019  . HTN (hypertension) 12/08/2016  . Sinusitis 06/27/2016  . Sciatica of right side 01/04/2016  . Hypokalemia 10/07/2015  . Migraine headache without aura 03/31/2015  . GERD (gastroesophageal reflux disease) 02/04/2015  . Chronic back pain 05/13/2013  . Allergic rhinitis 12/31/2012  . EDEMA 11/09/2010  . Hyperlipidemia 01/17/2010  . DEPRESSION/ANXIETY 01/17/2010  . CARDIOMYOPATHY, MILD 01/17/2010  . ABDOMINAL PAIN, UNSPECIFIED SITE 01/17/2010   Lurena Nida, PTA/CLT [phone]  Lurena Nida 12/04/2019, 1:36 PM  [facility] [address] [place], [place], [address] Phone: [phone]   Fax:  [phone]  Name: Pamela Mendez MRN: [id number] Date of Birth: 04/12/1966

## 2019-12-05 ENCOUNTER — Other Ambulatory Visit: Payer: Self-pay | Admitting: Family Medicine

## 2019-12-08 NOTE — Telephone Encounter (Signed)
90-day with 1 refill each °

## 2019-12-09 ENCOUNTER — Ambulatory Visit (HOSPITAL_COMMUNITY): Payer: Medicaid Other

## 2019-12-09 ENCOUNTER — Encounter (HOSPITAL_COMMUNITY): Payer: Self-pay

## 2019-12-09 ENCOUNTER — Other Ambulatory Visit: Payer: Self-pay

## 2019-12-09 DIAGNOSIS — R29898 Other symptoms and signs involving the musculoskeletal system: Secondary | ICD-10-CM

## 2019-12-09 DIAGNOSIS — M6281 Muscle weakness (generalized): Secondary | ICD-10-CM

## 2019-12-09 DIAGNOSIS — R2689 Other abnormalities of gait and mobility: Secondary | ICD-10-CM

## 2019-12-09 DIAGNOSIS — M5441 Lumbago with sciatica, right side: Secondary | ICD-10-CM

## 2019-12-09 DIAGNOSIS — G8929 Other chronic pain: Secondary | ICD-10-CM

## 2019-12-09 NOTE — Therapy (Signed)
[place], [place], [address] Phone: [phone]   Fax:  [phone]  Physical Therapy Treatment  Patient Details  Name: Pamela Mendez MRN: [id number] Date of Birth: 1965-11-01 Referring Provider (PT): Sallee Lange, MD   Encounter Date: 12/09/2019  PT End of Session - 12/09/19 1141    Visit Number  4    Number of Visits  8    Date for PT Re-Evaluation  12/22/19    Authorization Type  Medicaid    Authorization Time Period  11/24/19 to 12/22/19; (3 visits 3/23 - 4/5 approved)    Authorization - Visit Number  2   outside auth, doesn't count as visit   Authorization - Number of Visits  3    PT Start Time  1117    PT Stop Time  1145    PT Time Calculation (min)  28 min    Activity Tolerance  Patient tolerated treatment well;Patient limited by pain    Behavior During Therapy  [facility] for tasks assessed/performed       Past Medical History:  Diagnosis Date  . Abdominal pain    Unspecified site  . Anxiety   . Anxiety and depression   . 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 CHF (congestive heart failure) ([place]) 2004   post-partum  . Chronic back pain   . Chronic left hip pain   . Depression   . Gastritis   . GERD (gastroesophageal reflux disease)   . H/O echocardiogram 2012   EF 55-60%  . Hyperlipidemia   . Hypertension   . Migraine headache without aura   . Vertigo     Past Surgical History:  Procedure Laterality Date  . ABLATION    . Childbirth     time 3  . CHOLECYSTECTOMY  [address] , Dr Tamala Julian  . LUMBAR DISC SURGERY  2010   [facility], Dr Sherley Bounds  . LUMBAR DISC SURGERY  2005   [place], Dr Sherley Bounds    There were no vitals filed for this visit.  Subjective Assessment - 12/09/19 1122    Subjective  Pt reports no improvement or worsening of pain since starting therapy. Pt reports pain today isn't as bad as last time. Pt reports going to college track to walk, but pain  increased to 7-8/10 after walking for ~45 minutes.    Pertinent History  2005 and 2010 low back surgery    How long can you sit comfortably?  no issues if in recliner, in straight back chairs 10 minutes    How long can you stand comfortably?  2-3 minutes before needing to move    How long can you walk comfortably?  10 minutes    Diagnostic tests  MRI denied    Patient Stated Goals  "I don't know, I've tried physical therapy before and it didn't work"    Currently in Pain?  Yes    Pain Score  5     Pain Location  Back    Pain Orientation  Right;Left;Lower    Pain Descriptors / Indicators  Aching;Dull    Pain Type  Chronic pain    Pain Onset  More than a month ago    Pain Frequency  Constant    Aggravating Factors   sweeping/vacuuming, standing    Pain Relieving Factors  heating pad, rest, pain medication    Effect of Pain on Daily Activities  limited  [facility] Adult PT Treatment/Exercise - 12/09/19 0001      Lumbar Exercises: Stretches   Other Lumbar Stretch Exercise  seated trunk rotation, 10 reps x5 sec hold; seated trunk flexion/extension, x10 reps; seated lateral trunk flexion, x10 reps each direction      Lumbar Exercises: Seated   Other Seated Lumbar Exercises  TA activation with exhale      Manual Therapy   Manual Therapy  Soft tissue mobilization    Manual therapy comments  completed sepereate from rest of treatment interventions    Soft tissue mobilization  pt prone, green ball for instrument assisted STM to R lumbar paraspinals and R hip musculature, increased focus at iliac crest when glutes attach moderate pressure for pain relief             PT Education - 12/09/19 1157    Education Details  Exercise techinque, continue HEP and walking program    Person(s) Educated  Patient    Methods  Explanation    Comprehension  Verbalized understanding       PT Short Term Goals - 12/09/19 1142      PT SHORT TERM GOAL #1   Title  Pt will perform HEP at  least 3x/week to improve strength, reduce pain and improve overall QoL.    Baseline  4/6: 1-2x/day    Time  2    Period  Weeks    Status  Achieved    Target Date  12/08/19        PT Long Term Goals - 12/09/19 1142      PT LONG TERM GOAL #1   Title  Pt will tolerate , ambulating at 1.0 m/s speed with 6/10 pain at worst to return to personal walking program.    Time  4    Period  Weeks    Status  On-going      PT LONG TERM GOAL #2   Title  Pt will demonstrate 4/5 hip abduction strength and hip extensor strength bilaterally to reduce back pain and improve ability to ambulate and stand for longer durations.    Time  4    Period  Weeks    Status  On-going      PT LONG TERM GOAL #3   Title  Pt will self report 6/10 pain at worst with ambulating for 30 minutes to allow pt to complete grocery shopping and perform light household chores.    Baseline  --    Time  4    Period  Weeks    Status  On-going            Plan - 12/09/19 1159    Clinical Impression Statement  Began with STM to decrease pain, pt reports no improvement in pain after instrument assisted STM using green ball to lumbar paraspinals and glute musculature on R side. Pt continues to report pain with gentle AROM exercises and isometric core activation limiting therapy progression. Pt has not attended enough sessions at this time for significant gains to be evident and would continue to benefit from skilled PT interventions to improve deficits and reduce pain with functional mobility to improve overall QoL.    Personal Factors and Comorbidities  Comorbidity 1;Past/Current Experience;Time since onset of injury/illness/exacerbation    Comorbidities  anxiety, CHF, chronic back pain    Examination-Activity Limitations  Bed Mobility;Bend;Lift;Locomotion Level;Reach Overhead;Sit;Sleep;Stand    Examination-Participation Restrictions  Cleaning;Community Activity;Laundry;Shop    Stability/Clinical Decision Making   Evolving/Moderate complexity    Rehab  Potential  Fair    PT Frequency  2x / week    PT Duration  4 weeks    PT Treatment/Interventions  ADLs/Self Care Home Management;Aquatic Therapy;Cryotherapy;Moist Heat;DME Instruction;Gait training;Stair training;Functional mobility training;Therapeutic activities;Therapeutic exercise;Balance training;Neuromuscular re-education;Patient/family education;Orthotic Fit/Training;Manual techniques;Passive range of motion;Taping;Joint Manipulations    PT Next Visit Plan  Continue core strengthening, glute strengthening and manual for pain control. Continue to assess pain with mobility.    PT Home Exercise Plan  Eval: seated HS stretch, L seated piriformis stretch, TA activation in sidelying    Consulted and Agree with Plan of Care  Patient       Patient will benefit from skilled therapeutic intervention in order to improve the following deficits and impairments:  Abnormal gait, Decreased activity tolerance, Decreased balance, Decreased endurance, Decreased mobility, Decreased range of motion, Decreased strength, Difficulty walking, Hypomobility, Increased muscle spasms, Impaired perceived functional ability, Impaired flexibility, Impaired sensation, Improper body mechanics, Postural dysfunction, Pain  Visit Diagnosis: Chronic bilateral low back pain with right-sided sciatica  Muscle weakness (generalized)  Other abnormalities of gait and mobility  Other symptoms and signs involving the musculoskeletal system     Problem List Patient Active Problem List   Diagnosis Date Noted  . Chronic pain syndrome 03/25/2019  . Encounter for long-term opiate analgesic use 03/03/2019  . Prediabetes 03/03/2019  . HTN (hypertension) 12/08/2016  . Sinusitis 06/27/2016  . Sciatica of right side 01/04/2016  . Hypokalemia 10/07/2015  . Migraine headache without aura 03/31/2015  . GERD (gastroesophageal reflux disease) 02/04/2015  . Chronic back pain 05/13/2013  .  Allergic rhinitis 12/31/2012  . EDEMA 11/09/2010  . Hyperlipidemia 01/17/2010  . DEPRESSION/ANXIETY 01/17/2010  . CARDIOMYOPATHY, MILD 01/17/2010  . ABDOMINAL PAIN, UNSPECIFIED SITE 01/17/2010     Domenick Bookbinder PT, DPT 12/09/19, 12:50 PM [phone]  [facility] [address] [place], [place], [address] Phone: [phone]   Fax:  [phone]  Name: Pamela Mendez MRN: [id number] Date of Birth: 11/06/1965

## 2019-12-11 ENCOUNTER — Ambulatory Visit (HOSPITAL_COMMUNITY): Payer: Medicaid Other | Admitting: Physical Therapy

## 2019-12-16 ENCOUNTER — Telehealth (HOSPITAL_COMMUNITY): Payer: Self-pay

## 2019-12-16 ENCOUNTER — Ambulatory Visit (HOSPITAL_COMMUNITY): Payer: Medicaid Other

## 2019-12-16 NOTE — Telephone Encounter (Signed)
No Show#1. Called pt regarding missed appointment today and informed pt of next appointment. Wished pt well and left clinic phone number for pt to call if she has any questions or is unable to make next appointment.  Domenick Bookbinder PT, DPT 12/16/19, 11:48 AM [phone]

## 2019-12-18 ENCOUNTER — Ambulatory Visit (HOSPITAL_COMMUNITY): Payer: Medicaid Other | Admitting: Physical Therapy

## 2019-12-18 ENCOUNTER — Other Ambulatory Visit: Payer: Self-pay

## 2019-12-18 DIAGNOSIS — M5441 Lumbago with sciatica, right side: Secondary | ICD-10-CM | POA: Diagnosis not present

## 2019-12-18 DIAGNOSIS — G8929 Other chronic pain: Secondary | ICD-10-CM

## 2019-12-18 DIAGNOSIS — R2689 Other abnormalities of gait and mobility: Secondary | ICD-10-CM

## 2019-12-18 DIAGNOSIS — M6281 Muscle weakness (generalized): Secondary | ICD-10-CM

## 2019-12-18 DIAGNOSIS — R29898 Other symptoms and signs involving the musculoskeletal system: Secondary | ICD-10-CM | POA: Diagnosis not present

## 2019-12-18 NOTE — Therapy (Signed)
[place], [place], [address] Phone: [phone]   Fax:  [phone]  Physical Therapy Treatment  Patient Details  Name: Pamela Mendez MRN: [id number] Date of Birth: 1966/04/18 Referring Provider (PT): Sallee Lange, MD   Encounter Date: 12/18/2019  PT End of Session - 12/18/19 1221    Visit Number  5    Number of Visits  8    Date for PT Re-Evaluation  12/22/19    Authorization Type  Medicaid    Authorization Time Period  4/8-4/28 6 units approved    Authorization - Visit Number  1   outside auth, doesn't count as visit   Authorization - Number of Visits  6    Progress Note Due on Visit  8    PT Start Time  1133    PT Stop Time  1220    PT Time Calculation (min)  47 min    Activity Tolerance  Patient tolerated treatment well;Patient limited by pain    Behavior During Therapy  [facility] for tasks assessed/performed       Past Medical History:  Diagnosis Date  . Abdominal pain    Unspecified site  . Anxiety   . Anxiety and depression   . 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 CHF (congestive heart failure) ([place]) 2004   post-partum  . Chronic back pain   . Chronic left hip pain   . Depression   . Gastritis   . GERD (gastroesophageal reflux disease)   . H/O echocardiogram 2012   EF 55-60%  . Hyperlipidemia   . Hypertension   . Migraine headache without aura   . Vertigo     Past Surgical History:  Procedure Laterality Date  . ABLATION    . Childbirth     time 3  . CHOLECYSTECTOMY  [address] , Dr Tamala Julian  . LUMBAR DISC SURGERY  2010   [facility], Dr Sherley Bounds  . LUMBAR DISC SURGERY  2005   [place], Dr Sherley Bounds    There were no vitals filed for this visit.  Subjective Assessment - 12/18/19 1141    Subjective  Pt states her back pain comes and goes.  STates dampness in the air makes it ache.  Today it is flared up at 7/10.    Currently in Pain?  Yes    Pain Score  7     Pain Location  Back    Pain Orientation  Right;Left;Lower                       [facility] Adult PT Treatment/Exercise - 12/18/19 0001      Lumbar Exercises: Stretches   Active Hamstring Stretch  Left;2 reps;20 seconds    Active Hamstring Stretch Limitations  long seated    Other Lumbar Stretch Exercise  standing hip excursion       Lumbar Exercises: Seated   Other Seated Lumbar Exercises  TA activation with exhale      Lumbar Exercises: Prone   Other Prone Lumbar Exercises  heelqueezes 15X5"       Manual Therapy   Manual Therapy  Soft tissue mobilization    Manual therapy comments  completed sepereate from rest of treatment interventions    Soft tissue mobilization  pt prone, green ball for instrument assisted STM to R lumbar paraspinals and R hip musculature, increased focus at iliac crest when glutes attach moderate pressure  for pain relief               PT Short Term Goals - 12/09/19 1142      PT SHORT TERM GOAL #1   Title  Pt will perform HEP at least 3x/week to improve strength, reduce pain and improve overall QoL.    Baseline  4/6: 1-2x/day    Time  2    Period  Weeks    Status  Achieved    Target Date  12/08/19        PT Long Term Goals - 12/09/19 1142      PT LONG TERM GOAL #1   Title  Pt will tolerate , ambulating at 1.0 m/s speed with 6/10 pain at worst to return to personal walking program.    Time  4    Period  Weeks    Status  On-going      PT LONG TERM GOAL #2   Title  Pt will demonstrate 4/5 hip abduction strength and hip extensor strength bilaterally to reduce back pain and improve ability to ambulate and stand for longer durations.    Time  4    Period  Weeks    Status  On-going      PT LONG TERM GOAL #3   Title  Pt will self report 6/10 pain at worst with ambulating for 30 minutes to allow pt to complete grocery shopping and perform light household chores.    Baseline  --    Time  4    Period  Weeks    Status  On-going             Plan - 12/18/19 1223    Clinical Impression Statement  continued with established therex with manual completed at EOS. Resumed seated piriformis and hamstring stretches.  Pt with extreme tightness in both mm groups.  Pt reported overall improvement at end of session.    Personal Factors and Comorbidities  Comorbidity 1;Past/Current Experience;Time since onset of injury/illness/exacerbation    Comorbidities  anxiety, CHF, chronic back pain    Examination-Activity Limitations  Bed Mobility;Bend;Lift;Locomotion Level;Reach Overhead;Sit;Sleep;Stand    Examination-Participation Restrictions  Cleaning;Community Activity;Laundry;Shop    Stability/Clinical Decision Making  Evolving/Moderate complexity    Rehab Potential  Fair    PT Frequency  2x / week    PT Duration  4 weeks    PT Treatment/Interventions  ADLs/Self Care Home Management;Aquatic Therapy;Cryotherapy;Moist Heat;DME Instruction;Gait training;Stair training;Functional mobility training;Therapeutic activities;Therapeutic exercise;Balance training;Neuromuscular re-education;Patient/family education;Orthotic Fit/Training;Manual techniques;Passive range of motion;Taping;Joint Manipulations    PT Next Visit Plan  Continue core strengthening, glute strengthening and manual for pain control. Continue to assess pain with mobility.    PT Home Exercise Plan  Eval: seated HS stretch, L seated piriformis stretch, TA activation in sidelying    Consulted and Agree with Plan of Care  Patient       Patient will benefit from skilled therapeutic intervention in order to improve the following deficits and impairments:  Abnormal gait, Decreased activity tolerance, Decreased balance, Decreased endurance, Decreased mobility, Decreased range of motion, Decreased strength, Difficulty walking, Hypomobility, Increased muscle spasms, Impaired perceived functional ability, Impaired flexibility, Impaired sensation, Improper body mechanics, Postural  dysfunction, Pain  Visit Diagnosis: Chronic bilateral low back pain with right-sided sciatica  Muscle weakness (generalized)  Other abnormalities of gait and mobility     Problem List Patient Active Problem List   Diagnosis Date Noted  . Chronic pain syndrome 03/25/2019  . Encounter for long-term opiate analgesic  use 03/03/2019  . Prediabetes 03/03/2019  . HTN (hypertension) 12/08/2016  . Sinusitis 06/27/2016  . Sciatica of right side 01/04/2016  . Hypokalemia 10/07/2015  . Migraine headache without aura 03/31/2015  . GERD (gastroesophageal reflux disease) 02/04/2015  . Chronic back pain 05/13/2013  . Allergic rhinitis 12/31/2012  . EDEMA 11/09/2010  . Hyperlipidemia 01/17/2010  . DEPRESSION/ANXIETY 01/17/2010  . CARDIOMYOPATHY, MILD 01/17/2010  . ABDOMINAL PAIN, UNSPECIFIED SITE 01/17/2010   Lurena Nida, PTA/CLT [phone]  Lurena Nida 12/18/2019, 12:26 PM  [facility] [address] [place], [place], [address] Phone: [phone]   Fax:  [phone]  Name: ATIANA LEVIER MRN: [id number] Date of Birth: 12-10-1965

## 2019-12-23 ENCOUNTER — Other Ambulatory Visit: Payer: Self-pay

## 2019-12-23 ENCOUNTER — Ambulatory Visit (HOSPITAL_COMMUNITY): Payer: Medicaid Other

## 2019-12-23 ENCOUNTER — Encounter (HOSPITAL_COMMUNITY): Payer: Self-pay

## 2019-12-23 DIAGNOSIS — R2689 Other abnormalities of gait and mobility: Secondary | ICD-10-CM | POA: Diagnosis not present

## 2019-12-23 DIAGNOSIS — M6281 Muscle weakness (generalized): Secondary | ICD-10-CM | POA: Diagnosis not present

## 2019-12-23 DIAGNOSIS — R29898 Other symptoms and signs involving the musculoskeletal system: Secondary | ICD-10-CM

## 2019-12-23 DIAGNOSIS — G8929 Other chronic pain: Secondary | ICD-10-CM

## 2019-12-23 DIAGNOSIS — M5441 Lumbago with sciatica, right side: Secondary | ICD-10-CM | POA: Diagnosis not present

## 2019-12-23 NOTE — Therapy (Addendum)
[facility] [address] [place], [place], [address] Phone: [phone]   Fax:  [phone]  Physical Therapy Treatment  Patient Details  Name: Pamela Mendez MRN: [id number] Date of Birth: Oct 29, 1965 Referring Provider (PT): Lilyan Punt, MD   Encounter Date: 12/23/2019  PT End of Session - 12/23/19 1035    Visit Number  6    Number of Visits  8    Date for PT Re-Evaluation  12/22/19    Authorization Type  Medicaid    Authorization Time Period  4/8-4/28 6 units approved    Authorization - Visit Number  2    Authorization - Number of Visits  6    Progress Note Due on Visit  8    PT Start Time  1030    PT Stop Time  1113    PT Time Calculation (min)  43 min    Activity Tolerance  Patient tolerated treatment well    Behavior During Therapy  [facility] for tasks assessed/performed       Past Medical History:  Diagnosis Date  . Abdominal pain    Unspecified site  . Anxiety   . Anxiety and depression   . 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 CHF (congestive heart failure) (HCC) 2004   post-partum  . Chronic back pain   . Chronic left hip pain   . Depression   . Gastritis   . GERD (gastroesophageal reflux disease)   . H/O echocardiogram 2012   EF 55-60%  . Hyperlipidemia   . Hypertension   . Migraine headache without aura   . Vertigo     Past Surgical History:  Procedure Laterality Date  . ABLATION    . Childbirth     time 3  . CHOLECYSTECTOMY  [address] , Dr Katrinka Blazing  . LUMBAR DISC SURGERY  2010   [facility], Dr Marikay Alar  . LUMBAR DISC SURGERY  2005   [facility], Dr Marikay Alar    There were no vitals filed for this visit.  Subjective Assessment - 12/23/19 1032    Subjective  Pt reports no issues after last session. Pt reports feeling good today only 4 or 5/10 pain, maybe because the weather is nice.    Pertinent History  2005 and 2010 low back surgery    How long can you sit comfortably?  no issues if  in recliner, in straight back chairs 10 minutes    How long can you stand comfortably?  2-3 minutes before needing to move    How long can you walk comfortably?  10 minutes    Diagnostic tests  MRI denied    Patient Stated Goals  "I don't know, I've tried physical therapy before and it didn't work"    Currently in Pain?  Yes    Pain Score  5     Pain Location  Back    Pain Orientation  Right;Left;Lower    Pain Descriptors / Indicators  Aching;Dull    Pain Type  Chronic pain    Pain Radiating Towards  posterior of R thigh    Pain Onset  More than a month ago    Pain Frequency  Constant    Aggravating Factors   sweeping/vacuuming, standing    Pain Relieving Factors  heating pad, rest, pain medication    Effect of Pain on Daily Activities  limited  [facility] Adult PT Treatment/Exercise - 12/23/19 0001      Lumbar Exercises: Stretches   Active Hamstring Stretch  Right;Left;30 seconds    Active Hamstring Stretch Limitations  12" step: leg forward, across midline, outside midline      Lumbar Exercises: Standing   Other Standing Lumbar Exercises  standing abduction, x10 reps BLE    Other Standing Lumbar Exercises  forward steps ups to 8" step, x10 reps each leg, cues to push through heel      Lumbar Exercises: Seated   Other Seated Lumbar Exercises  TA activation with exhale, x20 reps with 5 sec holds    Other Seated Lumbar Exercises  seated pelvic tilts anterior/posterior, x10 reps each direction; seated hip abduction BTB, x15 reps      Manual Therapy   Manual Therapy  Soft tissue mobilization    Manual therapy comments  completed sepereate from rest of treatment interventions    Soft tissue mobilization  pt prone, green ball for instrument assisted STM to R lumbar paraspinals and R hip musculature, increased focus at piriformis, moderate pressure for pain relief             PT Education - 12/23/19 1035    Education Details  Exercise technique, updated HEP,  prepare for d/c next session due to plateau of progress    Person(s) Educated  Patient    Methods  Explanation;Demonstration    Comprehension  Verbalized understanding;Returned demonstration       PT Short Term Goals - 12/09/19 1142      PT SHORT TERM GOAL #1   Title  Pt will perform HEP at least 3x/week to improve strength, reduce pain and improve overall QoL.    Baseline  4/6: 1-2x/day    Time  2    Period  Weeks    Status  Achieved    Target Date  12/08/19        PT Long Term Goals - 12/09/19 1142      PT LONG TERM GOAL #1   Title  Pt will tolerate , ambulating at 1.0 m/s speed with 6/10 pain at worst to return to personal walking program.    Time  4    Period  Weeks    Status  On-going      PT LONG TERM GOAL #2   Title  Pt will demonstrate 4/5 hip abduction strength and hip extensor strength bilaterally to reduce back pain and improve ability to ambulate and stand for longer durations.    Time  4    Period  Weeks    Status  On-going      PT LONG TERM GOAL #3   Title  Pt will self report 6/10 pain at worst with ambulating for 30 minutes to allow pt to complete grocery shopping and perform light household chores.    Baseline  --    Time  4    Period  Weeks    Status  On-going            Plan - 12/23/19 1116    Clinical Impression Statement  Pt with good abdominal activation, but limited due to strengthening in sitting position for pt comfort. Added glute strengthening with step ups and standing abduction requiring intermittent cues for form to engage hamstrings and glutes more effectively. Pt with increased low back/hip pain with standing exercises. Ended with manual STM using green ball to R lumbar paraspinals and R hip musculature with increased time spent on piriformis  and pt reports improvement in pain after manual. Pt continues to report 5/10 pain at EOS. Attempted to add appointment to this week and 2 next week, but doe to scheduling conflicts pt only  coming once this week and once next week. Plan to d/c next session due to lack of progress with skilled PT interventions and pt being independent with exercising in personal pool at home.    Personal Factors and Comorbidities  Comorbidity 1;Past/Current Experience;Time since onset of injury/illness/exacerbation    Comorbidities  anxiety, CHF, chronic back pain    Examination-Activity Limitations  Bed Mobility;Bend;Lift;Locomotion Level;Reach Overhead;Sit;Sleep;Stand    Examination-Participation Restrictions  Cleaning;Community Activity;Laundry;Shop    Stability/Clinical Decision Making  Evolving/Moderate complexity    Rehab Potential  Fair    PT Frequency  2x / week    PT Duration  4 weeks    PT Treatment/Interventions  ADLs/Self Care Home Management;Aquatic Therapy;Cryotherapy;Moist Heat;DME Instruction;Gait training;Stair training;Functional mobility training;Therapeutic activities;Therapeutic exercise;Balance training;Neuromuscular re-education;Patient/family education;Orthotic Fit/Training;Manual techniques;Passive range of motion;Taping;Joint Manipulations    PT Next Visit Plan  Prepare for d/c, finalize HEP.    PT Home Exercise Plan  Eval: seated HS stretch, L seated piriformis stretch, TA activation in sidelying; 4/20: steps ups to step, HS stretch on step    Consulted and Agree with Plan of Care  Patient       Patient will benefit from skilled therapeutic intervention in order to improve the following deficits and impairments:  Abnormal gait, Decreased activity tolerance, Decreased balance, Decreased endurance, Decreased mobility, Decreased range of motion, Decreased strength, Difficulty walking, Hypomobility, Increased muscle spasms, Impaired perceived functional ability, Impaired flexibility, Impaired sensation, Improper body mechanics, Postural dysfunction, Pain  Visit Diagnosis: Chronic bilateral low back pain with right-sided sciatica  Muscle weakness (generalized)  Other  abnormalities of gait and mobility  Other symptoms and signs involving the musculoskeletal system     Problem List Patient Active Problem List   Diagnosis Date Noted  . Chronic pain syndrome 03/25/2019  . Encounter for long-term opiate analgesic use 03/03/2019  . Prediabetes 03/03/2019  . HTN (hypertension) 12/08/2016  . Sinusitis 06/27/2016  . Sciatica of right side 01/04/2016  . Hypokalemia 10/07/2015  . Migraine headache without aura 03/31/2015  . GERD (gastroesophageal reflux disease) 02/04/2015  . Chronic back pain 05/13/2013  . Allergic rhinitis 12/31/2012  . EDEMA 11/09/2010  . Hyperlipidemia 01/17/2010  . DEPRESSION/ANXIETY 01/17/2010  . CARDIOMYOPATHY, MILD 01/17/2010  . ABDOMINAL PAIN, UNSPECIFIED SITE 01/17/2010    Talbot Grumbling PT, DPT 12/23/19, 11:48 AM [address] [place], [place], [address] Phone: [phone]   Fax:  [phone]  Name: Pamela Mendez MRN: [id number] Date of Birth: 04/15/1966

## 2019-12-25 DIAGNOSIS — H524 Presbyopia: Secondary | ICD-10-CM | POA: Diagnosis not present

## 2019-12-25 DIAGNOSIS — H52221 Regular astigmatism, right eye: Secondary | ICD-10-CM | POA: Diagnosis not present

## 2020-01-12 ENCOUNTER — Other Ambulatory Visit: Payer: Self-pay

## 2020-01-12 ENCOUNTER — Encounter: Payer: Self-pay | Admitting: Family Medicine

## 2020-01-12 ENCOUNTER — Ambulatory Visit: Payer: Medicaid Other | Admitting: Family Medicine

## 2020-01-12 VITALS — BP 128/82 | HR 101 | Temp 97.5°F | Ht 66.0 in | Wt 215.0 lb

## 2020-01-12 DIAGNOSIS — K644 Residual hemorrhoidal skin tags: Secondary | ICD-10-CM

## 2020-01-12 MED ORDER — LIDOCAINE (ANORECTAL) 5 % EX GEL: 1.0000 "application " | Freq: Four times a day (QID) | CUTANEOUS | 0 refills | Status: DC | PRN

## 2020-01-12 MED ORDER — HYDROCORTISONE ACETATE 25 MG RE SUPP: 25.0000 mg | Freq: Two times a day (BID) | RECTAL | 0 refills | Status: DC

## 2020-01-12 MED ORDER — HYDROCORT-PRAMOXINE (PERIANAL) 1-1 % EX FOAM: 1.0000 | Freq: Two times a day (BID) | CUTANEOUS | 0 refills | Status: DC

## 2020-01-12 NOTE — Patient Instructions (Addendum)
Do sitz bath 3-4x per day for . Topically use lidocaine gel on hemorrhoids.   Use either the suppositories or foam for the hemorrhoids. Call or return if not improving over the next 3-5 days.    Hemorrhoids Hemorrhoids are swollen veins that may develop:  In the butt (rectum). These are called internal hemorrhoids.  Around the opening of the butt (anus). These are called external hemorrhoids. Hemorrhoids can cause pain, itching, or bleeding. Most of the time, they do not cause serious problems. They usually get better with diet changes, lifestyle changes, and other home treatments. What are the causes? This condition may be caused by:  Having trouble pooping (constipation).  Pushing hard (straining) to poop.  Watery poop (diarrhea).  Pregnancy.  Being very overweight (obese).  Sitting for long periods of time.  Heavy lifting or other activity that causes you to strain.  Anal sex.   Riding a bike for a long period of time. What are the signs or symptoms? Symptoms of this condition include:  Pain.  Itching or soreness in the butt.  Bleeding from the butt.  Leaking poop.  Swelling in the area.  One or more lumps around the opening of your butt. How is this diagnosed? A doctor can often diagnose this condition by looking at the affected area. The doctor may also:  Do an exam that involves feeling the area with a gloved hand (digital rectal exam).  Examine the area inside your butt using a small tube (anoscope).  Order blood tests. This may be done if you have lost a lot of blood.  Have you get a test that involves looking inside the colon using a flexible tube with a camera on the end (sigmoidoscopy or colonoscopy). How is this treated? This condition can usually be treated at home. Your doctor may tell you to change what you eat, make lifestyle changes, or try home treatments. If these do not help, procedures can be done to remove the hemorrhoids or make  them smaller. These may involve:  Placing rubber bands at the base of the hemorrhoids to cut off their blood supply.  Injecting medicine into the hemorrhoids to shrink them.  Shining a type of light energy onto the hemorrhoids to cause them to fall off.  Doing surgery to remove the hemorrhoids or cut off their blood supply. Follow these instructions at home: Eating and drinking   Eat foods that have a lot of fiber in them. These include whole grains, beans, nuts, fruits, and vegetables.  Ask your doctor about taking products that have added fiber (fibersupplements).  Reduce the amount of fat in your diet. You can do this by: ? Eating low-fat dairy products. ? Eating less red meat. ? Avoiding processed foods.  Drink enough fluid to keep your pee (urine) pale yellow. Managing pain and swelling   Take a warm-water bath (sitz bath) for 20 minutes to ease pain. Do this 3-4 times a day. You may do this in a bathtub or using a portable sitz bath that fits over the toilet.  If told, put ice on the painful area. It may be helpful to use ice between your warm baths. ? Put ice in a plastic bag. ? Place a towel between your skin and the bag. ? Leave the ice on for 20 minutes, 2-3 times a day. General instructions  Take over-the-counter and prescription medicines only as told by your doctor. ? Medicated creams and medicines may be used as told.  Exercise often.  Ask your doctor how much and what kind of exercise is best for you.  Go to the bathroom when you have the urge to poop. Do not wait.  Avoid pushing too hard when you poop.  Keep your butt dry and clean. Use wet toilet paper or moist towelettes after pooping.  Do not sit on the toilet for a long time.  Keep all follow-up visits as told by your doctor. This is important. Contact a doctor if you:  Have pain and swelling that do not get better with treatment or medicine.  Have trouble pooping.  Cannot poop.  Have pain or  swelling outside the area of the hemorrhoids. Get help right away if you have:  Bleeding that will not stop. Summary  Hemorrhoids are swollen veins in the butt or around the opening of the butt.  They can cause pain, itching, or bleeding.  Eat foods that have a lot of fiber in them. These include whole grains, beans, nuts, fruits, and vegetables.  Take a warm-water bath (sitz bath) for 20 minutes to ease pain. Do this 3-4 times a day. This information is not intended to replace advice given to you by your health care provider. Make sure you discuss any questions you have with your health care provider. Document Revised: 08/29/2018 Document Reviewed: 01/10/2018 Elsevier Patient Education  [place].

## 2020-01-12 NOTE — Progress Notes (Signed)
Pt noticed a knot on rectum 2 days ago. Painful when walking and when using the bathroom.     Patient ID: Pamela Mendez, female    DOB: [birth year], 54 y.o.   MRN: [id number]   Chief Complaint  Patient presents with  . Hemorrhoids   Subjective:    HPI Pt seen for concern of ext hemorrhoid. No bleeding or constipation. pt does take pain medication chronically for back pain.  Has been on percocet chronically.  Doesn't usually cause any issues with constipation.  She states no straining recently with BMs.   But over the weekend was doing heavy lifting of tree logs, 2 days ago. Painful to sit or pass BM.  Was able to get a bm passed today.  Used topical preparation H, w/o much relief.  No h/o hemorrhoids in the past.   Medical History Pamela Mendez has a past medical history of Abdominal pain, Anxiety, Anxiety and depression, Cardiomyopathy (2004), CHF (congestive heart failure) ([place]) (2004), Chronic back pain, Chronic left hip pain, Depression, Gastritis, GERD (gastroesophageal reflux disease), H/O echocardiogram (2012), Hyperlipidemia, Hypertension, Migraine headache without aura, and Vertigo.   Outpatient Encounter Medications as of 01/12/2020  Medication Sig  . albuterol (VENTOLIN HFA) 108 (90 Base) MCG/ACT inhaler INHALE 2 PUFFS INTO THE LUNGS EVERY 6 HOURS AS NEEDED FOR WHEEZING.  Marland Kitchen amLODipine (NORVASC) 5 MG tablet Take 1 tablet (5 mg total) by mouth daily.  . cetirizine (ZYRTEC) 10 MG tablet TAKE ONE TABLET BY MOUTH AT BEDTIME AS NEEDED FOR ALLERGIES.  . fluticasone (FLONASE) 50 MCG/ACT nasal spray Place 2 sprays into both nostrils daily.  Marland Kitchen gabapentin (NEURONTIN) 300 MG capsule TAKE 2 CAPSULES BY MOUTH IN THE MORNING, 1 CAPSULE IN THE AFTERNOON, 2 CAPSULES IN THE EVENING.  Marland Kitchen lisinopril (ZESTRIL) 40 MG tablet TAKE 1 TABLET BY MOUTH DAILY.  . Multiple Vitamin (MULTIVITAMIN WITH MINERALS) TABS tablet Take 1 tablet by mouth daily.  . naproxen (NAPROSYN) 500 MG tablet TAKE (1) TABLET BY  MOUTH TWICE DAILY WITH A MEAL.  Marland Kitchen Olopatadine HCl 0.2 % SOLN Apply 1 drop to eye at bedtime as needed.  Marland Kitchen oxyCODONE-acetaminophen (PERCOCET/ROXICET) 5-325 MG tablet 1 pill 4 times daily as needed severe pain caution drowsiness  . oxyCODONE-acetaminophen (PERCOCET/ROXICET) 5-325 MG tablet 1 pill 4 times daily as needed severe pain caution drowsiness  . oxyCODONE-acetaminophen (PERCOCET/ROXICET) 5-325 MG tablet 1 pill 4 times daily as needed severe pain caution drowsiness  . pantoprazole (PROTONIX) 40 MG tablet TAKE ONE TABLET BY MOUTH DAILY.  Marland Kitchen potassium chloride SA (KLOR-CON M20) 20 MEQ tablet Take 2 tablets qam 2 tablets at noon and 2 in the evening.  . rosuvastatin (CRESTOR) 20 MG tablet TAKE ONE TABLET BY MOUTH ONCE DAILY.  Marland Kitchen torsemide (DEMADEX) 20 MG tablet TAKE THREE TABLETS BY MOUTH IN THE MORNING AND TWO AT NOON  . hydrocortisone (ANUSOL-HC) 25 MG suppository Place 1 suppository (25 mg total) rectally 2 (two) times daily.  . hydrocortisone-pramoxine (PROCTOFOAM-HC) rectal foam Place 1 applicator rectally 2 (two) times daily.  . Lidocaine, Anorectal, 5 % GEL Apply 1 application topically 4 (four) times daily as needed (hemorroids).  . [DISCONTINUED] doxycycline (VIBRA-TABS) 100 MG tablet Take one tablet po BID for 7 days   No facility-administered encounter medications on file as of 01/12/2020.     Review of Systems  Constitutional: Negative for chills and fever.  Gastrointestinal: Positive for rectal pain. Negative for abdominal pain, anal bleeding, blood in stool, constipation, diarrhea, nausea and vomiting.  Skin: Negative  for rash and wound.     Vitals BP 128/82   Pulse (!) 101   Temp (!) 97.5 F (36.4 C)   Ht 5\' 6"  (1.676 m)   Wt 215 lb (97.5 kg)   SpO2 97%   BMI 34.70 kg/m   Objective:   Physical Exam Constitutional:      General: She is not in acute distress.    Appearance: Normal appearance. She is not ill-appearing.  Pulmonary:     Effort: Pulmonary effort is  normal. No respiratory distress.  Genitourinary:    Rectum: Tenderness and external hemorrhoid (2 cm external hemorrhoid, not thrombosed, no bleeding at 12 oclock) present.  Musculoskeletal:        General: Normal range of motion.  Skin:    General: Skin is warm and dry.  Neurological:     General: No focal deficit present.     Mental Status: She is alert and oriented to person, place, and time.  Psychiatric:        Mood and Affect: Mood normal.        Behavior: Behavior normal.      Assessment and Plan   1. External hemorrhoid - hydrocortisone (ANUSOL-HC) 25 MG suppository; Place 1 suppository (25 mg total) rectally 2 (two) times daily.  Dispense: 12 suppository; Refill: 0 - hydrocortisone-pramoxine (PROCTOFOAM-HC) rectal foam; Place 1 applicator rectally 2 (two) times daily.  Dispense: 10 g; Refill: 0 - Lidocaine, Anorectal, 5 % GEL; Apply 1 application topically 4 (four) times daily as needed (hemorroids).  Dispense: 30 g; Refill: 0    Pt given handout on hemorrhoids.  Sitz bath 3-4x per day. Apply lidocaine cream and prep H prn.  Gave prep H suppositories or proctofoam to use prn.  High fiber diet and avoid heavy lifting. Increase water intake. If not improving in next 3-5 days, pt to call or rto.  May need referral to gen surgery or colorectal surgery.  Pt in agreement.   F/u prn.

## 2020-02-20 ENCOUNTER — Other Ambulatory Visit: Payer: Self-pay

## 2020-02-20 ENCOUNTER — Telehealth (INDEPENDENT_AMBULATORY_CARE_PROVIDER_SITE_OTHER): Payer: Medicaid Other | Admitting: Nurse Practitioner

## 2020-02-20 DIAGNOSIS — J0100 Acute maxillary sinusitis, unspecified: Secondary | ICD-10-CM

## 2020-02-20 MED ORDER — DOXYCYCLINE HYCLATE 100 MG PO TABS: 100.0000 mg | ORAL_TABLET | Freq: Two times a day (BID) | ORAL | 0 refills | Status: DC

## 2020-02-20 NOTE — Progress Notes (Signed)
   Subjective:    Patient ID: Pamela Mendez, female    DOB: 1966/05/17, 54 y.o.   MRN: [id number]  Sinusitis This is a new problem. Episode onset: several days. Associated symptoms include congestion, headaches and sinus pressure. Treatments tried: alka seltzer severe sinus.   Virtual Visit via Video Note  I connected with [name] on 02/20/20 at  2:20 PM EDT by a video enabled telemedicine application and verified that I am speaking with the correct person using two identifiers.  Location: Patient: home Provider: office   I discussed the limitations of evaluation and management by telemedicine and the availability of in person appointments. The patient expressed understanding and agreed to proceed.  History of Present Illness: Presents for complaints of worsening sinus symptoms over the past 4 to 5 days.  Pressure behind her eyes.  Head congestion.  Producing slight mucus at times, yellow-brown in color.  No fever.  No sore throat.  No ear pain but some pressure and popping.  Minimal cough.  Minimal relief with OTC sinus medication and Flonase.  Taking fluids well.  Voiding normal limit.   Observations/Objective: Format  Patient present at home Provider present at office Consent for interaction obtained Coronavirus outbreak made virtual visit necessary  NAD.  Alert, oriented.  Describes pain when she percusses along the maxillary sinus area bilaterally.  Allergic shiners noted.  No cough or audible wheezing noted.  Assessment and Plan: Acute non-recurrent maxillary sinusitis  Meds ordered this encounter  Medications  . doxycycline (VIBRA-TABS) 100 MG tablet    Sig: Take 1 tablet (100 mg total) by mouth 2 (two) times daily.    Dispense:  20 tablet    Refill:  0    Order Specific Question:   Supervising Provider    Answer:   Lilyan Punt A [[id number]]     Follow Up Instructions: Continue OTC meds as directed.  Call back in 7 to 10 days if no improvement, sooner if  worse. Recommend preventive health physical this year.   I discussed the assessment and treatment plan with the patient. The patient was provided an opportunity to ask questions and all were answered. The patient agreed with the plan and demonstrated an understanding of the instructions.   The patient was advised to call back or seek an in-person evaluation if the symptoms worsen or if the condition fails to improve as anticipated.  I provided 15 minutes of non-face-to-face time during this encounter.     Review of Systems  HENT: Positive for congestion and sinus pressure.   Neurological: Positive for headaches.       Objective:   Physical Exam        Assessment & Plan:

## 2020-02-21 ENCOUNTER — Encounter: Payer: Self-pay | Admitting: Nurse Practitioner

## 2020-03-01 ENCOUNTER — Encounter: Payer: Self-pay | Admitting: Family Medicine

## 2020-03-01 ENCOUNTER — Other Ambulatory Visit: Payer: Self-pay | Admitting: Family Medicine

## 2020-03-01 NOTE — Telephone Encounter (Signed)
Pt made appt earliest we could get her in is 03/24/2020

## 2020-03-02 ENCOUNTER — Encounter: Payer: Self-pay | Admitting: Family Medicine

## 2020-03-02 NOTE — Telephone Encounter (Signed)
Seen 6/18 for sinus and states she finished doxy and sinus pressure is a little worse now since mowing the grass. States it never cleared up while on doxy. Would like to know if something else could be called in to [place] [facility]. No fever or sob. Just sinus pressure and pressure behind left eye. Also see message below about appt. Pt states she will run out of oxycodone either Saturday or Sunday and has enough gabapentin to last through tomorrow and then will be out. [place] [facility].

## 2020-03-03 ENCOUNTER — Other Ambulatory Visit: Payer: Self-pay | Admitting: Family Medicine

## 2020-03-03 MED ORDER — GABAPENTIN 300 MG PO CAPS: ORAL_CAPSULE | ORAL | 0 refills | Status: DC

## 2020-03-03 MED ORDER — CLINDAMYCIN HCL 300 MG PO CAPS: 300.0000 mg | ORAL_CAPSULE | Freq: Three times a day (TID) | ORAL | 0 refills | Status: DC

## 2020-03-03 MED ORDER — OXYCODONE-ACETAMINOPHEN 5-325 MG PO TABS: ORAL_TABLET | ORAL | 0 refills | Status: DC

## 2020-03-05 ENCOUNTER — Other Ambulatory Visit: Payer: Self-pay | Admitting: Family Medicine

## 2020-03-24 ENCOUNTER — Other Ambulatory Visit: Payer: Self-pay

## 2020-03-24 ENCOUNTER — Ambulatory Visit: Payer: Medicaid Other | Admitting: Family Medicine

## 2020-03-24 ENCOUNTER — Encounter: Payer: Self-pay | Admitting: Family Medicine

## 2020-03-24 VITALS — BP 132/84 | Temp 97.8°F | Wt 216.6 lb

## 2020-03-24 DIAGNOSIS — J301 Allergic rhinitis due to pollen: Secondary | ICD-10-CM | POA: Diagnosis not present

## 2020-03-24 DIAGNOSIS — R7303 Prediabetes: Secondary | ICD-10-CM

## 2020-03-24 DIAGNOSIS — E7849 Other hyperlipidemia: Secondary | ICD-10-CM | POA: Diagnosis not present

## 2020-03-24 DIAGNOSIS — Z79891 Long term (current) use of opiate analgesic: Secondary | ICD-10-CM | POA: Diagnosis not present

## 2020-03-24 DIAGNOSIS — G8929 Other chronic pain: Secondary | ICD-10-CM

## 2020-03-24 DIAGNOSIS — I10 Essential (primary) hypertension: Secondary | ICD-10-CM | POA: Diagnosis not present

## 2020-03-24 DIAGNOSIS — M5440 Lumbago with sciatica, unspecified side: Secondary | ICD-10-CM | POA: Diagnosis not present

## 2020-03-24 DIAGNOSIS — G894 Chronic pain syndrome: Secondary | ICD-10-CM

## 2020-03-24 MED ORDER — METHYLPREDNISOLONE ACETATE 40 MG/ML IJ SUSP
40.0000 mg | Freq: Once | INTRAMUSCULAR | Status: AC
  Administered 2020-03-24: 40 mg via INTRAMUSCULAR

## 2020-03-24 MED ORDER — OXYCODONE-ACETAMINOPHEN 5-325 MG PO TABS: ORAL_TABLET | ORAL | 0 refills | Status: DC

## 2020-03-24 MED ORDER — AZELASTINE HCL 0.1 % NA SOLN
2.0000 | Freq: Two times a day (BID) | NASAL | 12 refills | Status: AC
Start: 2020-03-24 — End: ?

## 2020-03-24 NOTE — Progress Notes (Addendum)
   Subjective:    Patient ID: Pamela Mendez, female    DOB: 12/28/1965, 54 y.o.   MRN: [id number]  HPI This patient was seen today for chronic pain  The medication list was reviewed and updated.   -Compliance with medication: oxycodone 5-325 mg   - Number patient states they take daily: 4   -when was the last dose patient took? This morning   The patient was advised the importance of maintaining medication and not using illegal substances with these.  Here for refills and follow up  The patient was educated that we can provide 3 monthly scripts for their medication, it is their responsibility to follow the instructions.  Side effects or complications from medications: none  Patient is aware that pain medications are meant to minimize the severity of the pain to allow their pain levels to improve to allow for better function. They are aware of that pain medications cannot totally remove their pain.  Due for UDT ( at least once per year) : completed today   Scale of 1 to 10 ( 1 is least 10 is most) Your pain level without the medicine:8-9 Your pain level with medication 4-5  Scale 1 to 10 ( 1-helps very little, 10 helps very well) How well does your pain medication reduce your pain so you can function better through out the day? 10  Patient has had numerous back surgeries.  She has not gotten benefit through the surgeries anti-inflammatories or physical therapy has been on pain medicine reliably for years Continued use of pain medication is medically indicated Patient is under a pain medicine contract Her prescribing habits fall under the guidelines of the [facility] medical board in [place] acceptable standard of care.  Patient will need pain medications long-term.  Medication count today was accurate     Review of Systems     Objective:   Physical Exam  Lungs clear heart regular subjective low back pain subjective low back pain with straight leg raise on the right  blood pressure good      Assessment & Plan:  The patient was seen in followup for chronic pain. A review over at their current pain status was discussed. Drug registry was checked. Prescriptions were given.  Regular follow-up recommended. Discussion was held regarding the importance of compliance with medication as well as pain medication contract.  Patient was informed that medication may cause drowsiness and should not be combined  with other medications/alcohol or street drugs. If the patient feels medication is causing altered alertness then do not drive or operate dangerous equipment.  Prescriptions were sent in Patient was encouraged to try to taper off the gabapentin to see if it really helps Patient to give [place] feedback Follow-up if problems

## 2020-03-27 LAB — TOXASSURE SELECT 13 (MW), URINE

## 2020-04-14 ENCOUNTER — Telehealth: Payer: Self-pay | Admitting: Family Medicine

## 2020-04-14 NOTE — Telephone Encounter (Signed)
PA form filled out for patient Oxycodone-Acetaminophen 5-325 mg. Form in provider office for review and signature. Please advise. Thank you  (will also need to fax most recent office visit with form)

## 2020-04-16 NOTE — Telephone Encounter (Signed)
Form faxed along with office visit note. Await decision from insurance

## 2020-04-16 NOTE — Telephone Encounter (Signed)
The form was signed, please also see documentation on her last office visit

## 2020-04-20 NOTE — Telephone Encounter (Signed)
Form with additional questions sent in. Dr Lorin Picket to sign and then will be faxed.

## 2020-05-11 ENCOUNTER — Other Ambulatory Visit: Payer: Self-pay | Admitting: Family Medicine

## 2020-06-02 ENCOUNTER — Other Ambulatory Visit: Payer: Self-pay

## 2020-06-02 ENCOUNTER — Telehealth (INDEPENDENT_AMBULATORY_CARE_PROVIDER_SITE_OTHER): Payer: Medicaid Other | Admitting: Family Medicine

## 2020-06-02 DIAGNOSIS — L209 Atopic dermatitis, unspecified: Secondary | ICD-10-CM | POA: Diagnosis not present

## 2020-06-02 DIAGNOSIS — J0181 Other acute recurrent sinusitis: Secondary | ICD-10-CM | POA: Diagnosis not present

## 2020-06-02 MED ORDER — TRIAMCINOLONE ACETONIDE 0.1 % EX CREA
1.0000 | TOPICAL_CREAM | Freq: Two times a day (BID) | CUTANEOUS | 1 refills | Status: DC
Start: 2020-06-02 — End: 2021-04-12

## 2020-06-02 MED ORDER — DOXYCYCLINE HYCLATE 100 MG PO TABS
100.0000 mg | ORAL_TABLET | Freq: Two times a day (BID) | ORAL | 0 refills | Status: DC
Start: 2020-06-02 — End: 2020-06-24

## 2020-06-02 NOTE — Progress Notes (Signed)
   Subjective:    Patient ID: Pamela Mendez, female    DOB: 11-09-1965, 54 y.o.   MRN: [id number]  Sinusitis This is a new problem. Episode onset: 2 weeks. There has been no fever. Associated symptoms include congestion, ear pain and sinus pressure. Treatments tried: mucinex.  Significant head congestion drainage sinus pressure past couple weeks has long history of sinus states she has not been around anyone does not feel she has Covid has had previous x-rays denies any chest tightness pressure pain or shortness of breath Patient reports eczema on left on arm.  Mainly on left arm itching burning no blistering   Review of Systems  HENT: Positive for congestion, ear pain and sinus pressure.    See above    Objective:   Physical Exam  Today's visit was via telephone Physical exam was not possible for this visit       Assessment & Plan:  Eczema condition recommend triamcinolone twice daily as needed if ongoing troubles in person visit  Rhinosinusitis antibiotics prescribed warning signs discussed if progressive symptoms recommend recheck pulse ox with Covid test

## 2020-06-04 ENCOUNTER — Other Ambulatory Visit: Payer: Self-pay | Admitting: Family Medicine

## 2020-06-08 ENCOUNTER — Other Ambulatory Visit: Payer: Self-pay | Admitting: Family Medicine

## 2020-06-08 NOTE — Telephone Encounter (Signed)
6 months on each 

## 2020-06-24 ENCOUNTER — Ambulatory Visit: Payer: Medicaid Other | Admitting: Family Medicine

## 2020-06-24 ENCOUNTER — Other Ambulatory Visit: Payer: Self-pay

## 2020-06-24 ENCOUNTER — Encounter: Payer: Self-pay | Admitting: Family Medicine

## 2020-06-24 VITALS — BP 132/88 | HR 109 | Temp 97.0°F | Ht 66.0 in | Wt 214.0 lb

## 2020-06-24 DIAGNOSIS — E7849 Other hyperlipidemia: Secondary | ICD-10-CM

## 2020-06-24 DIAGNOSIS — R600 Localized edema: Secondary | ICD-10-CM | POA: Diagnosis not present

## 2020-06-24 DIAGNOSIS — I10 Essential (primary) hypertension: Secondary | ICD-10-CM | POA: Diagnosis not present

## 2020-06-24 DIAGNOSIS — G894 Chronic pain syndrome: Secondary | ICD-10-CM | POA: Diagnosis not present

## 2020-06-24 DIAGNOSIS — M5440 Lumbago with sciatica, unspecified side: Secondary | ICD-10-CM | POA: Diagnosis not present

## 2020-06-24 DIAGNOSIS — J019 Acute sinusitis, unspecified: Secondary | ICD-10-CM | POA: Diagnosis not present

## 2020-06-24 DIAGNOSIS — Z23 Encounter for immunization: Secondary | ICD-10-CM | POA: Diagnosis not present

## 2020-06-24 DIAGNOSIS — G8929 Other chronic pain: Secondary | ICD-10-CM | POA: Diagnosis not present

## 2020-06-24 MED ORDER — OXYCODONE-ACETAMINOPHEN 5-325 MG PO TABS
ORAL_TABLET | ORAL | 0 refills | Status: DC
Start: 2020-06-24 — End: 2020-09-23

## 2020-06-24 MED ORDER — DOXYCYCLINE HYCLATE 100 MG PO TABS: 100.0000 mg | ORAL_TABLET | Freq: Two times a day (BID) | ORAL | 0 refills | Status: DC

## 2020-06-24 NOTE — Progress Notes (Signed)
Subjective:    Patient ID: Pamela Mendez, female    DOB: [birth year], 54 y.o.   MRN: [id number]  HPI This patient was seen today for chronic pain  The medication list was reviewed and updated.   -Compliance with medication: takes 4 a day  - Number patient states they take daily: 4   -when was the last dose patient took? yesterday  The patient was advised the importance of maintaining medication and not using illegal substances with these.  Here for refills and follow up  The patient was educated that we can provide 3 monthly scripts for their medication, it is their responsibility to follow the instructions.  Side effects or complications from medications: none  Patient is aware that pain medications are meant to minimize the severity of the pain to allow their pain levels to improve to allow for better function. They are aware of that pain medications cannot totally remove their pain.  Due for UDT ( at least once per year) : last one done was 03/24/20  Scale of 1 to 10 ( 1 is least 10 is most) Your pain level without the medicine: 7-8 Your pain level with medication: 2-3  Scale 1 to 10 ( 1-helps very little, 10 helps very well) How well does your pain medication reduce your pain so you can function better through out the day? 26  Sinus pressure behind right eye. Started 3 days ago. Taking alka seltzer severe sinus.  Patient relates a lot of head congestion sinus pressure on the right side denies high fever chills sweats  Primary hypertension  Localized edema  Other hyperlipidemia  Chronic low back pain with sciatica, sciatica laterality unspecified, unspecified back pain laterality  Chronic pain syndrome  Acute rhinosinusitis - Plan: Novel Coronavirus, NAA ([facility])  Need for vaccination - Plan: Flu Vaccine QUAD 6+ mos PF IM (Fluarix Quad PF)         Review of Systems  Constitutional: Negative for activity change and appetite change.  HENT: Negative for  congestion and rhinorrhea.   Respiratory: Negative for cough and shortness of breath.   Cardiovascular: Negative for chest pain and leg swelling.  Gastrointestinal: Negative for abdominal pain, nausea and vomiting.  Skin: Negative for color change.  Neurological: Negative for dizziness and weakness.  Psychiatric/Behavioral: Negative for agitation and confusion.       Objective:   Physical Exam Vitals reviewed.  Constitutional:      General: She is not in acute distress. HENT:     Head: Normocephalic.  Cardiovascular:     Rate and Rhythm: Normal rate and regular rhythm.     Heart sounds: Normal heart sounds. No murmur heard.   Pulmonary:     Effort: Pulmonary effort is normal.     Breath sounds: Normal breath sounds.  Lymphadenopathy:     Cervical: No cervical adenopathy.  Neurological:     Mental Status: She is alert.  Psychiatric:        Behavior: Behavior normal.           Assessment & Plan:  1. Primary hypertension Blood pressure good control continue current medicine.  Minimize salt diet stay active.  Watch portions  2. Localized edema No significant swelling currently is taking her diuretic on a regular basis along with potassium recent lab work and look good she will be doing future lab work near future  3. Other hyperlipidemia Continue cholesterol medicine watch diet.  4. Chronic low back pain with sciatica, sciatica laterality  unspecified, unspecified back pain laterality Stretching exercises regular mild activity recommended.  Pain medicine when necessary  5. Chronic pain syndrome The patient was seen in followup for chronic pain. A review over at their current pain status was discussed. Drug registry was checked. Prescriptions were given.  Regular follow-up recommended. Discussion was held regarding the importance of compliance with medication as well as pain medication contract.  Patient was informed that medication may cause drowsiness and should  not be combined  with other medications/alcohol or street drugs. If the patient feels medication is causing altered alertness then do not drive or operate dangerous equipment.  Drug registry checked.  See above  6. Acute rhinosinusitis Mild sinus infection doxycycline twice daily 10 days warning signs discussed Covid test recommended - Novel Coronavirus, NAA ([facility])  7. Need for vaccination Flu shot today - Flu Vaccine QUAD 6+ mos PF IM (Fluarix Quad PF)

## 2020-06-25 LAB — NOVEL CORONAVIRUS, NAA: SARS-CoV-2, NAA: NOT DETECTED

## 2020-06-25 LAB — SARS-COV-2, NAA 2 DAY TAT

## 2020-07-21 ENCOUNTER — Encounter: Payer: Self-pay | Admitting: Family Medicine

## 2020-07-22 ENCOUNTER — Ambulatory Visit (INDEPENDENT_AMBULATORY_CARE_PROVIDER_SITE_OTHER): Payer: Medicaid Other | Admitting: Family Medicine

## 2020-07-22 ENCOUNTER — Other Ambulatory Visit: Payer: Self-pay

## 2020-07-22 ENCOUNTER — Telehealth: Payer: Self-pay

## 2020-07-22 ENCOUNTER — Encounter: Payer: Self-pay | Admitting: Family Medicine

## 2020-07-22 DIAGNOSIS — J019 Acute sinusitis, unspecified: Secondary | ICD-10-CM | POA: Diagnosis not present

## 2020-07-22 MED ORDER — AZITHROMYCIN 250 MG PO TABS
ORAL_TABLET | ORAL | 0 refills | Status: DC
Start: 2020-07-22 — End: 2020-08-26

## 2020-07-22 NOTE — Progress Notes (Signed)
   Subjective:    Patient ID: Pamela Mendez, female    DOB: 1966/08/21, 54 y.o.   MRN: [id number]  HPIsinus pressure. Mostly behind right eye, stuffy nose. Pt states never went away since taking doxy that was prescribed on 06/24/20.  Relates a lot of sinus pressure drainage coughing denies wheezing difficulty breathing had a negative Covid test never cleared up on the current antibiotics   Review of Systems Please see above    Objective:   Physical Exam  Moderate sinus tenderness lungs clear heart regular not toxic      Assessment & Plan:  Patient was seen today for upper respiratory illness. It is felt that the patient is dealing with sinusitis.  Antibiotics were prescribed today.  Compliance discussed.  If worsening symptoms or progressive illness notify us.  If emergency call 911 or go to ER. Covid test was not taken this was taken on her last visit just several days ago she does not feel anything is changed we will treat with azithromycin

## 2020-07-23 NOTE — Telephone Encounter (Signed)
Error

## 2020-07-28 ENCOUNTER — Telehealth: Payer: Self-pay

## 2020-07-28 MED ORDER — CLINDAMYCIN HCL 300 MG PO CAPS: 300.0000 mg | ORAL_CAPSULE | Freq: Three times a day (TID) | ORAL | 0 refills | Status: DC

## 2020-07-28 NOTE — Telephone Encounter (Signed)
The current Zithromax is still in her system.  It is there for 10 days.  If this has not helped her I would recommend clindamycin 300 mg 1 3 times daily for 7 days and if that does not help her I recommend ENT

## 2020-07-28 NOTE — Telephone Encounter (Signed)
Prescription sent electronically to pharmacy. Patient notified. 

## 2020-07-28 NOTE — Telephone Encounter (Signed)
Patient states she was seen 07/22/20 for a sinus infection and given z pack which has helped but has not gone completely away and would like more antibiotic - [facility]

## 2020-07-28 NOTE — Telephone Encounter (Signed)
Patient is requesting another round of antibiotic still having sinus pressure. [employer]

## 2020-08-05 ENCOUNTER — Encounter: Payer: Self-pay | Admitting: Family Medicine

## 2020-08-05 ENCOUNTER — Ambulatory Visit: Payer: Medicaid Other | Admitting: Family Medicine

## 2020-08-05 ENCOUNTER — Other Ambulatory Visit: Payer: Self-pay

## 2020-08-05 VITALS — BP 132/90 | HR 96 | Temp 97.3°F | Ht 66.0 in | Wt 221.0 lb

## 2020-08-05 DIAGNOSIS — M25561 Pain in right knee: Secondary | ICD-10-CM | POA: Diagnosis not present

## 2020-08-05 MED ORDER — NAPROXEN 500 MG PO TABS: 500.0000 mg | ORAL_TABLET | Freq: Two times a day (BID) | ORAL | 0 refills | Status: DC

## 2020-08-05 NOTE — Patient Instructions (Addendum)
Use RICE technique for next 2-3 weeks to see if your knee improves. Take Naproxen twice per day with food.  Follow-up in 3 weeks to see if need ortho referral.      RICE Therapy for Routine Care of Injuries Many injuries can be cared for with rest, ice, compression, and elevation (RICE therapy). This includes:  Resting the injured part.  Putting ice on the injury.  Putting pressure (compression) on the injury.  Raising the injured part (elevation). Using RICE therapy can help to lessen pain and swelling. Supplies needed:  Ice.  Plastic bag.  Towel.  Elastic bandage.  Pillow or pillows to raise (elevate) your injured body part. How to care for your injury with RICE therapy Rest Limit your normal activities, and try not to use the injured part of your body. You can go back to your normal activities when your doctor says it is okay to do them and you feel okay. Ask your doctor if you should do exercises to help your injury get better. Ice Put ice on the injured area. Do not put ice on your bare skin.  Put ice in a plastic bag.  Place a towel between your skin and the bag.  Leave the ice on for 20 minutes, 2-3 times a day. Use ice on as many days as told by your doctor.  Compression Compression means putting pressure on the injured area. This can be done with an elastic bandage. If an elastic bandage has been put on your injury:  Do not wrap the bandage too tight. Wrap the bandage more loosely if part of your body away from the bandage is blue, swollen, cold, painful, or loses feeling (gets numb).  Take off the bandage and put it on again. Do this every 3-4 hours or as told by your doctor.  See your doctor if the bandage seems to make your problems worse.  Elevation Elevation means keeping the injured area raised. If you can, raise the injured area above your heart or the center of your chest. Contact a doctor if:  You keep having pain and swelling.  Your symptoms  get worse. Get help right away if:  You have sudden bad pain at your injury or lower than your injury.  You have redness or more swelling around your injury.  You have tingling or numbness at your injury or lower than your injury, and it does not go away when you take off the bandage. Summary  Many injuries can be cared for using rest, ice, compression, and elevation (RICE therapy).  You can go back to your normal activities when you feel okay and your doctor says it is okay.  Put ice on the injured area as told by your doctor.  Get help if your symptoms get worse or if you keep having pain and swelling. This information is not intended to replace advice given to you by your health care provider. Make sure you discuss any questions you have with your health care provider. Document Revised: 05/11/2017 Document Reviewed: 05/11/2017 Elsevier Patient Education  2020 [employer].

## 2020-08-05 NOTE — Progress Notes (Signed)
Patient ID: Pamela Mendez, female    DOB: 1966/01/18, 54 y.o.   MRN: [id number]   Chief Complaint  Patient presents with  . Knee Pain   Subjective:  CC: right knee pain for 3-4 days  This is a new problem. Presents for c/o right knee pain and swelling. Symptoms started 3-4 days ago. Pain with bending and climbing stairs.   Denies injury/trauma. Swelling worse at end of day. Has tried ice and Biofreeze. History of right knee injury many years ago, has gotten steroid injections, but not in a long while. Takes Percocet for back pain.    right hip and right knee pain for several days. Tried biofreeze.    Medical History Pamela Mendez has a past medical history of Abdominal pain, Anxiety, Anxiety and depression, Cardiomyopathy (2004), CHF (congestive heart failure) (HCC) (2004), Chronic back pain, Chronic left hip pain, Depression, Gastritis, GERD (gastroesophageal reflux disease), H/O echocardiogram (2012), Hyperlipidemia, Hypertension, Migraine headache without aura, and Vertigo.   Outpatient Encounter Medications as of 08/05/2020  Medication Sig  . albuterol (VENTOLIN HFA) 108 (90 Base) MCG/ACT inhaler INHALE 2 PUFFS INTO THE LUNGS EVERY 6 HOURS AS NEEDED FOR WHEEZING.  Marland Kitchen amLODipine (NORVASC) 5 MG tablet Take 1 tablet (5 mg total) by mouth daily.  Marland Kitchen azelastine (ASTELIN) 0.1 % nasal spray Place 2 sprays into both nostrils 2 (two) times daily.  Marland Kitchen azithromycin (ZITHROMAX Z-PAK) 250 MG tablet Take 2 tablets (500 mg) on  Day 1,  followed by 1 tablet (250 mg) once daily on Days 2 through 5.  . cetirizine (ZYRTEC) 10 MG tablet TAKE ONE TABLET BY MOUTH AT BEDTIME AS NEEDED FOR ALLERGIES.  Marland Kitchen clindamycin (CLEOCIN) 300 MG capsule Take 1 capsule (300 mg total) by mouth 3 (three) times daily.  . fluticasone (FLONASE) 50 MCG/ACT nasal spray Place 2 sprays into both nostrils daily.  Marland Kitchen gabapentin (NEURONTIN) 300 MG capsule TAKE 2 CAPSULES BY MOUTH IN THE MORNING, 1 CAPSULE IN THE AFTERNOON AND 2 CAPSULES IN  THE EVENING  . lisinopril (ZESTRIL) 40 MG tablet TAKE 1 TABLET BY MOUTH DAILY.  . Multiple Vitamin (MULTIVITAMIN WITH MINERALS) TABS tablet Take 1 tablet by mouth daily.  . Olopatadine HCl 0.2 % SOLN Apply 1 drop to eye at bedtime as needed.  Marland Kitchen oxyCODONE-acetaminophen (PERCOCET/ROXICET) 5-325 MG tablet 1 pill 4 times daily as needed severe pain caution drowsiness  . oxyCODONE-acetaminophen (PERCOCET/ROXICET) 5-325 MG tablet TAKE (1) TABLET BY MOUTH (4) TIMES DAILY AS NEEDED.  Marland Kitchen oxyCODONE-acetaminophen (PERCOCET/ROXICET) 5-325 MG tablet 1 pill 4 times daily as needed severe pain caution drowsiness  . pantoprazole (PROTONIX) 40 MG tablet TAKE ONE TABLET BY MOUTH DAILY.  Marland Kitchen potassium chloride SA (KLOR-CON M20) 20 MEQ tablet Take 2 tablets qam 2 tablets at noon and 2 in the evening.  . rosuvastatin (CRESTOR) 20 MG tablet TAKE ONE TABLET BY MOUTH ONCE DAILY.  Marland Kitchen torsemide (DEMADEX) 20 MG tablet TAKE 3 TABLETS BY MOUTH EVERY MORNING AND 2 TABLETS AT NOON.  Marland Kitchen triamcinolone cream (KENALOG) 0.1 % Apply 1 application topically 2 (two) times daily. Prn rash; use up to 2 weeks  . naproxen (NAPROSYN) 500 MG tablet Take 1 tablet (500 mg total) by mouth 2 (two) times daily with a meal.   No facility-administered encounter medications on file as of 08/05/2020.     Review of Systems  Constitutional: Negative for chills and fever.  Respiratory: Negative for shortness of breath.   Cardiovascular: Negative for chest pain.  Gastrointestinal: Negative for abdominal  pain.  Musculoskeletal: Positive for joint swelling.       Right knee pain and swelling, worse at night.     Vitals BP 132/90   Pulse 96   Temp (!) 97.3 F (36.3 C)   Ht 5\' 6"  (1.676 m)   Wt 221 lb (100.2 kg)   SpO2 98%   BMI 35.67 kg/m   Objective:   Physical Exam Vitals and nursing note reviewed.  Constitutional:      General: She is not in acute distress.    Appearance: Normal appearance.  Cardiovascular:     Rate and Rhythm:  Normal rate and regular rhythm.  Pulmonary:     Effort: Pulmonary effort is normal.     Breath sounds: Normal breath sounds.  Musculoskeletal:        General: Swelling and tenderness present. No deformity or signs of injury.     Right lower leg: No edema.     Left lower leg: No edema.     Comments: Denies injury/trauma. Woke up 3-4 days ago with pain. Swelling worse at end of day. Has been getting down on floor with her pregnant dog frequently.   Skin:    General: Skin is warm and dry.  Neurological:     General: No focal deficit present.     Mental Status: She is alert and oriented to person, place, and time.  Psychiatric:        Mood and Affect: Mood normal.        Behavior: Behavior normal.        Thought Content: Thought content normal.        Judgment: Judgment normal.      Assessment and Plan   1. Acute pain of right knee - naproxen (NAPROSYN) 500 MG tablet; Take 1 tablet (500 mg total) by mouth 2 (two) times daily with a meal.  Dispense: 30 tablet; Refill: 0   Will try RICE for 3 weeks. Instructed to take Naproxen with food. Warning about NSAIDS and GI bleed. Will get compression sleeve today for support and will try to rest and ice as much as possible.  Agrees with plan of care discussed today. Understands warning signs to seek further care: chest pain, shortness of breath, any significant change in health. Understands to follow-up in 3 weeks, okay to cancel this appointment if knee pain has resolved. Will refer to ortho if conservative treatment and NSAIDS do not improve the pain. Has history of right knee injury many years ago, has not had trouble in a long while.      , FNP-C 08/05/2020

## 2020-08-26 ENCOUNTER — Ambulatory Visit: Payer: Medicaid Other | Admitting: Family Medicine

## 2020-08-26 ENCOUNTER — Other Ambulatory Visit: Payer: Self-pay

## 2020-08-26 ENCOUNTER — Encounter: Payer: Self-pay | Admitting: Family Medicine

## 2020-08-26 VITALS — BP 132/78 | HR 105 | Temp 97.1°F | Wt 216.8 lb

## 2020-08-26 DIAGNOSIS — M25561 Pain in right knee: Secondary | ICD-10-CM | POA: Diagnosis not present

## 2020-08-26 MED ORDER — MELOXICAM 15 MG PO TABS: 15.0000 mg | ORAL_TABLET | Freq: Every day | ORAL | 0 refills | Status: DC

## 2020-08-26 NOTE — Patient Instructions (Signed)
We will make a referral to ortho for possible injection    Meloxicam capsules What is this medicine? MELOXICAM (mel OX i cam) is a non-steroidal anti-inflammatory drug (NSAID). It is used to reduce swelling and to treat pain. It is used for osteoarthritis. This medicine may be used for other purposes; ask your health care provider or pharmacist if you have questions. COMMON BRAND NAME(S): Vivlodex What should I tell my health care provider before I take this medicine? They need to know if you have any of these conditions:  bleeding disorders  cigarette smoker  coronary artery bypass graft (CABG) surgery within the past 2 weeks  drink more than 3 alcohol-containing drinks per day  heart disease  high blood pressure  history of stomach bleeding  kidney disease  liver disease  lung or breathing disease, like asthma  stomach or intestine problems  an unusual or allergic reaction to meloxicam, aspirin, other NSAIDs, other medicines, foods, dyes, or preservatives  pregnant or trying to get pregnant  breast-feeding How should I use this medicine? Take this medicine by mouth with a full glass of water. Follow the directions on the prescription label. You can take it with or without food. If it upsets your stomach, take it with food. Take your medicine at regular intervals. Do not take it more often than directed. Do not stop taking except on your doctor's advice. A special MedGuide will be given to you by the pharmacist with each prescription and refill. Be sure to read this information carefully each time. Talk to your pediatrician regarding the use of this medicine in children. Special care may be needed. Patients over 86 years old may have a stronger reaction and need a smaller dose. Overdosage: If you think you have taken too much of this medicine contact a poison control center or emergency room at once. NOTE: This medicine is only for you. Do not share this medicine with  others. What if I miss a dose? If you miss a dose, take it as soon as you can. If it is almost time for your next dose, take only that dose. Do not take double or extra doses. What may interact with this medicine? Do not take this medicine with any of the following medications:  cidofovir  ketorolac This medicine may also interact with the following medications:  aspirin and aspirin-like medicines  certain medicines for blood pressure, heart disease, irregular heart beat  certain medicines for depression, anxiety, or psychotic disturbances  certain medicines that treat or prevent blood clots like warfarin, enoxaparin, dalteparin, apixaban, dabigatran, rivaroxaban  cyclosporine  diuretics  fluconazole  lithium  methotrexate  other NSAIDs, medicines for pain and inflammation, like ibuprofen and naproxen  pemetrexed This list may not describe all possible interactions. Give your health care provider a list of all the medicines, herbs, non-prescription drugs, or dietary supplements you use. Also tell them if you smoke, drink alcohol, or use illegal drugs. Some items may interact with your medicine. What should I watch for while using this medicine? Tell your doctor or healthcare provider if your symptoms do not start to get better or if they get worse. This medicine may cause serious skin reactions. They can happen weeks to months after starting the medicine. Contact your healthcare provider right away if you notice fevers or flu-like symptoms with a rash. The rash may be red or purple and then turn into blisters or peeling of the skin. Or, you might notice a red rash with swelling  of the face, lips or lymph nodes in your neck or under your arms. Do not take other medicines that contain aspirin, ibuprofen, or naproxen with this medicine. Side effects such as stomach upset, nausea, or ulcers may be more likely to occur. Many medicines available without a prescription should not be  taken with this medicine. This medicine can cause ulcers and bleeding in the stomach and intestines at any time during treatment. This can happen with no warning and may cause death. There is increased risk with taking this medicine for a long time. Smoking, drinking alcohol, older age, and poor health can also increase risks. Call your doctor right away if you have stomach pain or blood in your vomit or stool. This medicine does not prevent heart attack or stroke. In fact, this medicine may increase the chance of a heart attack or stroke. The chance may increase with longer use of this medicine and in people who have heart disease. If you take aspirin to prevent heart attack or stroke, talk with your doctor or healthcare provider. What side effects may I notice from receiving this medicine? Side effects that you should report to your doctor or health care professional as soon as possible:  allergic reactions like skin rash, itching or hives, swelling of the face, lips, or tongue  nausea, vomiting  redness, blistering, peeling, or loosening of the skin, including inside the mouth  signs and symptoms of a blood clot such as breathing problems; changes in vision; chest pain; severe, sudden headache; pain, swelling, warmth in the leg; trouble speaking; sudden numbness or weakness of the face, arm, or leg  signs and symptoms of bleeding such as bloody or black, tarry stools; red or dark-brown urine; spitting up blood or brown material that looks like coffee grounds; red spots on the skin; unusual bruising or bleeding from the eye, gums, or nose  signs and symptoms of liver injury like dark yellow or brown urine; general ill feeling or flu-like symptoms; light-colored stools; loss of appetite; nausea; right upper belly pain; unusually weak or tired; yellowing of the eyes or skin  signs and symptoms of stroke like changes in vision; confusion; trouble speaking or understanding; severe headaches; sudden  numbness or weakness of the face, arm, or leg; trouble walking; dizziness; loss of balance or coordination Side effects that usually do not require medical attention (report to your doctor or health care professional if they continue or are bothersome):  constipation  diarrhea  gas This list may not describe all possible side effects. Call your doctor for medical advice about side effects. You may report side effects to FDA at 1-800-FDA-1088. Where should I keep my medicine? Keep out of the reach of children. Store at room temperature between 15 and 30 degrees C (59 and 86 degrees F). Throw away any unused medicine after the expiration date. NOTE: This sheet is a summary. It may not cover all possible information. If you have questions about this medicine, talk to your doctor, pharmacist, or health care provider.  2020 Elsevier/Gold Standard (2018-11-20 11:19:03)
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
## 2020-09-06 ENCOUNTER — Other Ambulatory Visit: Payer: Self-pay | Admitting: Family Medicine

## 2020-09-07 DIAGNOSIS — E7849 Other hyperlipidemia: Secondary | ICD-10-CM | POA: Diagnosis not present

## 2020-09-07 DIAGNOSIS — R7303 Prediabetes: Secondary | ICD-10-CM | POA: Diagnosis not present

## 2020-09-07 DIAGNOSIS — I10 Essential (primary) hypertension: Secondary | ICD-10-CM | POA: Diagnosis not present

## 2020-09-07 DIAGNOSIS — Z79891 Long term (current) use of opiate analgesic: Secondary | ICD-10-CM | POA: Diagnosis not present

## 2020-09-08 ENCOUNTER — Ambulatory Visit: Payer: Medicaid Other | Admitting: Orthopaedic Surgery

## 2020-09-08 LAB — LIPID PANEL
Chol/HDL Ratio: 3.7 ratio (ref 0.0–4.4)
Cholesterol, Total: 160 mg/dL (ref 100–199)
HDL: 43 mg/dL (ref >39)
LDL Chol Calc (NIH): 79 mg/dL (ref 0–99)
Triglycerides: 229 mg/dL — ABNORMAL HIGH (ref 0–149)
VLDL Cholesterol Cal: 38 mg/dL (ref 5–40)

## 2020-09-08 LAB — HEMOGLOBIN A1C
Est. average glucose Bld gHb Est-mCnc: 163 mg/dL
Hgb A1c MFr Bld: 7.3 % — ABNORMAL HIGH (ref 4.8–5.6)

## 2020-09-08 LAB — HEPATIC FUNCTION PANEL
ALT: 15 IU/L (ref 0–32)
AST: 12 IU/L (ref 0–40)
Albumin: 3.9 g/dL (ref 3.8–4.9)
Alkaline Phosphatase: 77 IU/L (ref 44–121)
Bilirubin Total: 0.5 mg/dL (ref 0.0–1.2)
Bilirubin, Direct: 0.12 mg/dL (ref 0.00–0.40)
Total Protein: 6.9 g/dL (ref 6.0–8.5)

## 2020-09-08 LAB — BASIC METABOLIC PANEL
BUN/Creatinine Ratio: 9 (ref 9–23)
BUN: 10 mg/dL (ref 6–24)
CO2: 22 mmol/L (ref 20–29)
Calcium: 9.2 mg/dL (ref 8.7–10.2)
Chloride: 104 mmol/L (ref 96–106)
Creatinine, Ser: 1.11 mg/dL — ABNORMAL HIGH (ref 0.57–1.00)
GFR calc Af Amer: 66 mL/min/{1.73_m2} (ref >59)
GFR calc non Af Amer: 57 mL/min/{1.73_m2} — ABNORMAL LOW (ref >59)
Glucose: 242 mg/dL — ABNORMAL HIGH (ref 65–99)
Potassium: 3.8 mmol/L (ref 3.5–5.2)
Sodium: 141 mmol/L (ref 134–144)

## 2020-09-14 ENCOUNTER — Other Ambulatory Visit: Payer: Self-pay | Admitting: Family Medicine

## 2020-09-15 ENCOUNTER — Ambulatory Visit (INDEPENDENT_AMBULATORY_CARE_PROVIDER_SITE_OTHER): Payer: Medicaid Other | Admitting: Orthopaedic Surgery

## 2020-09-15 ENCOUNTER — Ambulatory Visit (INDEPENDENT_AMBULATORY_CARE_PROVIDER_SITE_OTHER): Payer: Medicaid Other

## 2020-09-15 ENCOUNTER — Encounter: Payer: Self-pay | Admitting: Orthopaedic Surgery

## 2020-09-15 ENCOUNTER — Other Ambulatory Visit: Payer: Self-pay

## 2020-09-15 VITALS — Ht 66.0 in | Wt 207.0 lb

## 2020-09-15 DIAGNOSIS — G8929 Other chronic pain: Secondary | ICD-10-CM

## 2020-09-15 DIAGNOSIS — M25561 Pain in right knee: Secondary | ICD-10-CM | POA: Diagnosis not present

## 2020-09-15 MED ORDER — BUPIVACAINE HCL 0.5 % IJ SOLN
2.0000 mL | INTRAMUSCULAR | Status: AC | PRN
  Administered 2020-09-15: 2 mL via INTRA_ARTICULAR

## 2020-09-15 MED ORDER — LIDOCAINE HCL 1 % IJ SOLN
2.0000 mL | INTRAMUSCULAR | Status: AC | PRN
  Administered 2020-09-15: 2 mL
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
## 2020-09-21 ENCOUNTER — Encounter: Payer: Self-pay | Admitting: Family Medicine

## 2020-09-23 ENCOUNTER — Encounter: Payer: Self-pay | Admitting: Family Medicine

## 2020-09-23 ENCOUNTER — Telehealth (INDEPENDENT_AMBULATORY_CARE_PROVIDER_SITE_OTHER): Payer: Medicaid Other | Admitting: Family Medicine

## 2020-09-23 ENCOUNTER — Other Ambulatory Visit: Payer: Self-pay | Admitting: *Deleted

## 2020-09-23 DIAGNOSIS — R7989 Other specified abnormal findings of blood chemistry: Secondary | ICD-10-CM | POA: Diagnosis not present

## 2020-09-23 DIAGNOSIS — I10 Essential (primary) hypertension: Secondary | ICD-10-CM

## 2020-09-23 DIAGNOSIS — E1169 Type 2 diabetes mellitus with other specified complication: Secondary | ICD-10-CM | POA: Diagnosis not present

## 2020-09-23 DIAGNOSIS — N289 Disorder of kidney and ureter, unspecified: Secondary | ICD-10-CM

## 2020-09-23 DIAGNOSIS — Z79891 Long term (current) use of opiate analgesic: Secondary | ICD-10-CM | POA: Diagnosis not present

## 2020-09-23 DIAGNOSIS — E119 Type 2 diabetes mellitus without complications: Secondary | ICD-10-CM

## 2020-09-23 DIAGNOSIS — E785 Hyperlipidemia, unspecified: Secondary | ICD-10-CM | POA: Insufficient documentation

## 2020-09-23 MED ORDER — OXYCODONE-ACETAMINOPHEN 5-325 MG PO TABS: ORAL_TABLET | ORAL | 0 refills | Status: DC

## 2020-09-23 MED ORDER — AMLODIPINE BESYLATE 5 MG PO TABS: 5.0000 mg | ORAL_TABLET | Freq: Every day | ORAL | 1 refills | Status: DC

## 2020-09-23 MED ORDER — POTASSIUM CHLORIDE CRYS ER 20 MEQ PO TBCR
EXTENDED_RELEASE_TABLET | ORAL | 5 refills | Status: DC
Start: 2020-09-23 — End: 2021-05-19

## 2020-09-23 MED ORDER — ROSUVASTATIN CALCIUM 20 MG PO TABS: 20.0000 mg | ORAL_TABLET | Freq: Every day | ORAL | 1 refills | Status: DC

## 2020-09-23 MED ORDER — METFORMIN HCL 500 MG PO TABS: 500.0000 mg | ORAL_TABLET | Freq: Two times a day (BID) | ORAL | 5 refills | Status: DC

## 2020-09-23 MED ORDER — GABAPENTIN 300 MG PO CAPS: ORAL_CAPSULE | ORAL | 5 refills | Status: DC

## 2020-09-23 MED ORDER — LISINOPRIL 40 MG PO TABS
40.0000 mg | ORAL_TABLET | Freq: Every day | ORAL | 5 refills | Status: DC
Start: 2020-09-23 — End: 2021-04-22

## 2020-09-23 NOTE — Patient Instructions (Signed)
Diabetes Mellitus and Nutrition, Adult When you have diabetes, or diabetes mellitus, it is very important to have healthy eating habits because your blood sugar (glucose) levels are greatly affected by what you eat and drink. Eating healthy foods in the right amounts, at about the same times every day, can help you:  Control your blood glucose.  Lower your risk of heart disease.  Improve your blood pressure.  Reach or maintain a healthy weight. What can affect my meal plan? Every person with diabetes is different, and each person has different needs for a meal plan. Your health care provider may recommend that you work with a dietitian to make a meal plan that is best for you. Your meal plan may vary depending on factors such as:  The calories you need.  The medicines you take.  Your weight.  Your blood glucose, blood pressure, and cholesterol levels.  Your activity level.  Other health conditions you have, such as heart or kidney disease. How do carbohydrates affect me? Carbohydrates, also called carbs, affect your blood glucose level more than any other type of food. Eating carbs naturally raises the amount of glucose in your blood. Carb counting is a method for keeping track of how many carbs you eat. Counting carbs is important to keep your blood glucose at a healthy level, especially if you use insulin or take certain oral diabetes medicines. It is important to know how many carbs you can safely have in each meal. This is different for every person. Your dietitian can help you calculate how many carbs you should have at each meal and for each snack. How does alcohol affect me? Alcohol can cause a sudden decrease in blood glucose (hypoglycemia), especially if you use insulin or take certain oral diabetes medicines. Hypoglycemia can be a life-threatening condition. Symptoms of hypoglycemia, such as sleepiness, dizziness, and confusion, are similar to symptoms of having too much  alcohol.  Do not drink alcohol if: ? Your health care provider tells you not to drink. ? You are pregnant, may be pregnant, or are planning to become pregnant.  If you drink alcohol: ? Do not drink on an empty stomach. ? Limit how much you use to:  0-1 drink a day for women.  0-2 drinks a day for men. ? Be aware of how much alcohol is in your drink. In the [place] one drink equals one 12 oz bottle of beer (355 mL), one 5 oz glass of wine (148 mL), or one 1 oz glass of hard liquor (44 mL). ? Keep yourself hydrated with water, diet soda, or unsweetened iced tea.  Keep in mind that regular soda, juice, and other mixers may contain a lot of sugar and must be counted as carbs. What are tips for following this plan? Reading food labels  Start by checking the serving size on the "Nutrition Facts" label of packaged foods and drinks. The amount of calories, carbs, fats, and other nutrients listed on the label is based on one serving of the item. Many items contain more than one serving per package.  Check the total grams (g) of carbs in one serving. You can calculate the number of servings of carbs in one serving by dividing the total carbs by 15. For example, if a food has 30 g of total carbs per serving, it would be equal to 2 servings of carbs.  Check the number of grams (g) of saturated fats and trans fats in one serving. Choose foods that have   a low amount or none of these fats.  Check the number of milligrams (mg) of salt (sodium) in one serving. Most people should limit total sodium intake to less than 2,300 mg per day.  Always check the nutrition information of foods labeled as "low-fat" or "nonfat." These foods may be higher in added sugar or refined carbs and should be avoided.  Talk to your dietitian to identify your daily goals for nutrients listed on the label. Shopping  Avoid buying canned, pre-made, or processed foods. These foods tend to be high in fat, sodium, and added  sugar.  Shop around the outside edge of the grocery store. This is where you will most often find fresh fruits and vegetables, bulk grains, fresh meats, and fresh dairy. Cooking  Use low-heat cooking methods, such as baking, instead of high-heat cooking methods like deep frying.  Cook using healthy oils, such as olive, canola, or sunflower oil.  Avoid cooking with butter, cream, or high-fat meats. Meal planning  Eat meals and snacks regularly, preferably at the same times every day. Avoid going long periods of time without eating.  Eat foods that are high in fiber, such as fresh fruits, vegetables, beans, and whole grains. Talk with your dietitian about how many servings of carbs you can eat at each meal.  Eat 4-6 oz (112-168 g) of lean protein each day, such as lean meat, chicken, fish, eggs, or tofu. One ounce (oz) of lean protein is equal to: ? 1 oz (28 g) of meat, chicken, or fish. ? 1 egg. ?  cup (62 g) of tofu.  Eat some foods each day that contain healthy fats, such as avocado, nuts, seeds, and fish.   What foods should I eat? Fruits Berries. Apples. Oranges. Peaches. Apricots. Plums. Grapes. Mango. Papaya. Pomegranate. Kiwi. Cherries. Vegetables Lettuce. Spinach. Leafy greens, including kale, chard, collard greens, and mustard greens. Beets. Cauliflower. Cabbage. Broccoli. Carrots. Green beans. Tomatoes. Peppers. Onions. Cucumbers. Brussels sprouts. Grains Whole grains, such as whole-wheat or whole-grain bread, crackers, tortillas, cereal, and pasta. Unsweetened oatmeal. Quinoa. Brown or wild rice. Meats and other proteins Seafood. Poultry without skin. Lean cuts of poultry and beef. Tofu. Nuts. Seeds. Dairy Low-fat or fat-free dairy products such as milk, yogurt, and cheese. The items listed above may not be a complete list of foods and beverages you can eat. Contact a dietitian for more information. What foods should I avoid? Fruits Fruits canned with  syrup. Vegetables Canned vegetables. Frozen vegetables with butter or cream sauce. Grains Refined white flour and flour products such as bread, pasta, snack foods, and cereals. Avoid all processed foods. Meats and other proteins Fatty cuts of meat. Poultry with skin. Breaded or fried meats. Processed meat. Avoid saturated fats. Dairy Full-fat yogurt, cheese, or milk. Beverages Sweetened drinks, such as soda or iced tea. The items listed above may not be a complete list of foods and beverages you should avoid. Contact a dietitian for more information. Questions to ask a health care provider  Do I need to meet with a diabetes educator?  Do I need to meet with a dietitian?  What number can I call if I have questions?  When are the best times to check my blood glucose? Where to find more information:  American Diabetes Association: diabetes.org  Academy of Nutrition and Dietetics: www.eatright.org  National Institute of Diabetes and Digestive and Kidney Diseases: www.niddk.nih.gov  Association of Diabetes Care and Education Specialists: www.diabeteseducator.org Summary  It is important to have healthy eating   habits because your blood sugar (glucose) levels are greatly affected by what you eat and drink.  A healthy meal plan will help you control your blood glucose and maintain a healthy lifestyle.  Your health care provider may recommend that you work with a dietitian to make a meal plan that is best for you.  Keep in mind that carbohydrates (carbs) and alcohol have immediate effects on your blood glucose levels. It is important to count carbs and to use alcohol carefully. This information is not intended to replace advice given to you by your health care provider. Make sure you discuss any questions you have with your health care provider. Document Revised: 07/29/2019 Document Reviewed: 07/29/2019 Elsevier Patient Education  2021 Elsevier Inc.  

## 2020-09-23 NOTE — Progress Notes (Signed)
Subjective:    Patient ID: Pamela Mendez, female    DOB: 09-06-1965, 55 y.o.   MRN: [id number]  HPI  We reviewed over her recent lab work. A1c did go up We talked about how her LDL slightly elevated as well.  Creatinine is gone up some.  This will need to be monitored.  Patient is going to work hard at trying to eat healthier. She is also can work on increasing activity when the weather is better   Virtual Visit via Telephone Note  I connected with Pamela Mendez on 09/23/20 at  8:40 AM EST by telephone and verified that I am speaking with the correct person using two identifiers.  Location: Patient: home Provider: home   I discussed the limitations, risks, security and privacy concerns of performing an evaluation and management service by telephone and the availability of in person appointments. I also discussed with the patient that there may be a patient responsible charge related to this service. The patient expressed understanding and agreed to proceed.   History of Present Illness:    Observations/Objective:   Assessment and Plan:   Follow Up Instructions:    I discussed the assessment and treatment plan with the patient. The patient was provided an opportunity to ask questions and all were answered. The patient agreed with the plan and demonstrated an understanding of the instructions.   The patient was advised to call back or seek an in-person evaluation if the symptoms worsen or if the condition fails to improve as anticipated.  I provided 25 including documentation chart review review of labs and discussion with patient minutes of non-face-to-face time during this encounter.  This patient was seen today for chronic pain  The medication list was reviewed and updated.   -Compliance with medication: yes  - Number patient states they take daily: 4  -when was the last dose patient took? Last night  The patient was advised the importance of maintaining  medication and not using illegal substances with these.  Here for refills and follow up  The patient was educated that we can provide 3 monthly scripts for their medication, it is their responsibility to follow the instructions.  Side effects or complications from medications: none  Patient is aware that pain medications are meant to minimize the severity of the pain to allow their pain levels to improve to allow for better function. They are aware of that pain medications cannot totally remove their pain.  Due for UDT ( at least once per year) : 03/24/21  Scale of 1 to 10 ( 1 is least 10 is most) Your pain level without the medicine: 8 Your pain level with medication 4  Scale 1 to 10 ( 1-helps very little, 10 helps very well) How well does your pain medication reduce your pain so you can function better through out the day? 7       Review of Systems  Constitutional: Negative for activity change, appetite change and fatigue.  HENT: Negative for congestion.   Respiratory: Negative for cough.   Cardiovascular: Negative for chest pain.  Gastrointestinal: Negative for abdominal pain.  Musculoskeletal: Positive for arthralgias and back pain.  Skin: Negative for color change.  Neurological: Negative for headaches.  Psychiatric/Behavioral: Negative for behavioral problems.       Objective:   Physical Exam  Today's visit was via telephone Physical exam was not possible for this visit       Assessment & Plan:  1. Diabetes  mellitus without complication (HCC) Her A1c is 7.3 Dietary measures were discussed regular physical activity discussed as well Follow-up if progressive troubles Repeat A1c in 3 months Work hard on diet Start metformin 500 mg twice daily Starting off half tablet twice daily If significant side effects to let us know  2. Hyperlipidemia associated with type 2 diabetes mellitus (HCC) Cholesterol does not look bad but LDL could be better she will work hard  on diet continue medication  3. Encounter for long-term opiate analgesic use The patient was seen in followup for chronic pain. A review over at their current pain status was discussed. Drug registry was checked. Prescriptions were given.  Regular follow-up recommended. Discussion was held regarding the importance of compliance with medication as well as pain medication contract.  Patient was informed that medication may cause drowsiness and should not be combined  with other medications/alcohol or street drugs. If the patient feels medication is causing altered alertness then do not drive or operate dangerous equipment.  Drug registry checked 3 scripts will be sent in  Creatinine slightly elevated will monitor this closely certainly if it is going up this is a sign of chronic kidney disease in the face of diabetes. Patient was encouraged to eat healthy stay active keep blood pressure under control can keep A1c under control  Follow-up in 3 months

## 2020-09-24 ENCOUNTER — Ambulatory Visit: Payer: Medicaid Other | Admitting: Family Medicine

## 2020-10-12 ENCOUNTER — Other Ambulatory Visit: Payer: Self-pay | Admitting: Family Medicine

## 2020-11-16 ENCOUNTER — Encounter: Payer: Self-pay | Admitting: Family Medicine

## 2020-11-16 ENCOUNTER — Ambulatory Visit: Payer: Medicaid Other | Admitting: Family Medicine

## 2020-11-16 ENCOUNTER — Other Ambulatory Visit: Payer: Self-pay

## 2020-11-16 VITALS — BP 116/72 | HR 68 | Temp 97.3°F | Ht 66.0 in | Wt 215.2 lb

## 2020-11-16 DIAGNOSIS — G8929 Other chronic pain: Secondary | ICD-10-CM | POA: Diagnosis not present

## 2020-11-16 DIAGNOSIS — M5431 Sciatica, right side: Secondary | ICD-10-CM | POA: Diagnosis not present

## 2020-11-16 DIAGNOSIS — M5440 Lumbago with sciatica, unspecified side: Secondary | ICD-10-CM

## 2020-11-16 MED ORDER — AZITHROMYCIN 250 MG PO TABS
ORAL_TABLET | ORAL | 0 refills | Status: DC
Start: 2020-11-16 — End: 2021-02-16

## 2020-11-16 NOTE — Progress Notes (Addendum)
Subjective:    Patient ID: Pamela Mendez, female    DOB: 28-Dec-1965, 55 y.o.   MRN: [id number]  HPI This patient was seen today for chronic pain  The medication list was reviewed and updated.   -Compliance with medication: yes  - Number patient states they take daily: 4 a day  -when was the last dose patient took? This am  The patient was advised the importance of maintaining medication and not using illegal substances with these.  Here for refills and follow up  The patient was educated that we can provide 3 monthly scripts for their medication, it is their responsibility to follow the instructions.  Side effects or complications from medications: none  Patient is aware that pain medications are meant to minimize the severity of the pain to allow their pain levels to improve to allow for better function. They are aware of that pain medications cannot totally remove their pain.  Due for UDT ( at least once per year) : last one 03/24/20  Scale of 1 to 10 ( 1 is least 10 is most) Your pain level without the medicine: 9-10 Your pain level with medication 4-5  Scale 1 to 10 ( 1-helps very little, 10 helps very well) How well does your pain medication reduce your pain so you can function better through out the day? 4-5  This patient has had chronic pain for many years.  Had original back surgery in the early 2000's.  Had a repeat back surgery in 2010 with fusion.  Through the years she has tried Tylenol Advil Aleve other anti-inflammatories.  She is also tried home exercises and stretches.  In addition to this she has done physical therapy.  She is also done injections.  None of these have alleviated her pain.  She has persistent pain and discomfort that interferes with her quality of life.  She has tried gabapentin as and adjunctive medicine but it does not do enough to alleviate her pain She was on hydrocodone and gradually had dosage increased until it was no longer effective she  has been on hydrocodone for greater than 10 years.  2 years ago patient was changed over to oxycodone and currently takes 5 mg oxycodone maximum 4 times daily.   Patient does not have a history of depression.  No history of drug abuse.  Does not use illicit drugs.  Does not have family history of abuse or drug addiction.  Does not use alcohol.  Review of Systems She complains of back pain radiates down into the hip down into the leg.  Patient relates the pain is severe enough to where it makes her unstable with walking.  In addition to this having to use a cane to get around.    Objective:   Physical Exam Lungs are clear respiratory rate normal heart regular positive straight leg raise on the right negative on the left strength is overall good extremities no edema skin warm dry neurologic grossly normal.  Patient has limited range of motion difficult time getting onto and off the exam table.       Assessment & Plan:  1. Chronic low back pain with sciatica, sciatica laterality unspecified, unspecified back pain laterality Her chronic pain medicine was denied by her insurance Patient had thorough work-up of her back pain over the past 20+ years. She has gone through physical therapy She has tried injections She has had 2 surgeries with effusion She has severe chronic low back pain with sciatica  Injections did not help enough Surgeries alleviated some but did not take away the severity of her pain Anti-inflammatories do not adequately alleviate her pain Tylenol does not adequately alleviate her pain She has been on opioid pain medicine for many years has been on a stable dosing and responsibly takes it Drug registry is checked on a regular basis Urine drug screens are done on a yearly basis Patient has signed a pain management agreement on a yearly basis and follows with regular visits over the past 10+ years She is not abusing the medicine She responsibly takes it Patient has been  counseled if she is interested in tapering off of the pain medicine on how to do this.  Patient has been offered to see pain management as well as other specialist but is not felt that this is necessary at this point. Thorough evaluation was done today please see forms please see dictation Refusal of insurance company to approve this medical board approved use of pain medicine is unfortunately detrimental to the patient and the patient's wellbeing.  2. Sciatica of right side Significant sciatica See discussion above I do not feel the patient would benefit from further MRI.  I do not feel she would benefit from further injections currently. I do not feel she would benefit from surgery.   CDC guidelines have been followed [facility] medical board guidelines for opiates have been followed Patient is monitored on a regular basis for her chronic pain.

## 2020-11-17 NOTE — Progress Notes (Signed)
PA sent to insurance through [employer]. Await decision.

## 2020-11-18 NOTE — Progress Notes (Signed)
PA denied; sent recent pain management visit notes to insurance. Await decision.

## 2020-11-21 ENCOUNTER — Telehealth: Payer: Self-pay | Admitting: Family Medicine

## 2020-11-21 NOTE — Telephone Encounter (Signed)
Nurses Hopefully her pain medicine will be approved I am returning her recent denial from 11/05/2020 Please make sure that if her medicine is denied again that the patient is informed how she can appeal the decision.  And if she does appeal the decision I would recommend she submit the documentation from 11/16/2020  FYI- If she goes through an appeal that she does and it also is denied the next step would be referral to pain management

## 2020-11-25 ENCOUNTER — Other Ambulatory Visit: Payer: Self-pay | Admitting: Family Medicine

## 2020-11-25 NOTE — Telephone Encounter (Signed)
Pt signed form and I faxed it in today.

## 2020-11-25 NOTE — Telephone Encounter (Signed)
[employer] called concerning appeal and stated the patient would need to sign a consent for [place] to file the appeal on her behalf- this would need to be signed by patient and refaxed to [phone] by the end of day today for the appeal to be processed

## 2020-11-29 NOTE — Telephone Encounter (Signed)
Letter from insurance med was approved. Pt and pharm notified.

## 2020-11-29 NOTE — Telephone Encounter (Signed)
Thank you, good work to all

## 2020-12-04 ENCOUNTER — Other Ambulatory Visit: Payer: Self-pay | Admitting: Family Medicine

## 2020-12-05 ENCOUNTER — Encounter: Payer: Self-pay | Admitting: Family Medicine

## 2020-12-05 DIAGNOSIS — Z713 Dietary counseling and surveillance: Secondary | ICD-10-CM

## 2020-12-06 NOTE — Telephone Encounter (Signed)
12 rf

## 2020-12-07 NOTE — Addendum Note (Signed)
Addended by: Marlowe Shores on: 12/07/2020 01:27 PM   Modules accepted: Orders

## 2020-12-07 NOTE — Telephone Encounter (Signed)
Nurses Please help set her up with [facility].  If she would like to go ahead with the referral that would be fine.  Typically they do a introductory discussion first before any type of in person visit.

## 2020-12-08 ENCOUNTER — Telehealth: Payer: Self-pay | Admitting: Family Medicine

## 2021-01-03 ENCOUNTER — Other Ambulatory Visit: Payer: Self-pay | Admitting: Family Medicine

## 2021-01-03 ENCOUNTER — Telehealth: Payer: Self-pay | Admitting: Family Medicine

## 2021-01-03 NOTE — Telephone Encounter (Signed)
Patient is requesting refill on oxycodone 5/325 called into [facility] she was last seen 11/16/20 for medication followup her next follow up is 6/15. Please advise

## 2021-01-03 NOTE — Telephone Encounter (Signed)
Rx request also sent. Please advise. Thank you

## 2021-01-04 ENCOUNTER — Other Ambulatory Visit: Payer: Self-pay | Admitting: Family Medicine

## 2021-01-04 ENCOUNTER — Encounter: Payer: Self-pay | Admitting: Family Medicine

## 2021-01-04 MED ORDER — OXYCODONE-ACETAMINOPHEN 5-325 MG PO TABS: ORAL_TABLET | ORAL | 0 refills | Status: DC

## 2021-01-04 NOTE — Telephone Encounter (Signed)
Prescription medications were sent in keep follow-up visit

## 2021-01-04 NOTE — Telephone Encounter (Signed)
Pt sent my chart message and is aware that refills were sent in .

## 2021-02-16 ENCOUNTER — Other Ambulatory Visit: Payer: Self-pay

## 2021-02-16 ENCOUNTER — Ambulatory Visit: Payer: Medicaid Other | Admitting: Family Medicine

## 2021-02-16 ENCOUNTER — Encounter: Payer: Self-pay | Admitting: Family Medicine

## 2021-02-16 VITALS — BP 126/84 | HR 89 | Temp 97.0°F | Wt 210.2 lb

## 2021-02-16 DIAGNOSIS — E119 Type 2 diabetes mellitus without complications: Secondary | ICD-10-CM | POA: Diagnosis not present

## 2021-02-16 DIAGNOSIS — J301 Allergic rhinitis due to pollen: Secondary | ICD-10-CM | POA: Diagnosis not present

## 2021-02-16 DIAGNOSIS — E785 Hyperlipidemia, unspecified: Secondary | ICD-10-CM | POA: Diagnosis not present

## 2021-02-16 DIAGNOSIS — I10 Essential (primary) hypertension: Secondary | ICD-10-CM | POA: Diagnosis not present

## 2021-02-16 DIAGNOSIS — E1169 Type 2 diabetes mellitus with other specified complication: Secondary | ICD-10-CM | POA: Diagnosis not present

## 2021-02-16 MED ORDER — OXYCODONE-ACETAMINOPHEN 5-325 MG PO TABS: ORAL_TABLET | ORAL | 0 refills | Status: DC

## 2021-02-16 MED ORDER — MONTELUKAST SODIUM 10 MG PO TABS: 10.0000 mg | ORAL_TABLET | Freq: Every day | ORAL | 3 refills | Status: DC

## 2021-02-16 NOTE — Progress Notes (Signed)
Subjective:    Patient ID: Pamela Mendez, female    DOB: 01-May-1966, 55 y.o.   MRN: [id number]  HPI This patient was seen today for chronic pain  The medication list was reviewed and updated.  Location of Pain for which the patient has been treated with regarding narcotics:   Onset of this pain:    -Compliance with medication: Oxycodone 5-325 mg  - Number patient states they take daily: 4  -when was the last dose patient took? Last night around 8:30 (does not take if driving)  The patient was advised the importance of maintaining medication and not using illegal substances with these.  Here for refills and follow up  The patient was educated that we can provide 3 monthly scripts for their medication, it is their responsibility to follow the instructions.  Side effects or complications from medications: none  Patient is aware that pain medications are meant to minimize the severity of the pain to allow their pain levels to improve to allow for better function. They are aware of that pain medications cannot totally remove their pain.  Due for UDT ( at least once per year) : completed July 2021  Scale of 1 to 10 ( 1 is least 10 is most) Your pain level without the medicine: 9 Your pain level with medication: 3  Scale 1 to 10 ( 1-helps very little, 10 helps very well) How well does your pain medication reduce your pain so you can function better through out the day? 10  Quality of the pain: Burning throbbing pain mainly in the back into the legs  Persistence of the pain: Present 24/7 worse on some days  Modifying factors: Worse with activity  Primary hypertension - Plan: Comprehensive metabolic panel  Non-seasonal allergic rhinitis due to pollen  Hyperlipidemia associated with type 2 diabetes mellitus (HCC) - Plan: Lipid panel  Diabetes mellitus without complication (HCC) - Plan: Hemoglobin A1c Patient does try to watch her diet tries take her metformin on a regular  basis denies any high sugars currently Takes her blood pressure medicine regular basis minimize the salt in the diet stays active Does take her allergy medicine but does need something in addition to Zyrtec and Flonase because of her symptoms Patient here for follow-up regarding cholesterol.  The patient does have hyperlipidemia.  Patient does try to maintain a reasonable diet.  Patient does take the medication on a regular basis.  Denies missing a dose.  The patient denies any obvious side effects.  Prior blood work results reviewed with the patient.  The patient is aware of his cholesterol goals and the need to keep it under good control to lessen the risk of disease.       Review of Systems     Objective:   Physical Exam  General-in no acute distress Eyes-no discharge Lungs-respiratory rate normal, CTA CV-no murmurs,RRR Extremities skin warm dry no edema Neuro grossly normal Beha increased vior normal, alert Increased pain with straight leg raise     Assessment & Plan:  Urine drug screen on next visit  1. Primary hypertension Blood pressure decent control continue current measures watch diet closely - Comprehensive metabolic panel  2. Non-seasonal allergic rhinitis due to pollen Add Singulair every night during the peak season to help continue Zyrtec as well as Flonase  3. Hyperlipidemia associated with type 2 diabetes mellitus (HCC) Watch diet closely check lipid profile watch diet closely watch portions stay active check A1c as well - Lipid  panel  4. Diabetes mellitus without complication (HCC) A1c will be checked await results - Hemoglobin A1c  The patient was seen in followup for chronic pain. A review over at their current pain status was discussed. Drug registry was checked. Prescriptions were given.  Regular follow-up recommended. Discussion was held regarding the importance of compliance with medication as well as pain medication contract.  Patient was  informed that medication may cause drowsiness and should not be combined  with other medications/alcohol or street drugs. If the patient feels medication is causing altered alertness then do not drive or operate dangerous equipment. Drug registry checked 3 prescriptions given  Follow-up 3 months

## 2021-02-17 LAB — COMPREHENSIVE METABOLIC PANEL
ALT: 26 IU/L (ref 0–32)
AST: 19 IU/L (ref 0–40)
Albumin/Globulin Ratio: 1.5 (ref 1.2–2.2)
Albumin: 4.6 g/dL (ref 3.8–4.9)
Alkaline Phosphatase: 71 IU/L (ref 44–121)
BUN/Creatinine Ratio: 13 (ref 9–23)
BUN: 10 mg/dL (ref 6–24)
Bilirubin Total: 0.5 mg/dL (ref 0.0–1.2)
CO2: 23 mmol/L (ref 20–29)
Calcium: 9.7 mg/dL (ref 8.7–10.2)
Chloride: 107 mmol/L — ABNORMAL HIGH (ref 96–106)
Creatinine, Ser: 0.79 mg/dL (ref 0.57–1.00)
Globulin, Total: 3 g/dL (ref 1.5–4.5)
Glucose: 75 mg/dL (ref 65–99)
Potassium: 4.4 mmol/L (ref 3.5–5.2)
Sodium: 147 mmol/L — ABNORMAL HIGH (ref 134–144)
Total Protein: 7.6 g/dL (ref 6.0–8.5)
eGFR: 89 mL/min/{1.73_m2} (ref >59)

## 2021-02-17 LAB — LIPID PANEL
Chol/HDL Ratio: 3 ratio (ref 0.0–4.4)
Cholesterol, Total: 163 mg/dL (ref 100–199)
HDL: 54 mg/dL (ref >39)
LDL Chol Calc (NIH): 81 mg/dL (ref 0–99)
Triglycerides: 166 mg/dL — ABNORMAL HIGH (ref 0–149)
VLDL Cholesterol Cal: 28 mg/dL (ref 5–40)

## 2021-02-17 LAB — HEMOGLOBIN A1C
Est. average glucose Bld gHb Est-mCnc: 154 mg/dL
Hgb A1c MFr Bld: 7 % — ABNORMAL HIGH (ref 4.8–5.6)

## 2021-03-01 DIAGNOSIS — K219 Gastro-esophageal reflux disease without esophagitis: Secondary | ICD-10-CM | POA: Diagnosis not present

## 2021-03-01 DIAGNOSIS — I509 Heart failure, unspecified: Secondary | ICD-10-CM | POA: Insufficient documentation

## 2021-03-01 DIAGNOSIS — R7303 Prediabetes: Secondary | ICD-10-CM | POA: Diagnosis not present

## 2021-03-01 DIAGNOSIS — Z981 Arthrodesis status: Secondary | ICD-10-CM | POA: Diagnosis not present

## 2021-03-01 DIAGNOSIS — I10 Essential (primary) hypertension: Secondary | ICD-10-CM | POA: Diagnosis not present

## 2021-03-01 DIAGNOSIS — E782 Mixed hyperlipidemia: Secondary | ICD-10-CM | POA: Diagnosis not present

## 2021-03-02 ENCOUNTER — Telehealth: Payer: Self-pay | Admitting: Family Medicine

## 2021-03-02 ENCOUNTER — Encounter: Payer: Self-pay | Admitting: Family Medicine

## 2021-03-02 NOTE — Telephone Encounter (Signed)
Typically blood clots cause swelling in the legs with pain and discomfort If there are knots in the legs but does not seem to be a blood clot issue follow-up office visit this week If patient is thoroughly convinced she has blood clots I do not have any appointments for today or tomorrow I would recommend an ER evaluation Otherwise office visit with myself Dr. Ladona Ridgel or Eber Jones this week if possible

## 2021-03-02 NOTE — Telephone Encounter (Signed)
Pt has been walking the walking trail with her mom and knots have been coming up on both legs, states that it only has happened while she has been walking.  Blood clots run in her family. Pt would like a phone call back regarding this.

## 2021-03-02 NOTE — Telephone Encounter (Signed)
Left message to return call 

## 2021-03-03 NOTE — Telephone Encounter (Signed)
Pt sent my chart message; copy and pasted message to my chart for patient.

## 2021-03-09 ENCOUNTER — Telehealth: Payer: Self-pay | Admitting: Family Medicine

## 2021-03-09 ENCOUNTER — Other Ambulatory Visit: Payer: Self-pay | Admitting: Family Medicine

## 2021-03-09 ENCOUNTER — Other Ambulatory Visit: Payer: Self-pay | Admitting: *Deleted

## 2021-03-09 ENCOUNTER — Encounter: Payer: Self-pay | Admitting: Family Medicine

## 2021-03-09 MED ORDER — ALBUTEROL SULFATE HFA 108 (90 BASE) MCG/ACT IN AERS: INHALATION_SPRAY | RESPIRATORY_TRACT | 3 refills | Status: DC

## 2021-03-09 NOTE — Telephone Encounter (Signed)
Patient is requesting refill on albuterol inhaler she is completely out and needing it as soon as possible. [employer]

## 2021-03-09 NOTE — Telephone Encounter (Signed)
Med sent to pharm and pt was notified.  °

## 2021-03-09 NOTE — Telephone Encounter (Signed)
See myhcart message. I called and got more info on her mychart message

## 2021-03-09 NOTE — Telephone Encounter (Signed)
I called and discussed with pt. And med sent to pharm.

## 2021-03-09 NOTE — Telephone Encounter (Signed)
Albuterol 2 puffs every 4 hours as needed, 1 inhaler, 3 refills, follow-up if problems

## 2021-03-09 NOTE — Telephone Encounter (Signed)
Called pt because albuterol inhaler on med list was last sent in back in 2020. States when she was out walking in heat today she needed inhaler. States she used the one from 2020 off and on but now out.  [place] [facility]

## 2021-03-09 NOTE — Telephone Encounter (Signed)
I recommend albuterol 2 puffs every 4 as needed, 1 inhaler with 3 refills  If any ongoing troubles please follow-up

## 2021-03-22 ENCOUNTER — Encounter: Payer: Self-pay | Admitting: Family Medicine

## 2021-03-23 ENCOUNTER — Other Ambulatory Visit: Payer: Self-pay

## 2021-03-23 ENCOUNTER — Ambulatory Visit: Payer: Medicaid Other | Admitting: Nurse Practitioner

## 2021-03-23 VITALS — BP 103/71 | HR 113 | Temp 98.1°F | Ht 66.0 in | Wt 201.0 lb

## 2021-03-23 DIAGNOSIS — I10 Essential (primary) hypertension: Secondary | ICD-10-CM | POA: Diagnosis not present

## 2021-03-23 NOTE — Patient Instructions (Signed)
Decrease Lisinopril to 1/2 of the 40 mg (20 mg) Monitor BP and let us know readings

## 2021-03-23 NOTE — Progress Notes (Signed)
   Subjective:    Patient ID: Pamela Mendez, female    DOB: 03-04-1966, 55 y.o.   MRN: [id number]  Hypertension Treatments tried: amlodipine, lisinopril.  Patient has been dieting, exercising, and has lost significant weight. BP readings at home have been  ex: 98/53 Patient has made some major lifestyle changes including regular exercise including walking on a regular basis.  Weight loss.  Has cut out diet sodas for the past 3 weeks mainly drinking water.  States she went to a weight loss program but decided instead of surgery she was going to do this on her own.  Blood pressures have been running very low at home, as low as 80/50.  Denies any severe dizziness or syncopal episodes.  No chest pain/ischemic type pain or unusual shortness of breath.         Objective:   Physical Exam NAD.  Alert, oriented.  Cheerful calm affect.  Lungs clear.  Heart regular rate rhythm.  Lower extremities no edema. Today's Vitals   03/23/21 1408  BP: 103/71  Pulse: (!) 113  Temp: 98.1 F (36.7 C)  SpO2: 95%  Weight: 201 lb (91.2 kg)  Height: 5\' 6"  (1.676 m)   Body mass index is 32.44 kg/m. Weight loss of 14 pounds since March.       Assessment & Plan:   Problem List Items Addressed This Visit       Cardiovascular and Mediastinum   HTN (hypertension) - Primary  Continue healthy lifestyle changes and weight loss efforts. Note the patient has cardiomyopathy in addition to hypertension. Due to the improvement in her blood pressure as well as risk of orthostatic hypotension, patient instructed to decrease her lisinopril dose from 40 mg to half a tab (20 mg).  Continue monitoring blood pressure and contact the office if it remains very low. Routine follow-up with Dr. April as planned, call back sooner if any problems.

## 2021-03-24 ENCOUNTER — Encounter: Payer: Self-pay | Admitting: Nurse Practitioner

## 2021-04-04 ENCOUNTER — Other Ambulatory Visit: Payer: Self-pay | Admitting: Family Medicine

## 2021-04-06 ENCOUNTER — Other Ambulatory Visit: Payer: Self-pay | Admitting: Family Medicine

## 2021-04-07 DIAGNOSIS — E119 Type 2 diabetes mellitus without complications: Secondary | ICD-10-CM

## 2021-04-07 DIAGNOSIS — E876 Hypokalemia: Secondary | ICD-10-CM

## 2021-04-07 DIAGNOSIS — I10 Essential (primary) hypertension: Secondary | ICD-10-CM

## 2021-04-07 NOTE — Telephone Encounter (Signed)
Nurses-May check metabolic 7 and A1c  Hypertension diabetes hypokalemia are the diagnosis

## 2021-04-07 NOTE — Addendum Note (Signed)
Addended by: Marlowe Shores on: 04/07/2021 10:17 AM   Modules accepted: Orders

## 2021-04-12 ENCOUNTER — Other Ambulatory Visit: Payer: Self-pay | Admitting: Family Medicine

## 2021-04-22 ENCOUNTER — Other Ambulatory Visit: Payer: Self-pay

## 2021-04-22 ENCOUNTER — Ambulatory Visit (INDEPENDENT_AMBULATORY_CARE_PROVIDER_SITE_OTHER): Payer: Medicaid Other | Admitting: Family Medicine

## 2021-04-22 VITALS — BP 101/69 | Ht 66.0 in | Wt 197.6 lb

## 2021-04-22 DIAGNOSIS — I10 Essential (primary) hypertension: Secondary | ICD-10-CM | POA: Diagnosis not present

## 2021-04-22 DIAGNOSIS — R252 Cramp and spasm: Secondary | ICD-10-CM | POA: Diagnosis not present

## 2021-04-22 DIAGNOSIS — B9689 Other specified bacterial agents as the cause of diseases classified elsewhere: Secondary | ICD-10-CM

## 2021-04-22 DIAGNOSIS — J019 Acute sinusitis, unspecified: Secondary | ICD-10-CM | POA: Diagnosis not present

## 2021-04-22 MED ORDER — DOXYCYCLINE HYCLATE 100 MG PO TABS: 100.0000 mg | ORAL_TABLET | Freq: Two times a day (BID) | ORAL | 0 refills | Status: DC

## 2021-04-22 MED ORDER — LISINOPRIL 10 MG PO TABS: 10.0000 mg | ORAL_TABLET | Freq: Every day | ORAL | 1 refills | Status: DC

## 2021-04-22 NOTE — Progress Notes (Signed)
   Subjective:    Patient ID: Pamela Mendez, female    DOB: [birth year], 55 y.o.   MRN: [id number]  HPI  Patient arrives to discuss continued low blood pressure. Patient states she was seen a few weeks ago and her lisinopril was decreased to 20mg  but her blood pressure is still running low.  She states she has had several low blood pressure readings despite being on 20 mg She has been exercising watching diet losing weight   She also describes a lot of sinus symptoms sinus pressure drainage coughing hurting in her sinuses pressure over the past several weeks  Has underlying diabetes Review of Systems     Objective:   Physical Exam General-in no acute distress Eyes-no discharge Lungs-respiratory rate normal, CTA CV-no murmurs,RRR Extremities skin warm dry no edema Neuro grossly normal Behavior normal, alert  Blood pressure on the low end Blood pressure correlates with her machine Checked and verified by myself      Assessment & Plan:   1. Primary hypertension Blood pressure low end stop amlodipine Reduce lisinopril to 10 mg daily Reduce torsemide down to 2 in the morning and 1 in the midday Reduce potassium to 2 in the morning 2 in the evening Send this update on MyChart blood pressure readings Check metabolic 7 within 7 to 10 days Follow-up if progressive troubles  2. Muscle cramps We will check that about 7 next week Reducing diuretic reducing lisinopril stopping amlodipine  3. Acute bacterial rhinosinusitis Doxycycline twice daily for 7 days

## 2021-04-22 NOTE — Patient Instructions (Addendum)
Reduce your meds  Lisinopril 10 mg one a day Stop amlodipine  Reduce torsemide to 2 in the morning and 1 in the afternoon Reduce potassium to 2 in the morning and 2 late in the afternoon  Do your labs within 7 to 10 days  Send mychart update 7 to 14 days regarding your blood pressure  Also use the doxycycline twice daily for 7 days for the sinuses avoid excessive sun  Please keep follow-up in September

## 2021-05-07 ENCOUNTER — Encounter: Payer: Self-pay | Admitting: Family Medicine

## 2021-05-10 DIAGNOSIS — R252 Cramp and spasm: Secondary | ICD-10-CM | POA: Diagnosis not present

## 2021-05-10 DIAGNOSIS — I10 Essential (primary) hypertension: Secondary | ICD-10-CM | POA: Diagnosis not present

## 2021-05-11 LAB — BASIC METABOLIC PANEL
BUN/Creatinine Ratio: 15 (ref 9–23)
BUN: 13 mg/dL (ref 6–24)
CO2: 20 mmol/L (ref 20–29)
Calcium: 9.4 mg/dL (ref 8.7–10.2)
Chloride: 108 mmol/L — ABNORMAL HIGH (ref 96–106)
Creatinine, Ser: 0.85 mg/dL (ref 0.57–1.00)
Glucose: 113 mg/dL — ABNORMAL HIGH (ref 65–99)
Potassium: 4.7 mmol/L (ref 3.5–5.2)
Sodium: 142 mmol/L (ref 134–144)
eGFR: 81 mL/min/{1.73_m2} (ref >59)

## 2021-05-17 ENCOUNTER — Other Ambulatory Visit: Payer: Self-pay

## 2021-05-17 MED ORDER — MONTELUKAST SODIUM 10 MG PO TABS: 10.0000 mg | ORAL_TABLET | Freq: Every day | ORAL | 1 refills | Status: DC

## 2021-05-19 ENCOUNTER — Ambulatory Visit: Payer: Medicaid Other | Admitting: Family Medicine

## 2021-05-19 ENCOUNTER — Other Ambulatory Visit: Payer: Self-pay

## 2021-05-19 VITALS — BP 110/75 | HR 81 | Ht 66.0 in | Wt 189.2 lb

## 2021-05-19 DIAGNOSIS — Z23 Encounter for immunization: Secondary | ICD-10-CM

## 2021-05-19 DIAGNOSIS — R7303 Prediabetes: Secondary | ICD-10-CM

## 2021-05-19 DIAGNOSIS — G8929 Other chronic pain: Secondary | ICD-10-CM

## 2021-05-19 DIAGNOSIS — E1169 Type 2 diabetes mellitus with other specified complication: Secondary | ICD-10-CM

## 2021-05-19 DIAGNOSIS — Z1159 Encounter for screening for other viral diseases: Secondary | ICD-10-CM | POA: Diagnosis not present

## 2021-05-19 DIAGNOSIS — I10 Essential (primary) hypertension: Secondary | ICD-10-CM

## 2021-05-19 DIAGNOSIS — Z79891 Long term (current) use of opiate analgesic: Secondary | ICD-10-CM | POA: Diagnosis not present

## 2021-05-19 DIAGNOSIS — Z1211 Encounter for screening for malignant neoplasm of colon: Secondary | ICD-10-CM | POA: Diagnosis not present

## 2021-05-19 DIAGNOSIS — E785 Hyperlipidemia, unspecified: Secondary | ICD-10-CM | POA: Diagnosis not present

## 2021-05-19 DIAGNOSIS — M5440 Lumbago with sciatica, unspecified side: Secondary | ICD-10-CM | POA: Diagnosis not present

## 2021-05-19 MED ORDER — OXYCODONE-ACETAMINOPHEN 5-325 MG PO TABS: ORAL_TABLET | ORAL | 0 refills | Status: DC

## 2021-05-19 MED ORDER — POTASSIUM CHLORIDE CRYS ER 20 MEQ PO TBCR: EXTENDED_RELEASE_TABLET | ORAL | 5 refills | Status: DC

## 2021-05-19 MED ORDER — PANTOPRAZOLE SODIUM 40 MG PO TBEC
40.0000 mg | DELAYED_RELEASE_TABLET | Freq: Every day | ORAL | 1 refills | Status: DC
Start: 2021-05-19 — End: 2021-11-16

## 2021-05-19 MED ORDER — MONTELUKAST SODIUM 10 MG PO TABS: 10.0000 mg | ORAL_TABLET | Freq: Every day | ORAL | 1 refills | Status: DC

## 2021-05-19 MED ORDER — METFORMIN HCL 500 MG PO TABS: 500.0000 mg | ORAL_TABLET | Freq: Two times a day (BID) | ORAL | 1 refills | Status: DC

## 2021-05-19 MED ORDER — TORSEMIDE 20 MG PO TABS: ORAL_TABLET | ORAL | 5 refills | Status: DC

## 2021-05-19 NOTE — Patient Instructions (Addendum)
Ozempic  Trulicity Victoza Reybelsus

## 2021-05-19 NOTE — Progress Notes (Signed)
Subjective:    Patient ID: Pamela Mendez, female    DOB: Apr 15, 1966, 55 y.o.   MRN: [id number]  HPI  This patient was seen today for chronic pain  The medication list was reviewed and updated.  Location of Pain for which the patient has been treated with regarding narcotics: back pain  Onset of this pain: years   -Compliance with medication: yes  - Number patient states they take daily: 4 a day  -when was the last dose patient took? today  The patient was advised the importance of maintaining medication and not using illegal substances with these.  Here for refills and follow up  The patient was educated that we can provide 3 monthly scripts for their medication, it is their responsibility to follow the instructions.  Side effects or complications from medications: none  Patient is aware that pain medications are meant to minimize the severity of the pain to allow their pain levels to improve to allow for better function. They are aware of that pain medications cannot totally remove their pain.  Due for UDT ( at least once per year) : today  Scale of 1 to 10 ( 1 is least 10 is most) Your pain level without the medicine: 9-9.5 Your pain level with medication 3  Encounter for long-term opiate analgesic use - Plan: ToxASSURE Select 13 (MW), Urine  Hyperlipidemia associated with type 2 diabetes mellitus (HCC) - Plan: Lipid panel  Chronic low back pain with sciatica, sciatica laterality unspecified, unspecified back pain laterality  Primary hypertension  Encounter for hepatitis C screening test for low risk patient - Plan: Hepatitis C Antibody  Prediabetes - Plan: Hemoglobin A1c, Microalbumin / creatinine urine ratio, Basic metabolic panel  Screening for colon cancer - Plan: Ambulatory referral to Gastroenterology  Need for vaccination - Plan: Flu Vaccine QUAD 69mo+IM (Fluarix, Fluzone & Alfiuria Quad PF)  Long discussion held with patient today about getting a  colonoscopy, also discussing her diabetes and consideration for GLP-1 treatment in the future to see if they will help her diabetes and her weight Plus also healthy diet etc. Review of Systems     Objective:   Physical Exam  General-in no acute distress Eyes-no discharge Lungs-respiratory rate normal, CTA CV-no murmurs,RRR Extremities skin warm dry no edema Neuro grossly normal Behavior normal, alert       Assessment & Plan:  1. Encounter for long-term opiate analgesic use Urine drug screen The patient was seen in followup for chronic pain. A review over at their current pain status was discussed. Drug registry was checked. Prescriptions were given.  Regular follow-up recommended. Discussion was held regarding the importance of compliance with medication as well as pain medication contract.  Patient was informed that medication may cause drowsiness and should not be combined  with other medications/alcohol or street drugs. If the patient feels medication is causing altered alertness then do not drive or operate dangerous equipment. She avoids medications if she drives but states the medicine does not cause her to feel drowsy Drug registry was checked 3 scripts sent in Patient doing well with medicine not abusing - ToxASSURE Select 13 (MW), Urine  2. Hyperlipidemia associated with type 2 diabetes mellitus (HCC) She is working hard at exercising eating properly hopefully her labs look good do lab work before next follow-up she is interested in GLP-1's she will do some readings if she is still interested on next follow-up we will consider starting 1 to help her with her diabetes  and potentially her weight  3. Chronic low back pain with sciatica, sciatica laterality unspecified, unspecified back pain laterality Pain medication as per above stretching exercises  4. Primary hypertension Blood pressure under very good control with the lisinopril at the current dose 10 mg plus also  with the other medication

## 2021-05-24 ENCOUNTER — Encounter: Payer: Self-pay | Admitting: Internal Medicine

## 2021-05-24 LAB — TOXASSURE SELECT 13 (MW), URINE

## 2021-06-20 ENCOUNTER — Encounter: Payer: Self-pay | Admitting: Family Medicine

## 2021-06-30 ENCOUNTER — Encounter: Payer: Self-pay | Admitting: Family Medicine

## 2021-06-30 NOTE — Telephone Encounter (Signed)
Nurses (Diabetic shoes are for individuals who have neuropathy.  Please check with patient-does she currently have neuropathy of her feet such as burning numbness tingling?  If so it is almost impossible to tell when her last diabetic foot exam was if you do not find in the chart she will need a office visit to do a diabetic foot exam in order to order the diabetic shoes it is important for the patient to understand that diabetic shoes are only covered for individuals with diabetes who have neuropathy thank you)

## 2021-07-15 ENCOUNTER — Other Ambulatory Visit: Payer: Self-pay | Admitting: Family Medicine

## 2021-08-14 ENCOUNTER — Telehealth: Payer: Self-pay | Admitting: Family Medicine

## 2021-08-14 NOTE — Telephone Encounter (Signed)
Please have the patient do her lab work before her follow-up office visit preferably at least 1 to 2 days ahead of time it has already been ordered

## 2021-08-15 NOTE — Telephone Encounter (Signed)
Patient has been informed per drs notes and recommendations.  

## 2021-08-16 DIAGNOSIS — Z1159 Encounter for screening for other viral diseases: Secondary | ICD-10-CM | POA: Diagnosis not present

## 2021-08-16 DIAGNOSIS — R7303 Prediabetes: Secondary | ICD-10-CM | POA: Diagnosis not present

## 2021-08-16 DIAGNOSIS — E785 Hyperlipidemia, unspecified: Secondary | ICD-10-CM | POA: Diagnosis not present

## 2021-08-16 DIAGNOSIS — E1169 Type 2 diabetes mellitus with other specified complication: Secondary | ICD-10-CM | POA: Diagnosis not present

## 2021-08-17 LAB — BASIC METABOLIC PANEL
BUN/Creatinine Ratio: 11 (ref 9–23)
BUN: 10 mg/dL (ref 6–24)
CO2: 23 mmol/L (ref 20–29)
Calcium: 9.5 mg/dL (ref 8.7–10.2)
Chloride: 104 mmol/L (ref 96–106)
Creatinine, Ser: 0.87 mg/dL (ref 0.57–1.00)
Glucose: 119 mg/dL — ABNORMAL HIGH (ref 70–99)
Potassium: 4.3 mmol/L (ref 3.5–5.2)
Sodium: 141 mmol/L (ref 134–144)
eGFR: 79 mL/min/{1.73_m2} (ref >59)

## 2021-08-17 LAB — HEMOGLOBIN A1C
Est. average glucose Bld gHb Est-mCnc: 131 mg/dL
Hgb A1c MFr Bld: 6.2 % — ABNORMAL HIGH (ref 4.8–5.6)

## 2021-08-17 LAB — LIPID PANEL
Chol/HDL Ratio: 3.3 ratio (ref 0.0–4.4)
Cholesterol, Total: 164 mg/dL (ref 100–199)
HDL: 50 mg/dL (ref >39)
LDL Chol Calc (NIH): 90 mg/dL (ref 0–99)
Triglycerides: 135 mg/dL (ref 0–149)
VLDL Cholesterol Cal: 24 mg/dL (ref 5–40)

## 2021-08-17 LAB — HEPATITIS C ANTIBODY: Hep C Virus Ab: <0.1 s/co ratio (ref 0.0–0.9)

## 2021-08-17 LAB — MICROALBUMIN / CREATININE URINE RATIO
Creatinine, Urine: 131.4 mg/dL
Microalb/Creat Ratio: 13 mg/g creat (ref 0–29)
Microalbumin, Urine: 17.2 ug/mL

## 2021-08-19 ENCOUNTER — Other Ambulatory Visit: Payer: Self-pay

## 2021-08-19 ENCOUNTER — Ambulatory Visit: Payer: Medicaid Other | Admitting: Family Medicine

## 2021-08-19 VITALS — BP 118/80 | HR 78 | Temp 98.4°F | Ht 66.0 in | Wt 178.8 lb

## 2021-08-19 DIAGNOSIS — J0181 Other acute recurrent sinusitis: Secondary | ICD-10-CM

## 2021-08-19 DIAGNOSIS — E785 Hyperlipidemia, unspecified: Secondary | ICD-10-CM | POA: Diagnosis not present

## 2021-08-19 DIAGNOSIS — E1169 Type 2 diabetes mellitus with other specified complication: Secondary | ICD-10-CM

## 2021-08-19 DIAGNOSIS — I10 Essential (primary) hypertension: Secondary | ICD-10-CM | POA: Diagnosis not present

## 2021-08-19 DIAGNOSIS — E119 Type 2 diabetes mellitus without complications: Secondary | ICD-10-CM

## 2021-08-19 MED ORDER — ROSUVASTATIN CALCIUM 20 MG PO TABS: 20.0000 mg | ORAL_TABLET | Freq: Every day | ORAL | 1 refills | Status: DC

## 2021-08-19 MED ORDER — OXYCODONE-ACETAMINOPHEN 5-325 MG PO TABS: ORAL_TABLET | ORAL | 0 refills | Status: DC

## 2021-08-19 MED ORDER — OXYCODONE-ACETAMINOPHEN 5-325 MG PO TABS
ORAL_TABLET | ORAL | 0 refills | Status: DC
Start: 2021-08-19 — End: 2021-11-16

## 2021-08-19 MED ORDER — DOXYCYCLINE HYCLATE 100 MG PO TABS: 100.0000 mg | ORAL_TABLET | Freq: Two times a day (BID) | ORAL | 0 refills | Status: DC

## 2021-08-19 MED ORDER — LISINOPRIL 10 MG PO TABS: 10.0000 mg | ORAL_TABLET | Freq: Every day | ORAL | 1 refills | Status: DC

## 2021-08-19 NOTE — Progress Notes (Signed)
Subjective:    Patient ID: Pamela Mendez, female    DOB: 06/01/1966, 55 y.o.   MRN: [id number]  HPI  This patient was seen today for chronic pain  The medication list was reviewed and updated.  Location of Pain for which the patient has been treated with regarding narcotics: Back  Onset of this pain: years   -Compliance with medication: Yes  - Number patient states they take daily: 1 tablet four times a day as needed  -when was the last dose patient took? Last night around 9:30 pm  The patient was advised the importance of maintaining medication and not using illegal substances with these.  Here for refills and follow up  The patient was educated that we can provide 3 monthly scripts for their medication, it is their responsibility to follow the instructions.  Side effects or complications from medications: None  Patient is aware that pain medications are meant to minimize the severity of the pain to allow their pain levels to improve to allow for better function. They are aware of that pain medications cannot totally remove their pain.  Due for UDT ( at least once per year) : 05/19/21  Scale of 1 to 10 ( 1 is least 10 is most) Your pain level without the medicine: 9 or 10 Your pain level with medication :3 or 4  She is done amazing job of eating healthy staying active and exercising on a regular basis.  She is lost almost 50 pounds she is working hard at trying to get to a lower weight she states she feels better.  Still uses gabapentin to help with her sciatica and she also states pain medicine does help her with her overall function  She denies any low sugar spells.   Has had ongoing sinus pressure pain and discomfort for several days     Review of Systems     Objective:   Physical Exam  General-in no acute distress Eyes-no discharge Lungs-respiratory rate normal, CTA CV-no murmurs,RRR Extremities skin warm dry no edema Neuro grossly normal Behavior  normal, alert Moderate sinus tenderness      Assessment & Plan:  Sinusitis-antibiotics indicated.  Continue current measures.  With allergy medicines.  The patient was seen in followup for chronic pain. A review over at their current pain status was discussed. Drug registry was checked. Prescriptions were given.  Regular follow-up recommended. Discussion was held regarding the importance of compliance with medication as well as pain medication contract.  Patient was informed that medication may cause drowsiness and should not be combined  with other medications/alcohol or street drugs. If the patient feels medication is causing altered alertness then do not drive or operate dangerous equipment.  Should be noted that the patient appears to be meeting appropriate use of opioids and response.  Evidenced by improved function and decent pain control without significant side effects and no evidence of overt aberrancy issues.  Upon discussion with the patient today they understand that opioid therapy is optional and they feel that the pain has been refractory to reasonable conservative measures and is significant and affecting quality of life enough to warrant ongoing therapy and wishes to continue opioids.  Refills were provided.  Her pain medicine does benefit her 3 scripts were sent in she uses the medicine responsibly drug registry checked out well  Diabetes-continue current medication check A1c before next visit  Hyperlipidemia associated with diabetes LDL not at goal she would like to give it 3 more  months if it still is not at goal she is okay with going up on the dose of her medicine  She will continue to eat healthy stay active and lose weight

## 2021-08-30 IMAGING — DX DG LUMBAR SPINE COMPLETE 4+V
5 series · 5 of 5 positions shown · non-contrast
Comparison: 12/07/2009

CLINICAL DATA: Chronic low back pain

EXAM:
LUMBAR SPINE - COMPLETE 4+ VIEW

[l-spine ap]
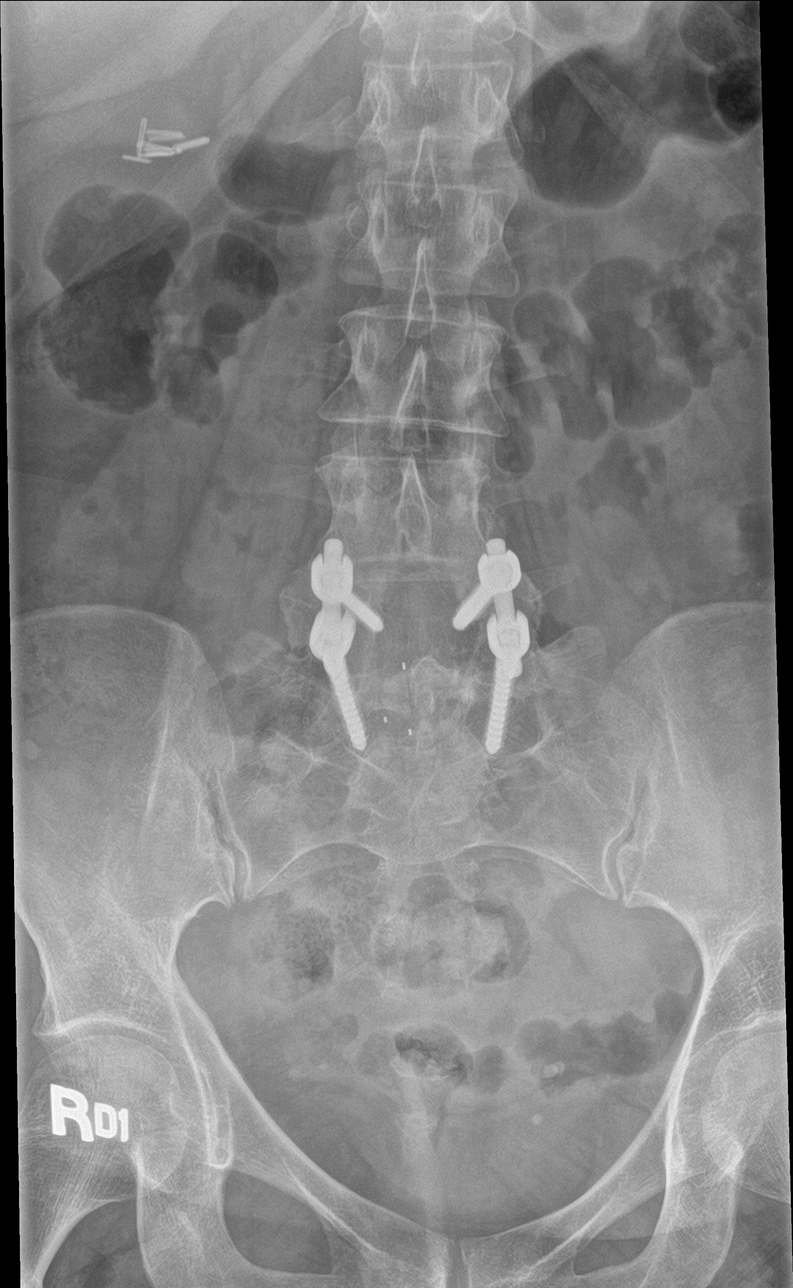

[l-spine obl (1 of 2)]
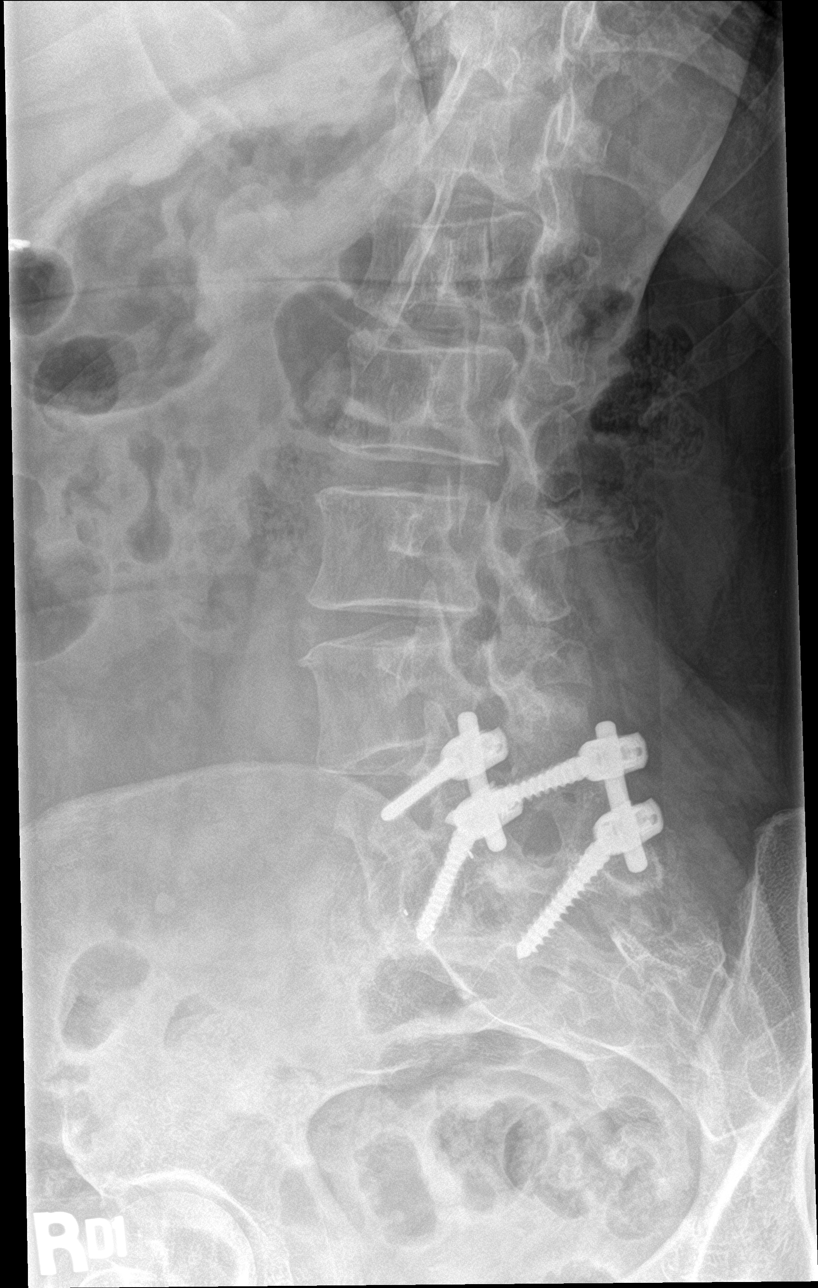

[l-spine obl (2 of 2)]
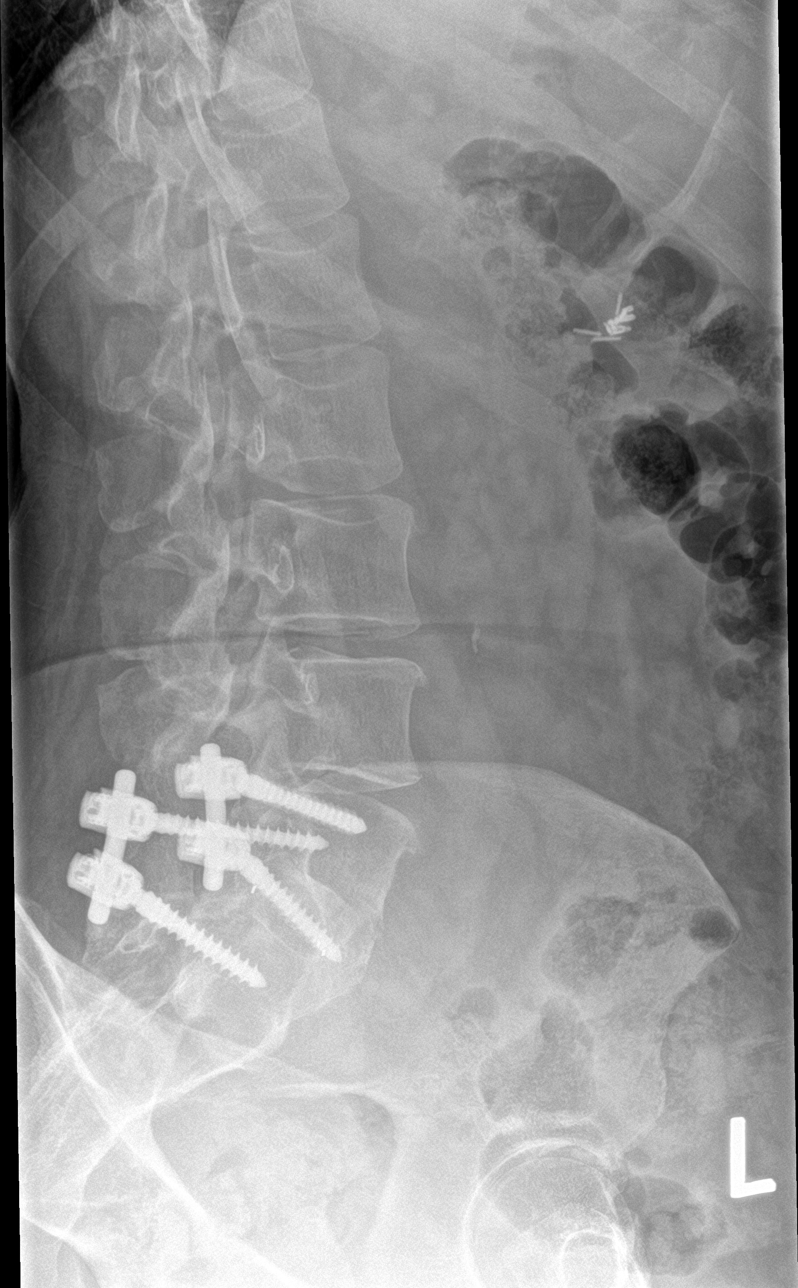

[l-spine lat]
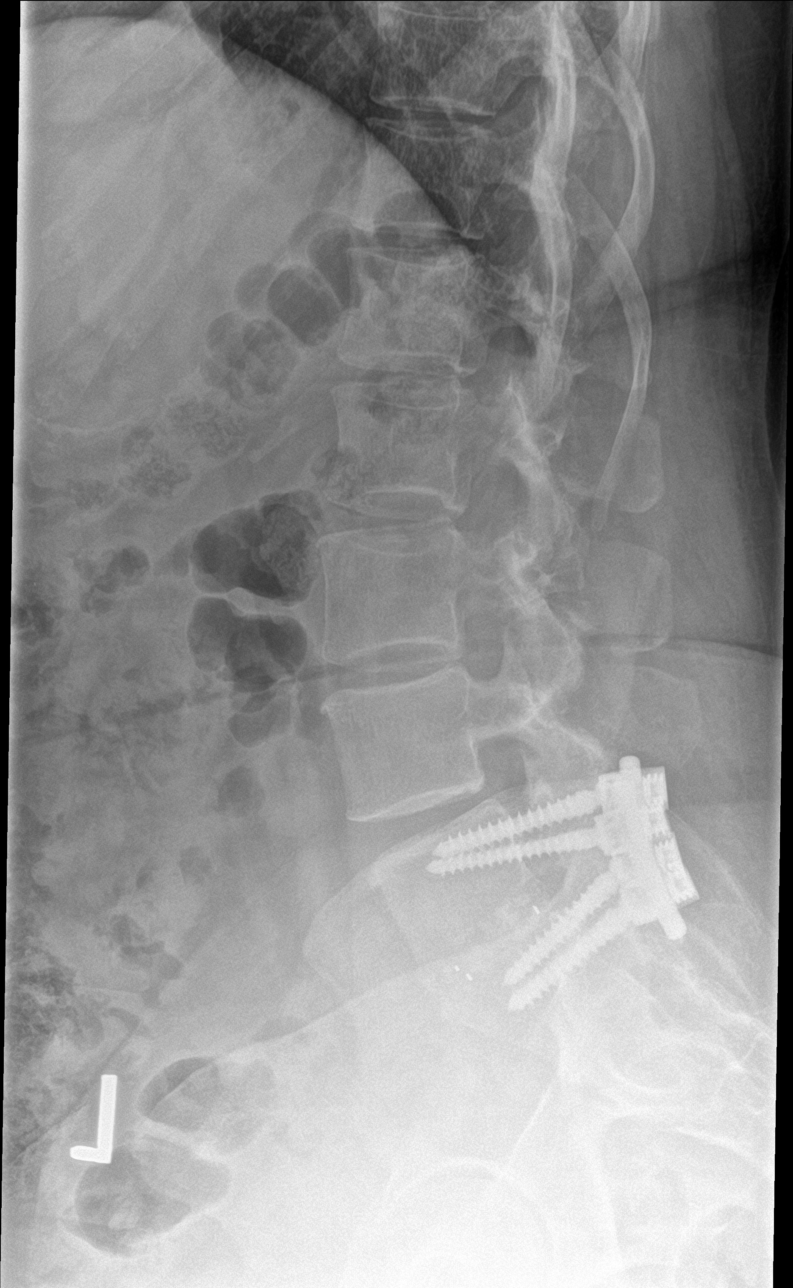

[l-spine spot]
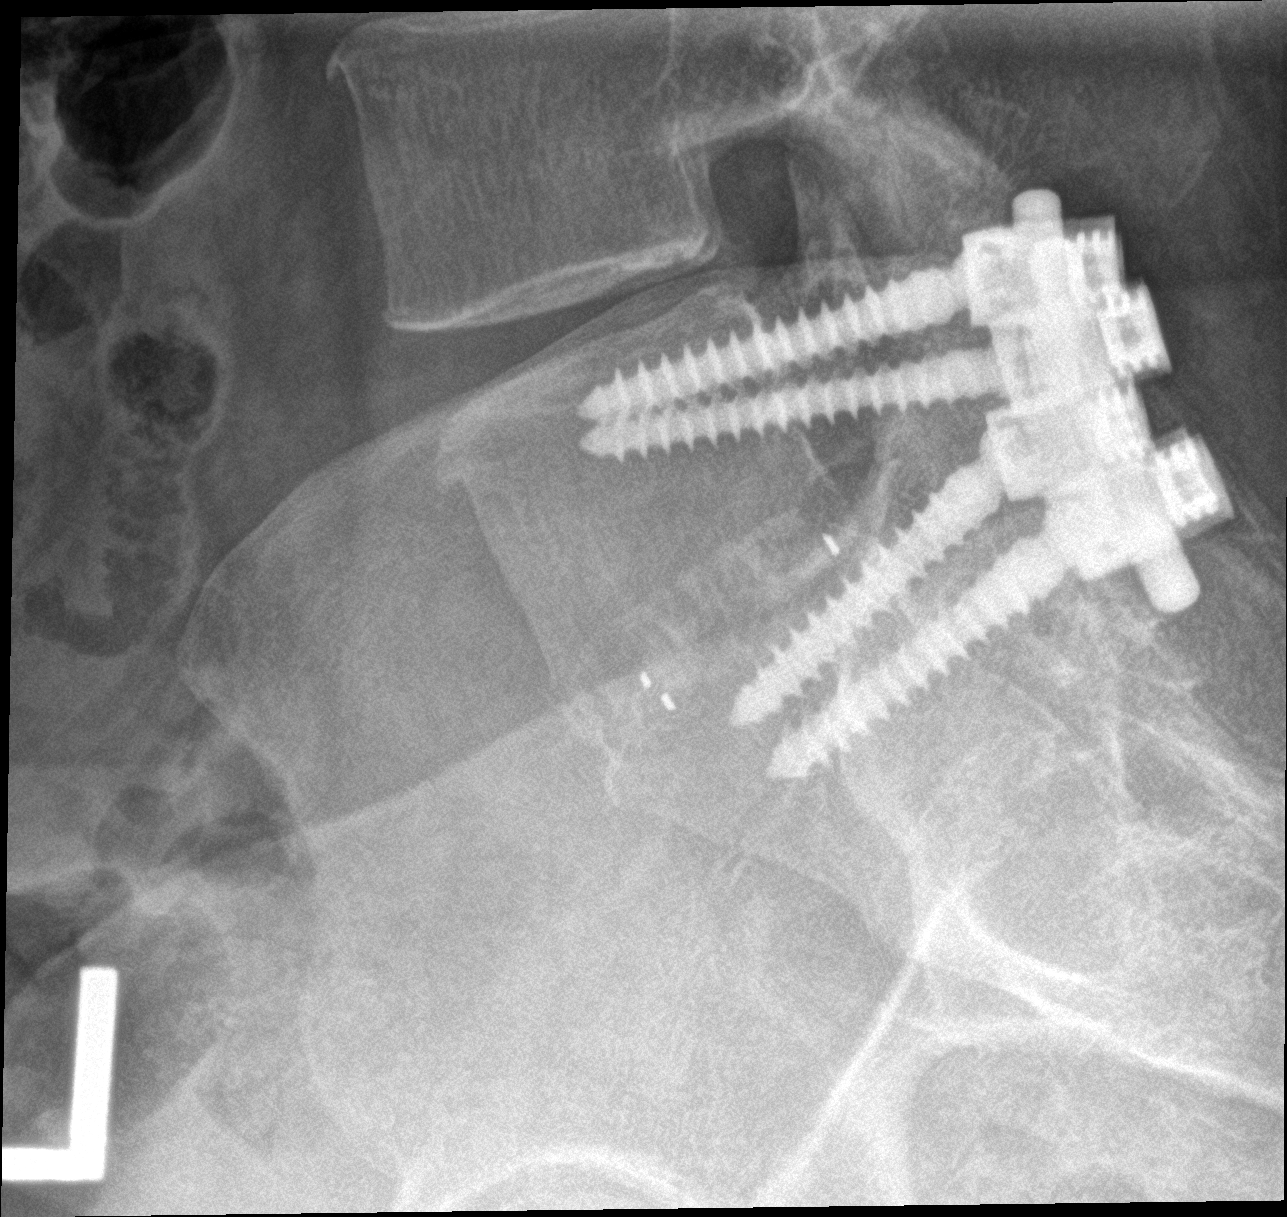

[5 of 5 positions shown; findings below may reference images not displayed]

FINDINGS: Postoperative changes from posterior fusion at L5-S1. No fracture or
malalignment. SI joints symmetric and unremarkable. Disc spaces
maintained.
IMPRESSION: Posterior fusion L5-S1.

No acute bony abnormality.

## 2021-09-26 ENCOUNTER — Other Ambulatory Visit: Payer: Self-pay

## 2021-09-26 ENCOUNTER — Encounter: Payer: Self-pay | Admitting: Nurse Practitioner

## 2021-09-26 ENCOUNTER — Ambulatory Visit (INDEPENDENT_AMBULATORY_CARE_PROVIDER_SITE_OTHER): Payer: Medicaid Other | Admitting: Nurse Practitioner

## 2021-09-26 VITALS — BP 126/85 | HR 89 | Temp 98.0°F | Ht 66.0 in | Wt 180.0 lb

## 2021-09-26 DIAGNOSIS — B9689 Other specified bacterial agents as the cause of diseases classified elsewhere: Secondary | ICD-10-CM | POA: Diagnosis not present

## 2021-09-26 DIAGNOSIS — J019 Acute sinusitis, unspecified: Secondary | ICD-10-CM

## 2021-09-26 DIAGNOSIS — J329 Chronic sinusitis, unspecified: Secondary | ICD-10-CM

## 2021-09-26 MED ORDER — DOXYCYCLINE HYCLATE 100 MG PO TABS: 100.0000 mg | ORAL_TABLET | Freq: Two times a day (BID) | ORAL | 0 refills | Status: DC

## 2021-09-26 MED ORDER — METHYLPREDNISOLONE SODIUM SUCC 40 MG IJ SOLR
40.0000 mg | Freq: Once | INTRAMUSCULAR | Status: AC
  Administered 2021-09-26: 40 mg via INTRAMUSCULAR

## 2021-09-26 NOTE — Progress Notes (Signed)
Subjective:    Patient ID: Pamela Mendez, female    DOB: 09-Sep-1965, 56 y.o.   MRN: [id number]  HPI Patient here for sinus pressure behind left eye, left maxillary sinus pressure, is general sinus pain for 3 days.  Patient states that she has recurrent sinus infections that typically happen around the same time each year.  Patient used to be followed by ENT.  Patient states she was prescribed Singulair and Zyrtec last year which seemed to help with the frequency of her sinus infection.  Patient denies fevers, chills, cough, wheezing, shortness of breath, postnasal drip, or runny nose.  Patient does admit to intermittent headaches that she is such associates with her sinuses.     Review of Systems  HENT:  Positive for sinus pressure and sinus pain.   Neurological:  Positive for headaches.  All other systems reviewed and are negative.     Objective:   Physical Exam Constitutional:      General: She is not in acute distress.    Appearance: Normal appearance. She is normal weight. She is not ill-appearing or toxic-appearing.  HENT:     Head: Normocephalic and atraumatic.     Right Ear: Hearing, tympanic membrane, ear canal and external ear normal. No decreased hearing noted. No laceration, drainage, swelling or tenderness. No middle ear effusion. There is no impacted cerumen. No foreign body. No mastoid tenderness. A PE tube is present. No hemotympanum. Tympanic membrane is not injected, scarred, perforated, erythematous, retracted or bulging.     Left Ear: Hearing, tympanic membrane, ear canal and external ear normal. No decreased hearing noted. No laceration, drainage, swelling or tenderness.  No middle ear effusion. There is no impacted cerumen. No foreign body. No mastoid tenderness. No PE tube. No hemotympanum. Tympanic membrane is not injected, scarred, perforated, erythematous, retracted or bulging.     Ears:     Comments: Ear tube seems to be dislodged from right ear.  Mild cerumen  impaction noted to bilateral ears.    Nose: Nose normal. No congestion or rhinorrhea.     Right Turbinates: Pale. Not enlarged or swollen.     Left Turbinates: Pale. Not enlarged or swollen.     Mouth/Throat:     Mouth: Mucous membranes are moist.  Cardiovascular:     Rate and Rhythm: Normal rate and regular rhythm.     Pulses: Normal pulses.     Heart sounds: Normal heart sounds. No murmur heard. Pulmonary:     Effort: Pulmonary effort is normal. No respiratory distress.     Breath sounds: Normal breath sounds. No wheezing.  Musculoskeletal:        General: Normal range of motion.     Cervical back: Normal range of motion. No rigidity or tenderness.  Lymphadenopathy:     Cervical: No cervical adenopathy.  Skin:    General: Skin is warm.  Neurological:     General: No focal deficit present.     Mental Status: She is alert and oriented to person, place, and time.  Psychiatric:        Mood and Affect: Mood normal.        Behavior: Behavior normal.          Assessment & Plan:   1. Acute bacterial rhinosinusitis -Suspect bacterial rhinosinusitis. - Patient has allergy to Augmentin.  Doxycycline prescribed. - doxycycline (VIBRA-TABS) 100 MG tablet; Take 1 tablet (100 mg total) by mouth 2 (two) times daily.  Dispense: 20 tablet; Refill: 0 -  methylPREDNISolone sodium succinate (SOLU-MEDROL) 40 mg/mL injection 40 mg - Referral to ENT placed. - Return to clinic if symptoms persist or do not get better  2. Recurrent rhinosinusitis - Ambulatory referral to ENT

## 2021-10-03 ENCOUNTER — Ambulatory Visit: Payer: Medicaid Other | Admitting: Gastroenterology

## 2021-10-03 ENCOUNTER — Encounter: Payer: Self-pay | Admitting: Gastroenterology

## 2021-10-04 ENCOUNTER — Other Ambulatory Visit: Payer: Self-pay | Admitting: Family Medicine

## 2021-11-03 ENCOUNTER — Other Ambulatory Visit: Payer: Self-pay | Admitting: Family Medicine

## 2021-11-16 ENCOUNTER — Other Ambulatory Visit: Payer: Self-pay

## 2021-11-16 ENCOUNTER — Ambulatory Visit (INDEPENDENT_AMBULATORY_CARE_PROVIDER_SITE_OTHER): Payer: Medicaid Other | Admitting: Family Medicine

## 2021-11-16 ENCOUNTER — Encounter: Payer: Self-pay | Admitting: Family Medicine

## 2021-11-16 VITALS — BP 120/70 | Temp 97.9°F | Wt 183.2 lb

## 2021-11-16 DIAGNOSIS — E785 Hyperlipidemia, unspecified: Secondary | ICD-10-CM

## 2021-11-16 DIAGNOSIS — Z79891 Long term (current) use of opiate analgesic: Secondary | ICD-10-CM

## 2021-11-16 DIAGNOSIS — E1169 Type 2 diabetes mellitus with other specified complication: Secondary | ICD-10-CM | POA: Diagnosis not present

## 2021-11-16 DIAGNOSIS — I10 Essential (primary) hypertension: Secondary | ICD-10-CM

## 2021-11-16 MED ORDER — POTASSIUM CHLORIDE CRYS ER 20 MEQ PO TBCR: EXTENDED_RELEASE_TABLET | ORAL | 3 refills | Status: AC

## 2021-11-16 MED ORDER — PANTOPRAZOLE SODIUM 40 MG PO TBEC: 40.0000 mg | DELAYED_RELEASE_TABLET | Freq: Every day | ORAL | 1 refills | Status: DC

## 2021-11-16 MED ORDER — CETIRIZINE HCL 10 MG PO TABS: ORAL_TABLET | ORAL | 3 refills | Status: DC

## 2021-11-16 MED ORDER — LISINOPRIL 5 MG PO TABS: 5.0000 mg | ORAL_TABLET | Freq: Every day | ORAL | 1 refills | Status: DC

## 2021-11-16 MED ORDER — OXYCODONE-ACETAMINOPHEN 5-325 MG PO TABS: ORAL_TABLET | ORAL | 0 refills | Status: DC

## 2021-11-16 MED ORDER — TORSEMIDE 20 MG PO TABS: ORAL_TABLET | ORAL | 3 refills | Status: AC

## 2021-11-16 MED ORDER — METFORMIN HCL 500 MG PO TABS
500.0000 mg | ORAL_TABLET | Freq: Two times a day (BID) | ORAL | 1 refills | Status: DC
Start: 2021-11-16 — End: 2022-05-30

## 2021-11-16 MED ORDER — GABAPENTIN 300 MG PO CAPS: ORAL_CAPSULE | ORAL | 3 refills | Status: DC

## 2021-11-16 MED ORDER — ALBUTEROL SULFATE HFA 108 (90 BASE) MCG/ACT IN AERS: INHALATION_SPRAY | RESPIRATORY_TRACT | 3 refills | Status: DC

## 2021-11-16 MED ORDER — ROSUVASTATIN CALCIUM 20 MG PO TABS
20.0000 mg | ORAL_TABLET | Freq: Every day | ORAL | 1 refills | Status: DC
Start: 2021-11-16 — End: 2022-05-10

## 2021-11-16 MED ORDER — FLUTICASONE PROPIONATE 50 MCG/ACT NA SUSP
NASAL | 12 refills | Status: DC
Start: 2021-11-16 — End: 2022-05-16

## 2021-11-16 NOTE — Progress Notes (Addendum)
? ?Subjective:  ? ? Patient ID: Pamela Mendez, female    DOB: Apr 30, 1966, 56 y.o.   MRN: [id number] ? ?Diabetes ?She presents for her follow-up diabetic visit. She has type 2 diabetes mellitus. There are no hypoglycemic associated symptoms. There are no diabetic associated symptoms. There are no hypoglycemic complications.  ? ?This patient was seen today for chronic pain ? ?The medication list was reviewed and updated. ? ?Location of Pain for which the patient has been treated with regarding narcotics:  ? ?Onset of this pain:  ? ?This patient has had 2 back surgeries 1 in 2005 another one in 2010.  She had fusion in her back.  She still suffers with low back pain and sciatica worse in the left leg than the right leg. ?Her pain is not adequately controlled with anti-inflammatories ?She has done physical therapy years ago when she had her surgeries ?Those records are not available currently ?Records that we have extend back to 2014 ?There are paper records stored at separate facility that go back to the time of her surgery ?Neurosurgeons have told her no further surgery would be helpful for her pain ?Her pain is not adequately controlled with anti-inflammatories, home exercises, stretches and exercises that she was shown via physical therapy ? ?She started on hydrocodone back around 2013 at the 5 mg and gradually over time her body acclimated/tolerated pain medicine to where increased dose had to be utilized in order to get response ? ?She is also been on and currently is on gabapentin ? ?Through time she is gone from 5 mg of hydrocodone multiple times a day to 7.5 mg several times per day to 10 mg hydrocodone 4-5 times per day ?Over the past 5 years oxycodone 5 mg currently taking 4 times per day ?This is well under the guideline limits for [place] [facility] ?She does pain management visits every 3 months ?She is monitored closely ?There has been no sign of abuse ?She has a yearly drug screen ?She meets all  the legitimate criteria by the state medical board for monitoring and prescribing of pain medications. ?She also has an improvement in her quality of life with the pain medicine and without the pain medicine she faces increased pain discomfort and further debilitation of her ADLs. ?Use of this pain medication is medically indicated ?Use of this pain medicine is backed by pain management protocols ?She adheres to all state and federal long-term opioid pain management agreement this. ?She does have a pain agreement in place with [place] here ? ? -Compliance with medication: oxycodone 5-325 ? ?- Number patient states they take daily: 4 ? ?-when was the last dose patient took? Last night-pt states she does not take them when she drives ? ?The patient was advised the importance of maintaining medication and not using illegal substances with these. ? ?Here for refills and follow up ? ?The patient was educated that we can provide 3 monthly scripts for their medication, it is their responsibility to follow the instructions. ? ?Side effects or complications from medications: none ? ?Patient is aware that pain medications are meant to minimize the severity of the pain to allow their pain levels to improve to allow for better function. They are aware of that pain medications cannot totally remove their pain. ? ?Due for UDT ( at least once per year) : last completed 05/19/21 ? ?Scale of 1 to 10 ( 1 is least 10 is most) ?Your pain level without the medicine: 9 ?  Your pain level with medication: 3 ? ?Scale 1 to 10 ( 1-helps very little, 10 helps very well) ?How well does your pain medication reduce your pain so you can function better through out the day? 10 ? ?Quality of the pain: Constant low back pain discomfort sometimes into the left leg sometimes right ? ?Persistence of the pain: Present all the time ? ?Modifying factors: Worse with activity ? ? ? Primary hypertension ? ?Hyperlipidemia associated with type 2 diabetes mellitus  (HCC) ? ?Encounter for long-term opiate analgesic use ?  ? ? ?Review of Systems ? ?   ?Objective:  ? Physical Exam ?General-in no acute distress ?Eyes-no discharge ?Lungs-respiratory rate normal, CTA ?CV-no murmurs,RRR ?Extremities skin warm dry no edema ?Neuro grossly normal ?Behavior normal, alert ? ? ? ? ?   ?Assessment & Plan:  ?1. Primary hypertension ?Blood pressure under good control continue current measures ? ?2. Hyperlipidemia associated with type 2 diabetes mellitus (HCC) ?Previous cholesterol looks very good lab work before next visit ? ?3. Encounter for long-term opiate analgesic use ?The patient was seen in followup for chronic pain. ?A review over at their current pain status was discussed. Drug registry was checked. ?Prescriptions were given.  Regular follow-up recommended. ?Discussion was held regarding the importance of compliance with medication as well as pain medication contract. ? ?Patient was informed that medication may cause drowsiness and should not be combined  with other medications/alcohol or street drugs. If the patient feels medication is causing altered alertness then do not drive or operate dangerous equipment. ? ?Should be noted that the patient appears to be meeting appropriate use of opioids and response.  Evidenced by improved function and decent pain control without significant side effects and no evidence of overt aberrancy issues.  Upon discussion with the patient today they understand that opioid therapy is optional and they feel that the pain has been refractory to reasonable conservative measures and is significant and affecting quality of life enough to warrant ongoing therapy and wishes to continue opioids.  Refills were provided. ?Drug registry checked ?3 scripts given ?Patient is faithful with her medicines and it does help benefit her and allows her to function better ? ?Patient is losing weight through exercise and dietary measures ? ?Pain management as per standard  guidelines.  She has been on pain medicine for well over 10 years.  Please see documentation above.  She meets all the standard guidelines and it is medically necessary for her to be on pain medicine given that she has failed control of her pain with just anti-inflammatory, gabapentin, home exercises, 2 previous surgeries, physical therapy in the past. ?

## 2021-11-17 ENCOUNTER — Ambulatory Visit: Payer: Medicaid Other | Admitting: Family Medicine

## 2021-12-12 ENCOUNTER — Ambulatory Visit: Payer: Medicaid Other | Admitting: Family Medicine

## 2021-12-12 VITALS — BP 123/78 | HR 72 | Temp 98.9°F | Ht 66.0 in | Wt 183.0 lb

## 2021-12-12 DIAGNOSIS — S060X0A Concussion without loss of consciousness, initial encounter: Secondary | ICD-10-CM

## 2021-12-12 MED ORDER — OXYCODONE-ACETAMINOPHEN 5-325 MG PO TABS: ORAL_TABLET | ORAL | 0 refills | Status: DC

## 2021-12-12 NOTE — Patient Instructions (Signed)
Concussion, Adult °A concussion is a brain injury from a hard, direct hit (trauma) to the head or body. This direct hit causes the brain to shake quickly back and forth inside the skull. This can damage brain cells and cause chemical changes in the brain. A concussion may also be known as a mild traumatic brain injury (TBI). °Concussions are usually not life-threatening, but the effects of a concussion can be serious. If you have a concussion, you should be very careful to avoid having a second concussion. °What are the causes? °This condition is caused by: °A direct hit to your head, such as: °Running into another player during a game. °Being hit in a fight. °Hitting your head on a hard surface. °Sudden movement of your body that causes your brain to move back and forth inside the skull, such as in a car crash. °What are the signs or symptoms? °The signs of a concussion can be hard to notice. Early on, they may be missed by you, family members, and health care providers. You may look fine on the outside but may act or feel differently. °Every head injury is different. Symptoms are usually temporary but may last for days, weeks, or even months. Some symptoms appear right away, but other symptoms may not show up for hours or days. If your symptoms last longer than normal, you may have post-concussion syndrome. °Physical symptoms °Headaches. °Dizziness and problems with coordination or balance. °Sensitivity to light or noise. °Nausea or vomiting. °Tiredness (fatigue). °Vision or hearing problems. °Changes in eating or sleeping patterns. °Seizure. °Mental and emotional symptoms °Irritability or mood changes. °Memory problems. °Trouble concentrating, organizing, or making decisions. °Slowness in thinking, acting or reacting, speaking, or reading. °Anxiety or depression. °How is this diagnosed? °This condition is diagnosed based on: °Your symptoms. °A description of your injury. °You may also have tests,  including: °Imaging tests, such as a CT scan or an MRI. °Neuropsychological tests. These measure your thinking, understanding, learning, and remembering abilities. °How is this treated? °Treatment for this condition includes: °Stopping sports or activity if you are injured. If you hit your head or show signs of concussion: °Do not return to sports or activities the same day. °Get checked by a health care provider before you return to your activities. °Physical and mental rest and careful observation, usually at home. Gradually return to your normal activities. °Medicines to help with symptoms such as headaches, nausea, or difficulty sleeping. °Avoid taking opioid pain medicine while recovering from a concussion. °Avoiding alcohol and drugs. These may slow your recovery and can put you at risk of further injury. °Referral to a concussion clinic or rehabilitation center. °Recovery from a concussion can take time. How fast you recover depends on many factors. Return to activities only when: °Your symptoms are completely gone. °Your health care provider says that it is safe. °Follow these instructions at home: °Activity °Limit activities that require a lot of thought or concentration, such as: °Doing homework or job-related work. °Watching TV. °Working on the computer or phone. °Playing memory games and puzzles. °Rest. Rest helps your brain heal. Make sure you: °Get plenty of sleep. Most adults should get 7-9 hours of sleep each night. °Rest during the day. Take naps or rest breaks when you feel tired. °Avoid physical activity like exercise until your health care provider says it is safe. Stop any activity that worsens symptoms. °Do not do high-risk activities that could cause a second concussion, such as riding a bike or playing sports. °  Ask your health care provider when you can return to your normal activities, such as school, work, athletics, and driving. Your ability to react may be slower after a brain injury.  Never do these activities if you are dizzy. Your health care provider will likely give you a plan for gradually returning to activities. °General instructions ° °Take over-the-counter and prescription medicines only as told by your health care provider. Some medicines, such as blood thinners (anticoagulants) and aspirin, may increase the risk for complications, such as bleeding. °Do not drink alcohol until your health care provider says you can. °Watch your symptoms and tell others around you to do the same. Complications sometimes occur after a concussion. Older adults with a brain injury may have a higher risk of serious complications. °Tell your work manager, teachers, school nurse, school counselor, coach, or athletic trainer about your injury, symptoms, and restrictions. °Keep all follow-up visits as told by your health care provider. This is important. °How is this prevented? °Avoiding another brain injury is very important. In rare cases, another injury can lead to permanent brain damage, brain swelling, or death. The risk of this is greatest during the first 7-10 days after a head injury. Avoid injuries by: °Stopping activities that could lead to a second concussion, such as contact or recreational sports, until your health care provider says it is okay. °Taking these actions once you have returned to sports or activities: °Avoiding plays or moves that can cause you to crash into another person. This is how most concussions occur. °Following the rules and being respectful of other players. Do not engage in violent or illegal plays. °Getting regular exercise that includes strength and balance training. °Wearing a properly fitting helmet during sports, biking, or other activities. Helmets can help protect you from serious skull and brain injuries, but they may not protect you from a concussion. Even when wearing a helmet, you should avoid being hit in the head. °Contact a health care provider if: °Your  symptoms do not improve. °You have new symptoms. °You have another injury. °Get help right away if: °You have new or worsening physical symptoms, such as: °A severe or worsening headache. °Weakness or numbness in any part of your body, slurred speech, vision changes, or confusion. °Your coordination gets worse. °Vomiting repeatedly. °You have a seizure. °You have unusual behavior changes. °You lose consciousness, are sleepier than normal, or are difficult to wake up. °These symptoms may represent a serious problem that is an emergency. Do not wait to see if the symptoms will go away. Get medical help right away. Call your local emergency services (911 in the [place] Do not drive yourself to the hospital. °Summary °A concussion is a brain injury that results from a hard, direct hit (trauma) to your head or body. °You may have imaging tests and neuropsychological tests to diagnose a concussion. °Treatment for this condition includes physical and mental rest and careful observation. °Ask your health care provider when you can return to your normal activities, such as school, work, athletics, and driving. °Get help right away if you have a severe headache, weakness in any part of the body, seizures, behavior changes, changes in vision, or if you are confused or sleepier than normal. °This information is not intended to replace advice given to you by your health care provider. Make sure you discuss any questions you have with your health care provider. °Document Revised: 11/04/2020 Document Reviewed: 11/04/2020 °Elsevier Patient Education © 2022 [employer] ° °

## 2021-12-12 NOTE — Progress Notes (Signed)
? ?  Subjective:  ? ? Patient ID: Pamela Mendez, female    DOB: 11-03-1965, 56 y.o.   MRN: [id number] ? ?HPI ?Fell down ramp at home yesterday and hit left side of her head on side railing ?Has a bump on that area of the head causing soreness today worse than yesterday ?Significant headache on the left side of her head ?Relates no nausea or vomiting ?No double vision ?No difficulty thinking or processing ?Headache relatively persistent ?Able to sleep ?It did not knock her out ? ? ? ?Review of Systems ? ?   ?Objective:  ? Physical Exam ?Finger-to-nose normal pupils responsive to light tender hematoma left side of the scalp near the vertex ?No deformity noted ?Patient can walk without difficulty with balance ?Strength bilateral normal ? ? ? ?   ?Assessment & Plan:  ?Concussion ?- No need for any type of intervention currently ?Do not recommend any type of MRI or CT currently ?Warning signs were discussed in detail ?No strenuous exercise or workout for now ?Follow-up within 2 weeks ?If progressive troubles or worse we will do scan ?No need to do scan currently ? ? ?

## 2021-12-26 ENCOUNTER — Ambulatory Visit: Payer: Medicaid Other | Admitting: Family Medicine

## 2021-12-26 ENCOUNTER — Encounter: Payer: Self-pay | Admitting: Family Medicine

## 2021-12-27 ENCOUNTER — Telehealth: Payer: Self-pay

## 2021-12-27 NOTE — Telephone Encounter (Signed)
Patient called to verify that she is supposed to be on lisinopril 5 mg instead fo lisinopril 10 mg, states was unaware of change, please confirm dosage.  ?

## 2021-12-27 NOTE — Telephone Encounter (Signed)
Patient notified and stated she has a follow up appointment 01/02/22 to recheck blood pressure and will discuss further at office visit- will stick with lisinopril 5mg  ?

## 2021-12-27 NOTE — Telephone Encounter (Signed)
It was my understanding that we reduced the medication because her weight had come down compared to where it was previously.  Does she have the ability to monitor her blood pressure at home? ?

## 2021-12-28 ENCOUNTER — Encounter: Payer: Self-pay | Admitting: Family Medicine

## 2021-12-28 ENCOUNTER — Ambulatory Visit: Payer: Medicaid Other | Admitting: Family Medicine

## 2021-12-28 VITALS — BP 106/70 | HR 88 | Temp 98.6°F | Wt 186.6 lb

## 2021-12-28 DIAGNOSIS — J019 Acute sinusitis, unspecified: Secondary | ICD-10-CM | POA: Diagnosis not present

## 2021-12-28 DIAGNOSIS — B9689 Other specified bacterial agents as the cause of diseases classified elsewhere: Secondary | ICD-10-CM | POA: Diagnosis not present

## 2021-12-28 MED ORDER — DOXYCYCLINE HYCLATE 100 MG PO TABS: 100.0000 mg | ORAL_TABLET | Freq: Two times a day (BID) | ORAL | 0 refills | Status: DC

## 2021-12-28 NOTE — Telephone Encounter (Signed)
Patient has follow-up office visit today 

## 2021-12-28 NOTE — Progress Notes (Signed)
? ?  Subjective:  ? ? Patient ID: Pamela Mendez, female    DOB: 05/29/1966, 56 y.o.   MRN: [id number] ? ?HPI ?Pt began having sinus issues late yesterday evening. Pt having stuffy nose, pressure behind eyes, headache due to pressure behind eyes. Pt has been utilizing [employer].  This morning.  Patient called in ?Significant head congestion sinus pressure denies wheezing difficulty breathing ? ?Review of Systems ? ?   ?Objective:  ? Physical Exam ?Gen-NAD not toxic ?TMS-normal bilateral ?T- normal no redness ?Chest-CTA respiratory rate normal no crackles ?CV RRR no murmur ?Skin-warm dry ?Neuro-grossly normal ? ?Tender maxillary sinuses ? ? ?   ?Assessment & Plan:  ? ?Doxycycline, sunlight precaution, should gradually get better over the next 7 to 10 days follow-up if progressive troubles continue allergy medicine ?

## 2022-01-02 ENCOUNTER — Ambulatory Visit: Payer: Medicaid Other | Admitting: Family Medicine

## 2022-01-13 ENCOUNTER — Encounter: Payer: Self-pay | Admitting: Family Medicine

## 2022-01-13 ENCOUNTER — Ambulatory Visit: Payer: Medicaid Other | Admitting: Family Medicine

## 2022-01-13 ENCOUNTER — Ambulatory Visit (HOSPITAL_COMMUNITY)
Admission: RE | Admit: 2022-01-13 | Discharge: 2022-01-13 | Disposition: A | Discharge status: Home / Self Care | Payer: Medicaid Other | Source: Ambulatory Visit | Attending: Family Medicine | Admitting: Family Medicine

## 2022-01-13 VITALS — BP 124/79 | HR 73 | Temp 97.7°F | Wt 185.6 lb

## 2022-01-13 DIAGNOSIS — J209 Acute bronchitis, unspecified: Secondary | ICD-10-CM

## 2022-01-13 DIAGNOSIS — R059 Cough, unspecified: Secondary | ICD-10-CM

## 2022-01-13 MED ORDER — PROMETHAZINE-DM 6.25-15 MG/5ML PO SYRP: 5.0000 mL | ORAL_SOLUTION | Freq: Four times a day (QID) | ORAL | 0 refills | Status: DC | PRN

## 2022-01-13 MED ORDER — AZITHROMYCIN 250 MG PO TABS: ORAL_TABLET | ORAL | 0 refills | Status: DC

## 2022-01-13 NOTE — Progress Notes (Signed)
? ?Subjective:  ?Patient ID: Pamela Mendez, female    DOB: 07/30/1966  Age: 56 y.o. MRN: [id number] ? ?CC: ?Chief Complaint  ?Patient presents with  ? Cough  ?  Cough and congestion. Productive cough at time, rattle with coughing per husband. Saw Pamela Mendez a few weeks ago for sinus infection.   ? ? ?HPI: ? ?56 year old female presents for evaluation of the above. ? ?Patient recently seen on 4/26 for respiratory symptoms.  Diagnosed with sinusitis.  Treated with doxycycline.  Patient reports that her upper respiratory symptoms have resolved but she has experienced ongoing cough.  Associated congestion.  Cough is quite severe and continues to persist.  Has a remote history of cardiomyopathy.  No relieving factors.  No fever.  Cough is productive at times. ? ?Patient Active Problem List  ? Diagnosis Date Noted  ? Acute bronchitis 01/13/2022  ? Hyperlipidemia associated with type 2 diabetes mellitus (HCC) 09/23/2020  ? Chronic pain syndrome 03/25/2019  ? Encounter for long-term opiate analgesic use 03/03/2019  ? Prediabetes 03/03/2019  ? HTN (hypertension) 12/08/2016  ? Migraine headache without aura 03/31/2015  ? GERD (gastroesophageal reflux disease) 02/04/2015  ? Chronic back pain 05/13/2013  ? Allergic rhinitis 12/31/2012  ? Hyperlipidemia 01/17/2010  ? CARDIOMYOPATHY, MILD 01/17/2010  ? ? ?Social Hx   ?Social History  ? ?Socioeconomic History  ? Marital status: Single  ?  Spouse name: Not on file  ? Number of children: Not on file  ? Years of education: Not on file  ? Highest education level: Not on file  ?Occupational History  ? Not on file  ?Tobacco Use  ? Smoking status: Former  ?  Packs/day: 0.00  ?  Years: 15.00  ?  Pack years: 0.00  ?  Types: Cigarettes  ?  Quit date: 08/23/2013  ?  Years since quitting: 8.3  ? Smokeless tobacco: Never  ?Vaping Use  ? Vaping Use: Never used  ?Substance and Sexual Activity  ? Alcohol use: No  ? Drug use: No  ? Sexual activity: Yes  ?  Birth control/protection: Surgical  ?   Comment: ablation  ?Other Topics Concern  ? Not on file  ?Social History Narrative  ? Regular exercise: No  ? ?Social Determinants of Health  ? ?Financial Resource Strain: Not on file  ?Food Insecurity: Not on file  ?Transportation Needs: Not on file  ?Physical Activity: Not on file  ?Stress: Not on file  ?Social Connections: Not on file  ? ? ?Review of Systems ?Per HPI ? ?Objective:  ?BP 124/79   Pulse 73   Temp 97.7 ?F (36.5 ?C)   Wt 185 lb 9.6 oz (84.2 kg)   SpO2 97%   BMI 29.96 kg/m?  ? ? ?  01/13/2022  ? 10:27 AM 12/28/2021  ?  3:50 PM 12/28/2021  ?  3:37 PM  ?BP/Weight  ?Systolic BP 124 106 130  ?Diastolic BP 79 70 84  ?Wt. (Lbs) 185.6  186.6  ?BMI 29.96 kg/m2  30.12 kg/m2  ? ? ?Physical Exam ?Vitals and nursing note reviewed.  ?Constitutional:   ?   General: She is not in acute distress. ?   Appearance: Normal appearance. She is not ill-appearing.  ?HENT:  ?   Head: Normocephalic and atraumatic.  ?Eyes:  ?   General:     ?   Right eye: No discharge.     ?   Left eye: No discharge.  ?   Conjunctiva/sclera: Conjunctivae normal.  ?Cardiovascular:  ?  Rate and Rhythm: Normal rate and regular rhythm.  ?   Heart sounds: No murmur heard. ?Pulmonary:  ?   Effort: Pulmonary effort is normal.  ?   Breath sounds: Normal breath sounds. No wheezing, rhonchi or rales.  ?Neurological:  ?   Mental Status: She is alert.  ?Psychiatric:     ?   Mood and Affect: Mood normal.     ?   Behavior: Behavior normal.  ? ? ?Lab Results  ?Component Value Date  ? WBC 9.7 06/08/2018  ? HGB 15.5 (H) 06/08/2018  ? HCT 46.4 (H) 06/08/2018  ? PLT 232 06/08/2018  ? GLUCOSE 119 (H) 08/16/2021  ? CHOL 164 08/16/2021  ? TRIG 135 08/16/2021  ? HDL 50 08/16/2021  ? LDLCALC 90 08/16/2021  ? ALT 26 02/16/2021  ? AST 19 02/16/2021  ? NA 141 08/16/2021  ? K 4.3 08/16/2021  ? CL 104 08/16/2021  ? CREATININE 0.87 08/16/2021  ? BUN 10 08/16/2021  ? CO2 23 08/16/2021  ? TSH 1.090 10/26/2017  ? INR 0.97 09/06/2009  ? HGBA1C 6.2 (H) 08/16/2021   ? ? ? ?Assessment & Plan:  ? ?Problem List Items Addressed This Visit   ? ?  ? Respiratory  ? Acute bronchitis - Primary  ?  Given duration of cough, chest x-ray was obtained today.  Chest x-ray independently reviewed by me.  Interpretation: Normal chest x-ray.  No evidence of infiltrate.  Treating with azithromycin and Promethazine DM. ? ?  ?  ? ?Other Visit Diagnoses   ? ? Cough, unspecified type      ? Relevant Orders  ? DG Chest 2 View (Completed)  ? ?  ? ? ?Meds ordered this encounter  ?Medications  ? azithromycin (ZITHROMAX) 250 MG tablet  ?  Sig: 2 tablets on day 1, then 1 tablet daily on days 2-5.  ?  Dispense:  6 tablet  ?  Refill:  0  ? promethazine-dextromethorphan (PROMETHAZINE-DM) 6.25-15 MG/5ML syrup  ?  Sig: Take 5 mLs by mouth 4 (four) times daily as needed for cough.  ?  Dispense:  118 mL  ?  Refill:  0  ? ?Everlene Other DO ?[facility] ? ?

## 2022-01-13 NOTE — Assessment & Plan Note (Signed)
Given duration of cough, chest x-ray was obtained today.  Chest x-ray independently reviewed by me.  Interpretation: Normal chest x-ray.  No evidence of infiltrate.  Treating with azithromycin and Promethazine DM. ?

## 2022-01-13 NOTE — Patient Instructions (Signed)
Chest xray today. ? ?We will call results. ? ?Treatment pending the xray. ? ?Take care ? ?Dr. Adriana Simas  ?

## 2022-02-01 ENCOUNTER — Other Ambulatory Visit: Payer: Self-pay | Admitting: Family Medicine

## 2022-02-17 DIAGNOSIS — E785 Hyperlipidemia, unspecified: Secondary | ICD-10-CM | POA: Diagnosis not present

## 2022-02-17 DIAGNOSIS — I10 Essential (primary) hypertension: Secondary | ICD-10-CM | POA: Diagnosis not present

## 2022-02-17 DIAGNOSIS — J0181 Other acute recurrent sinusitis: Secondary | ICD-10-CM | POA: Diagnosis not present

## 2022-02-17 DIAGNOSIS — E1169 Type 2 diabetes mellitus with other specified complication: Secondary | ICD-10-CM | POA: Diagnosis not present

## 2022-02-17 DIAGNOSIS — E119 Type 2 diabetes mellitus without complications: Secondary | ICD-10-CM | POA: Diagnosis not present

## 2022-02-18 LAB — BASIC METABOLIC PANEL
BUN/Creatinine Ratio: 21 (ref 9–23)
BUN: 18 mg/dL (ref 6–24)
CO2: 22 mmol/L (ref 20–29)
Calcium: 9.3 mg/dL (ref 8.7–10.2)
Chloride: 104 mmol/L (ref 96–106)
Creatinine, Ser: 0.85 mg/dL (ref 0.57–1.00)
Glucose: 115 mg/dL — ABNORMAL HIGH (ref 70–99)
Potassium: 4.3 mmol/L (ref 3.5–5.2)
Sodium: 143 mmol/L (ref 134–144)
eGFR: 81 mL/min/{1.73_m2} (ref >59)

## 2022-02-18 LAB — LIPID PANEL
Chol/HDL Ratio: 2.8 ratio (ref 0.0–4.4)
Cholesterol, Total: 150 mg/dL (ref 100–199)
HDL: 54 mg/dL (ref >39)
LDL Chol Calc (NIH): 74 mg/dL (ref 0–99)
Triglycerides: 126 mg/dL (ref 0–149)
VLDL Cholesterol Cal: 22 mg/dL (ref 5–40)

## 2022-02-18 LAB — HEMOGLOBIN A1C
Est. average glucose Bld gHb Est-mCnc: 126 mg/dL
Hgb A1c MFr Bld: 6 % — ABNORMAL HIGH (ref 4.8–5.6)

## 2022-02-20 ENCOUNTER — Ambulatory Visit: Payer: Medicaid Other | Admitting: Family Medicine

## 2022-02-20 ENCOUNTER — Encounter: Payer: Self-pay | Admitting: Family Medicine

## 2022-02-20 VITALS — BP 92/70 | HR 66 | Temp 98.0°F | Ht 66.0 in | Wt 187.6 lb

## 2022-02-20 DIAGNOSIS — Z1239 Encounter for other screening for malignant neoplasm of breast: Secondary | ICD-10-CM

## 2022-02-20 DIAGNOSIS — Z1211 Encounter for screening for malignant neoplasm of colon: Secondary | ICD-10-CM

## 2022-02-20 DIAGNOSIS — E1169 Type 2 diabetes mellitus with other specified complication: Secondary | ICD-10-CM | POA: Diagnosis not present

## 2022-02-20 DIAGNOSIS — G894 Chronic pain syndrome: Secondary | ICD-10-CM | POA: Diagnosis not present

## 2022-02-20 DIAGNOSIS — E785 Hyperlipidemia, unspecified: Secondary | ICD-10-CM

## 2022-02-20 MED ORDER — OXYCODONE-ACETAMINOPHEN 5-325 MG PO TABS: ORAL_TABLET | ORAL | 0 refills | Status: DC

## 2022-02-20 NOTE — Progress Notes (Signed)
Subjective:    Patient ID: Pamela Mendez, female    DOB: 1966/08/11, 56 y.o.   MRN: [id number]  HPI  Patient here for follow up on diabetes and pain management. This patient was seen today for chronic pain  The medication list was reviewed and updated.  Location of Pain for which the patient has been treated with regarding narcotics: Pain and discomfort previous surgeries  Onset of this pain: Present for years   -Compliance with medication: Good compliance with medicine  - Number patient states they take daily: Takes 4/day  -when was the last dose patient took?  Earlier today  The patient was advised the importance of maintaining medication and not using illegal substances with these.  Here for refills and follow up  The patient was educated that we can provide 3 monthly scripts for their medication, it is their responsibility to follow the instructions.  Side effects or complications from medications: Denies side effects with medication  Patient is aware that pain medications are meant to minimize the severity of the pain to allow their pain levels to improve to allow for better function. They are aware of that pain medications cannot totally remove their pain.  Due for UDT ( at least once per year) : Last September she is due on her next visit  Scale of 1 to 10 ( 1 is least 10 is most) Your pain level without the medicine: 8 Your pain level with medication 3  Scale 1 to 10 ( 1-helps very little, 10 helps very well) How well does your pain medication reduce your pain so you can function better through out the day? 8  Quality of the pain: Burning throbbing aching  Persistence of the pain: Present all the time  Modifying factors: Worse with activity  The patient was seen today as part of a comprehensive diabetic check up. Patient has diabetes  Compliance-good compliance Low sugars-denies low sugars Dietary effort-watching her diet exercising bring her weight  down Foot exam and ophthalmology exam requirements were reviewed      Review of Systems     Objective:   Physical Exam General-in no acute distress Eyes-no discharge Lungs-respiratory rate normal, CTA CV-no murmurs,RRR Extremities skin warm dry no edema Neuro grossly normal Behavior normal, alert        Assessment & Plan:  1. Hyperlipidemia associated with type 2 diabetes mellitus (HCC) Doing well on cholesterol medicine watch diet continue current measures  2. Chronic pain syndrome The patient was seen in followup for chronic pain. A review over at their current pain status was discussed. Drug registry was checked. Prescriptions were given.  Regular follow-up recommended. Discussion was held regarding the importance of compliance with medication as well as pain medication contract.  Patient was informed that medication may cause drowsiness and should not be combined  with other medications/alcohol or street drugs. If the patient feels medication is causing altered alertness then do not drive or operate dangerous equipment.  Should be noted that the patient appears to be meeting appropriate use of opioids and response.  Evidenced by improved function and decent pain control without significant side effects and no evidence of overt aberrancy issues.  Upon discussion with the patient today they understand that opioid therapy is optional and they feel that the pain has been refractory to reasonable conservative measures and is significant and affecting quality of life enough to warrant ongoing therapy and wishes to continue opioids.  Refills were provided.   3. Screening  for colon cancer Cologuard ordered - Cologuard  4. Encounter for screening for malignant neoplasm of breast, unspecified screening modality Mammo ordered - MM Digital Screening  Blood pressure on the low end stop lisinopril  Patient doing a good job eating healthy and also exercising keeping her weight  down Reduce metformin to half tablet twice daily Follow-up in 3 months urine drug screen that time  Also patient encouraged to get her eyes examined for diabetic purposes  Diabetic foot exam normal

## 2022-02-20 NOTE — Patient Instructions (Signed)

## 2022-03-01 ENCOUNTER — Inpatient Hospital Stay (HOSPITAL_COMMUNITY): Admission: RE | Admit: 2022-03-01 | Payer: Medicaid Other | Source: Ambulatory Visit

## 2022-03-08 DIAGNOSIS — Z1211 Encounter for screening for malignant neoplasm of colon: Secondary | ICD-10-CM | POA: Diagnosis not present

## 2022-03-14 LAB — COLOGUARD: COLOGUARD: NEGATIVE

## 2022-04-03 ENCOUNTER — Ambulatory Visit: Payer: Medicaid Other | Admitting: Nurse Practitioner

## 2022-04-03 ENCOUNTER — Ambulatory Visit (HOSPITAL_COMMUNITY)
Admission: RE | Admit: 2022-04-03 | Discharge: 2022-04-03 | Disposition: A | Discharge status: Home / Self Care | Payer: Medicaid Other | Source: Ambulatory Visit | Attending: Nurse Practitioner | Admitting: Nurse Practitioner

## 2022-04-03 VITALS — BP 106/67 | HR 81 | Ht 66.0 in | Wt 192.0 lb

## 2022-04-03 DIAGNOSIS — L209 Atopic dermatitis, unspecified: Secondary | ICD-10-CM | POA: Diagnosis not present

## 2022-04-03 DIAGNOSIS — M79671 Pain in right foot: Secondary | ICD-10-CM | POA: Insufficient documentation

## 2022-04-03 MED ORDER — DICLOFENAC SODIUM 1 % EX GEL: 2.0000 g | Freq: Four times a day (QID) | CUTANEOUS | 0 refills | Status: DC

## 2022-04-03 MED ORDER — TRIAMCINOLONE ACETONIDE 0.1 % EX CREA: TOPICAL_CREAM | Freq: Two times a day (BID) | CUTANEOUS | 2 refills | Status: AC

## 2022-04-03 NOTE — Progress Notes (Signed)
   Subjective:    Patient ID: Pamela Mendez, female    DOB: 03/21/1966, 56 y.o.   MRN: [id number]  HPI  Patient arrives with right foot pain for a few days. Patient states she has had no know injury.  Review of Systems     Objective:   Physical Exam        Assessment & Plan:

## 2022-04-04 ENCOUNTER — Encounter: Payer: Self-pay | Admitting: Nurse Practitioner

## 2022-04-04 ENCOUNTER — Encounter: Payer: Self-pay | Admitting: Family Medicine

## 2022-04-04 ENCOUNTER — Other Ambulatory Visit: Payer: Self-pay | Admitting: Family Medicine

## 2022-04-04 MED ORDER — OXYCODONE-ACETAMINOPHEN 5-325 MG PO TABS: ORAL_TABLET | ORAL | 0 refills | Status: DC

## 2022-04-05 ENCOUNTER — Other Ambulatory Visit: Payer: Self-pay | Admitting: Family Medicine

## 2022-04-05 MED ORDER — OXYCODONE-ACETAMINOPHEN 5-325 MG PO TABS: ORAL_TABLET | ORAL | 0 refills | Status: DC

## 2022-04-05 NOTE — Telephone Encounter (Signed)
Nurses-which [employer]

## 2022-04-05 NOTE — Telephone Encounter (Signed)
Which [facility]?

## 2022-04-05 NOTE — Telephone Encounter (Signed)
Refills was sent to [facility] on [address]

## 2022-04-22 ENCOUNTER — Encounter: Payer: Self-pay | Admitting: Family Medicine

## 2022-04-28 ENCOUNTER — Ambulatory Visit: Payer: Medicaid Other

## 2022-05-01 ENCOUNTER — Other Ambulatory Visit: Payer: Self-pay | Admitting: Family Medicine

## 2022-05-10 ENCOUNTER — Other Ambulatory Visit: Payer: Self-pay | Admitting: Family Medicine

## 2022-05-10 NOTE — Telephone Encounter (Signed)
Front  I did tell French Ana that if this young individual had any health issues over the next 7 months while she is staying with French Ana that French Ana could call us and we would be happy to see her.  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 French Ana would let us know if the patient needs to be seen thanks-Dr. Lorin Picket

## 2022-05-15 ENCOUNTER — Ambulatory Visit: Payer: Medicaid Other | Admitting: Family Medicine

## 2022-05-15 ENCOUNTER — Encounter: Payer: Self-pay | Admitting: Family Medicine

## 2022-05-15 VITALS — BP 113/76 | HR 58 | Temp 98.2°F | Wt 196.6 lb

## 2022-05-15 DIAGNOSIS — H60502 Unspecified acute noninfective otitis externa, left ear: Secondary | ICD-10-CM

## 2022-05-15 DIAGNOSIS — H9202 Otalgia, left ear: Secondary | ICD-10-CM

## 2022-05-15 DIAGNOSIS — H6122 Impacted cerumen, left ear: Secondary | ICD-10-CM | POA: Diagnosis not present

## 2022-05-15 MED ORDER — NEOMYCIN-POLYMYXIN-HC 3.5-10000-1 OT SOLN: 4.0000 [drp] | Freq: Four times a day (QID) | OTIC | 0 refills | Status: DC

## 2022-05-15 NOTE — Progress Notes (Signed)
   Subjective:    Patient ID: Pamela Mendez, female    DOB: 26-Oct-1965, 56 y.o.   MRN: [id number]  HPI Pt arrives with left ear pain. Pt states ear pain began after irrigation at office a few weeks ago. Pain behind left ear and down neck a little ways. Pt has been using Debrox. Does get headache off and on at times.   teoh Review of Systems     Objective:   Physical Exam Left ear canal with wax tender to the touch some drainage right ear canal normal throat normal lungs clear       Assessment & Plan:  Dr Suszanne Conners Referral to Dr.Teoh ASAP antibiotic eardrops prescribed follow-up if ongoing troubles

## 2022-05-16 ENCOUNTER — Telehealth: Payer: Self-pay

## 2022-05-16 MED ORDER — DOXYCYCLINE HYCLATE 100 MG PO TABS: 100.0000 mg | ORAL_TABLET | Freq: Two times a day (BID) | ORAL | 0 refills | Status: DC

## 2022-05-16 NOTE — Telephone Encounter (Signed)
Wow-please explain situation to patient There is no easy answers More than likely she had a reaction to the polymyxin which is within Cortisporin drops It is also possible that she has secondary infection around the ear canal I would recommend Ciprodex, 4 drops twice daily in the ear for 7 days Also recommend doxycycline 100 mg 1 twice daily for 7 days avoid excessive sun take with a snack and a tall glass of water If situation worsening as the week goes on call us back Can schedule her with myself or Hillary Bow next week for recheck-thanks

## 2022-05-16 NOTE — Telephone Encounter (Signed)
Patient notified. Medication added to allergy list. Prescription sent electronically to pharmacy. Message sent to referral coordinator to see if patient could get a sooner ENT appointment

## 2022-05-16 NOTE — Telephone Encounter (Signed)
If her insurance company will cover the Ciprodex it would also be helpful

## 2022-05-16 NOTE — Telephone Encounter (Signed)
1.  Stop Cortisporin otic 2.  Put this on her allergy list  #3 recommend doxycycline 100 mg 1 twice daily for 7 days needs appointment with Dr.Teoh ASAP-please call Dr.Teoh office and asked them if they can work the patient in somewhere in the next several days that would be great

## 2022-05-16 NOTE — Telephone Encounter (Signed)
Call received from patient and states she has what seems to be an allergic reaction to the ear drops prescribed yesterday . States her ear started swelling shut last night after using it, please advise.

## 2022-05-18 NOTE — Telephone Encounter (Signed)
Based upon potential for allergic reaction to Ciprodex I would not recommend Ciprodex.  If her ear is not improving any by early next week please let me know we can recheck it  Also to the patient can call ENT to get on a cancellation list. Patient has standard follow-up office visit [phone]

## 2022-05-18 NOTE — Telephone Encounter (Signed)
Left message to return call 

## 2022-05-19 ENCOUNTER — Ambulatory Visit: Payer: Medicaid Other | Admitting: Family Medicine

## 2022-05-19 ENCOUNTER — Encounter: Payer: Self-pay | Admitting: Family Medicine

## 2022-05-19 VITALS — BP 120/78 | Wt 195.4 lb

## 2022-05-19 DIAGNOSIS — H60502 Unspecified acute noninfective otitis externa, left ear: Secondary | ICD-10-CM | POA: Diagnosis not present

## 2022-05-19 MED ORDER — DOXYCYCLINE HYCLATE 100 MG PO TABS
100.0000 mg | ORAL_TABLET | Freq: Two times a day (BID) | ORAL | 0 refills | Status: DC
Start: 2022-05-19 — End: 2022-05-30

## 2022-05-19 NOTE — Progress Notes (Signed)
   Subjective:    Patient ID: Pamela Mendez, female    DOB: 1966/06/15, 56 y.o.   MRN: [id number]  HPI Pt arrives to have recheck on left ear. Pt states ear is still tender to touch. Slept better last night but did have some pain prior to. Has been taking Doxy and using Debrox OTC; also tried Excedrin. Pt states that script did not have sulfate in it but [facility] filled ear drops with sulfate. Pt has ENT appt November 1 but is on cancellation list.    Review of Systems     Objective:   Physical Exam  Right ear canal minimal wax left ear canal with some wax irritation of the ear canal patient relates severe pain in the ear she states the swelling is better compared to where it was  Should be noted that she had significant reaction to Cortisporin otic and as a result she states she had significant swelling in the ear canal      Assessment & Plan:   Cerumen impaction with left ear pain and discomfort hold off on Ciprodex drops for right now  May continue Doxy We will try to get her in with ENT sooner than the current November appointment

## 2022-05-19 NOTE — Telephone Encounter (Signed)
Patient has appointment 05/19/22 at 11:40am with Dr Lorin Picket

## 2022-05-21 ENCOUNTER — Telehealth: Payer: Self-pay | Admitting: Family Medicine

## 2022-05-21 NOTE — Telephone Encounter (Signed)
Will call ENT office to try to get her appt sooner

## 2022-05-24 ENCOUNTER — Ambulatory Visit: Payer: Medicaid Other | Admitting: Family Medicine

## 2022-05-24 NOTE — Telephone Encounter (Signed)
Courtney-if possible please see if ENT that she was referred to could potentially work her in sooner patient complaining of worsening ear pain I have seen her twice and then tried different medicines it appears to be more of a otitis externa with localized pain but unfortunately the patient has been allergic to the eardrops so we had to utilize oral medicines.  I am hopeful that one of their nurse practitioners or PAs could see her within the next 2 weeks that would be greatly appreciated If I need to speak with provider I can do so but I will not be available to do so until Monday-I am out of the office this afternoon Thursday and Friday thank you

## 2022-05-25 ENCOUNTER — Encounter: Payer: Self-pay | Admitting: Family Medicine

## 2022-05-25 ENCOUNTER — Telehealth: Payer: Self-pay | Admitting: Family Medicine

## 2022-05-25 NOTE — Telephone Encounter (Signed)
Pt appt has been moved up to tomorrow for ENT

## 2022-05-25 NOTE — Telephone Encounter (Signed)
Nurses Patient sent a MyChart message regarding her ear continuing to drain I called [facility] They were able to move up her appointment Instead of November 2 it is tomorrow morning at 9:15 AM that is the only other option.  It is with the PA.  She needs to be there by 8:45 AM.  I sent her a MyChart message.  Please also alert Loma Sousa so she is aware of this thank you

## 2022-05-26 DIAGNOSIS — J352 Hypertrophy of adenoids: Secondary | ICD-10-CM | POA: Insufficient documentation

## 2022-05-26 DIAGNOSIS — H6521 Chronic serous otitis media, right ear: Secondary | ICD-10-CM | POA: Diagnosis not present

## 2022-05-26 DIAGNOSIS — H7192 Unspecified cholesteatoma, left ear: Secondary | ICD-10-CM | POA: Diagnosis not present

## 2022-05-26 DIAGNOSIS — H9193 Unspecified hearing loss, bilateral: Secondary | ICD-10-CM | POA: Insufficient documentation

## 2022-05-26 DIAGNOSIS — H65491 Other chronic nonsuppurative otitis media, right ear: Secondary | ICD-10-CM | POA: Insufficient documentation

## 2022-05-29 ENCOUNTER — Other Ambulatory Visit: Payer: Self-pay | Admitting: Physician Assistant

## 2022-05-29 DIAGNOSIS — H7192 Unspecified cholesteatoma, left ear: Secondary | ICD-10-CM

## 2022-05-29 DIAGNOSIS — J352 Hypertrophy of adenoids: Secondary | ICD-10-CM

## 2022-05-29 DIAGNOSIS — H65491 Other chronic nonsuppurative otitis media, right ear: Secondary | ICD-10-CM

## 2022-05-29 DIAGNOSIS — H9193 Unspecified hearing loss, bilateral: Secondary | ICD-10-CM

## 2022-05-30 ENCOUNTER — Ambulatory Visit: Payer: Medicaid Other | Admitting: Family Medicine

## 2022-05-30 ENCOUNTER — Encounter: Payer: Self-pay | Admitting: Family Medicine

## 2022-05-30 VITALS — BP 128/78 | Wt 198.2 lb

## 2022-05-30 DIAGNOSIS — Z23 Encounter for immunization: Secondary | ICD-10-CM

## 2022-05-30 DIAGNOSIS — Z1231 Encounter for screening mammogram for malignant neoplasm of breast: Secondary | ICD-10-CM

## 2022-05-30 DIAGNOSIS — E7849 Other hyperlipidemia: Secondary | ICD-10-CM | POA: Diagnosis not present

## 2022-05-30 DIAGNOSIS — G894 Chronic pain syndrome: Secondary | ICD-10-CM | POA: Diagnosis not present

## 2022-05-30 DIAGNOSIS — E1169 Type 2 diabetes mellitus with other specified complication: Secondary | ICD-10-CM | POA: Diagnosis not present

## 2022-05-30 DIAGNOSIS — I10 Essential (primary) hypertension: Secondary | ICD-10-CM | POA: Diagnosis not present

## 2022-05-30 DIAGNOSIS — E119 Type 2 diabetes mellitus without complications: Secondary | ICD-10-CM | POA: Diagnosis not present

## 2022-05-30 DIAGNOSIS — Z79899 Other long term (current) drug therapy: Secondary | ICD-10-CM | POA: Diagnosis not present

## 2022-05-30 DIAGNOSIS — Z79891 Long term (current) use of opiate analgesic: Secondary | ICD-10-CM | POA: Diagnosis not present

## 2022-05-30 DIAGNOSIS — E785 Hyperlipidemia, unspecified: Secondary | ICD-10-CM | POA: Diagnosis not present

## 2022-05-30 MED ORDER — PANTOPRAZOLE SODIUM 40 MG PO TBEC: 40.0000 mg | DELAYED_RELEASE_TABLET | Freq: Every day | ORAL | 1 refills | Status: DC

## 2022-05-30 MED ORDER — OXYCODONE-ACETAMINOPHEN 5-325 MG PO TABS: ORAL_TABLET | ORAL | 0 refills | Status: DC

## 2022-05-30 MED ORDER — METFORMIN HCL 500 MG PO TABS: 500.0000 mg | ORAL_TABLET | Freq: Two times a day (BID) | ORAL | 1 refills | Status: DC

## 2022-05-30 NOTE — Progress Notes (Signed)
Subjective:    Patient ID: Pamela Mendez, female    DOB: 03-02-1966, 56 y.o.   MRN: [id number]  HPI This patient was seen today for chronic pain  The medication list was reviewed and updated.  Location of Pain for which the patient has been treated with regarding narcotics:   Onset of this pain:    -Compliance with medication: Oxycodone/Acetaminophen 5-325 mg  - Number patient states they take daily: 4  -when was the last dose patient took? Yesterday about 9:30 pm  The patient was advised the importance of maintaining medication and not using illegal substances with these.  Here for refills and follow up  The patient was educated that we can provide 3 monthly scripts for their medication, it is their responsibility to follow the instructions.  Side effects or complications from medications: none  Patient is aware that pain medications are meant to minimize the severity of the pain to allow their pain levels to improve to allow for better function. They are aware of that pain medications cannot totally remove their pain.  Due for UDT ( at least once per year) : completed today  Scale of 1 to 10 ( 1 is least 10 is most) Your pain level without the medicine: 9 Your pain level with medication: 3-4  Scale 1 to 10 ( 1-helps very little, 10 helps very well) How well does your pain medication reduce your pain so you can function better through out the day? 10  Quality of the pain:   Persistence of the pain:   Modifying factors:         Review of Systems     Objective:   Physical Exam General-in no acute distress Eyes-no discharge Lungs-respiratory rate normal, CTA CV-no murmurs,RRR Extremities skin warm dry no edema Neuro grossly normal Behavior normal, alert        Assessment & Plan:  1. Encounter for long-term opiate analgesic use The patient was seen in followup for chronic pain. A review over at their current pain status was discussed. Drug registry  was checked. Prescriptions were given.  Regular follow-up recommended. Discussion was held regarding the importance of compliance with medication as well as pain medication contract.  Patient was informed that medication may cause drowsiness and should not be combined  with other medications/alcohol or street drugs. If the patient feels medication is causing altered alertness then do not drive or operate dangerous equipment.  Should be noted that the patient appears to be meeting appropriate use of opioids and response.  Evidenced by improved function and decent pain control without significant side effects and no evidence of overt aberrancy issues.  Upon discussion with the patient today they understand that opioid therapy is optional and they feel that the pain has been refractory to reasonable conservative measures and is significant and affecting quality of life enough to warrant ongoing therapy and wishes to continue opioids.  Refills were provided.  - ToxASSURE Select 13 (MW), Urine  2. Primary hypertension Blood pressure good control continue current measures - Hemoglobin A1c - Microalbumin/Creatinine Ratio, Urine - Lipid panel - Hepatic function panel - Basic metabolic panel  3. Other hyperlipidemia Dietary continue current measures check labs before next visit - Hemoglobin A1c - Microalbumin/Creatinine Ratio, Urine - Lipid panel - Hepatic function panel - Basic metabolic panel  4. Hyperlipidemia associated with type 2 diabetes mellitus ([place]) Portion control healthy diet check labs before next visit - Hemoglobin A1c - Microalbumin/Creatinine Ratio, Urine - Lipid panel -  Hepatic function panel - Basic metabolic panel  5. Chronic pain syndrome Pain meds see per above patient keeps them in the same spot patient does benefit from pain medicine  6. Need for vaccination Flu shot today - Flu Vaccine QUAD 6+ mos PF IM (Fluarix Quad PF)  7. Encounter for screening mammogram  for malignant neoplasm of breast Mammo today - MM 3D SCREEN BREAST BILATERAL  8. Diabetes mellitus without complication ([place]) Check A1c before next visit  9. High risk medication use Labs ordered - Hemoglobin A1c - Microalbumin/Creatinine Ratio, Urine - Lipid panel - Hepatic function panel - Basic metabolic panel

## 2022-06-01 ENCOUNTER — Other Ambulatory Visit: Payer: Self-pay | Admitting: Family Medicine

## 2022-06-01 ENCOUNTER — Encounter: Payer: Self-pay | Admitting: Family Medicine

## 2022-06-01 MED ORDER — OXYCODONE-ACETAMINOPHEN 5-325 MG PO TABS: ORAL_TABLET | ORAL | 0 refills | Status: DC

## 2022-06-05 ENCOUNTER — Ambulatory Visit
Admission: RE | Admit: 2022-06-05 | Discharge: 2022-06-05 | Disposition: A | Discharge status: Home / Self Care | Payer: Medicaid Other | Source: Ambulatory Visit | Attending: Physician Assistant | Admitting: Physician Assistant

## 2022-06-05 DIAGNOSIS — J352 Hypertrophy of adenoids: Secondary | ICD-10-CM

## 2022-06-05 DIAGNOSIS — H7192 Unspecified cholesteatoma, left ear: Secondary | ICD-10-CM

## 2022-06-05 DIAGNOSIS — H9193 Unspecified hearing loss, bilateral: Secondary | ICD-10-CM

## 2022-06-05 DIAGNOSIS — H65491 Other chronic nonsuppurative otitis media, right ear: Secondary | ICD-10-CM

## 2022-06-05 DIAGNOSIS — H748X3 Other specified disorders of middle ear and mastoid, bilateral: Secondary | ICD-10-CM | POA: Diagnosis not present

## 2022-06-05 DIAGNOSIS — H6690 Otitis media, unspecified, unspecified ear: Secondary | ICD-10-CM | POA: Diagnosis not present

## 2022-06-06 ENCOUNTER — Other Ambulatory Visit: Payer: Self-pay | Admitting: Family Medicine

## 2022-06-07 ENCOUNTER — Ambulatory Visit (HOSPITAL_COMMUNITY): Payer: Medicaid Other

## 2022-06-14 ENCOUNTER — Ambulatory Visit (HOSPITAL_COMMUNITY)
Admission: RE | Admit: 2022-06-14 | Discharge: 2022-06-14 | Disposition: A | Discharge status: Home / Self Care | Payer: Medicaid Other | Source: Ambulatory Visit | Attending: Family Medicine | Admitting: Family Medicine

## 2022-06-14 DIAGNOSIS — Z1231 Encounter for screening mammogram for malignant neoplasm of breast: Secondary | ICD-10-CM | POA: Diagnosis not present

## 2022-06-29 ENCOUNTER — Other Ambulatory Visit: Payer: Self-pay | Admitting: Family Medicine

## 2022-07-03 ENCOUNTER — Ambulatory Visit: Payer: Medicaid Other | Admitting: Family Medicine

## 2022-07-03 VITALS — BP 131/84 | HR 85 | Temp 97.9°F | Wt 201.0 lb

## 2022-07-03 DIAGNOSIS — J029 Acute pharyngitis, unspecified: Secondary | ICD-10-CM | POA: Diagnosis not present

## 2022-07-03 DIAGNOSIS — J020 Streptococcal pharyngitis: Secondary | ICD-10-CM

## 2022-07-03 LAB — POCT RAPID STREP A (OFFICE): Rapid Strep A Screen: POSITIVE — AB

## 2022-07-03 MED ORDER — AZITHROMYCIN 250 MG PO TABS: ORAL_TABLET | ORAL | 0 refills | Status: DC

## 2022-07-03 NOTE — Progress Notes (Signed)
   Subjective:    Patient ID: Pamela Mendez, female    DOB: 08-Jan-1966, 56 y.o.   MRN: [id number]  HPI  Sore throat , laryngitis, swollen lymphs, fever on and off  , fever 102 took excedrin  Pain discomfort fever chills no shortness of breath or wheezing Review of Systems     Objective:   Physical Exam  Gen-NAD not toxic TMS-normal bilateral T-exudative pharyngitis no sign of abscess Chest-CTA respiratory rate normal no crackles CV RRR no murmur Skin-warm dry Neuro-grossly normal       Assessment & Plan:  Warnings regarding abscess was discussed Rapid strep positive treatment with antibiotics  Strep positive Strep throat Antibiotics Warning signs discussed

## 2022-07-07 ENCOUNTER — Encounter: Payer: Self-pay | Admitting: Family Medicine

## 2022-07-18 ENCOUNTER — Telehealth: Payer: Self-pay

## 2022-07-18 NOTE — Telephone Encounter (Signed)
..   Medicaid Managed Care Note  07/18/2022 Name: [name] MRN: [id number] DOB: [birth year]  Pamela Mendez is a 56 y.o. year old female who is a primary care patient of Luking, Jonna Coup, MD and is actively engaged with the care management team. I reached out to Pamela Mendez by phone today to assist with scheduling an initial visit with the RN Case Manager  Follow up plan: Unsuccessful telephone outreach attempt made. A HIPAA compliant phone message was left for the patient providing contact information and requesting a return call.  The care management team will reach out to the patient again over the next 14 days.   Weston Settle Care Guide, High Risk Medicaid Managed Care Embedded Care Coordination [facility]  [facility]

## 2022-08-24 ENCOUNTER — Ambulatory Visit: Payer: Medicaid Other | Admitting: Family Medicine

## 2022-09-07 ENCOUNTER — Ambulatory Visit: Payer: Medicaid Other | Admitting: Family Medicine

## 2022-09-07 VITALS — BP 136/86 | HR 84 | Temp 98.8°F | Wt 202.2 lb

## 2022-09-07 DIAGNOSIS — G8929 Other chronic pain: Secondary | ICD-10-CM

## 2022-09-07 DIAGNOSIS — M5440 Lumbago with sciatica, unspecified side: Secondary | ICD-10-CM | POA: Diagnosis not present

## 2022-09-07 DIAGNOSIS — K219 Gastro-esophageal reflux disease without esophagitis: Secondary | ICD-10-CM | POA: Diagnosis not present

## 2022-09-07 DIAGNOSIS — E785 Hyperlipidemia, unspecified: Secondary | ICD-10-CM

## 2022-09-07 DIAGNOSIS — E1169 Type 2 diabetes mellitus with other specified complication: Secondary | ICD-10-CM

## 2022-09-07 DIAGNOSIS — E7849 Other hyperlipidemia: Secondary | ICD-10-CM

## 2022-09-07 DIAGNOSIS — Z79891 Long term (current) use of opiate analgesic: Secondary | ICD-10-CM | POA: Diagnosis not present

## 2022-09-07 MED ORDER — OXYCODONE-ACETAMINOPHEN 5-325 MG PO TABS: ORAL_TABLET | ORAL | 0 refills | Status: DC

## 2022-09-07 NOTE — Patient Instructions (Signed)

## 2022-09-07 NOTE — Progress Notes (Signed)
This patient was seen today for chronic pain  The medication list was reviewed and updated.  Location of Pain for which the patient has been treated with regarding narcotics: Back  Onset of this pain: years   -Compliance with medication: yes  - Number patient states they take daily: 4 x daily  -when was the last dose patient took? 3:30 am this morning  The patient was advised the importance of maintaining medication and not using illegal substances with these.  Here for refills and follow up  The patient was educated that we can provide 3 monthly scripts for their medication, it is their responsibility to follow the instructions.  Side effects or complications from medications: no  Patient is aware that pain medications are meant to minimize the severity of the pain to allow their pain levels to improve to allow for better function. They are aware of that pain medications cannot totally remove their pain.  Due for UDT ( at least once per year) : She is up-to-date on this she is doing it today  Scale of 1 to 10 ( 1 is least 10 is most) Your pain level without the medicine: 10 Your pain level with medication 7  Scale 1 to 10 ( 1-helps very little, 10 helps very well) How well does your pain medication reduce your pain so you can function better through out the day? 7 to 8  Quality of the pain: throbbing pain  Persistence of the pain: constant  Modifying factors: same She is having significant pain in her mid lower back radiates into the right leg as well as some into the left leg denies weakness she feels that it is related into the colder weather causing her a lot of problems.  She denies any setbacks or issues currently      Subjective:    Patient ID: Pamela Mendez, female    DOB: 23-Nov-1965, 57 y.o.   MRN: [id number]  HPI See discuss above   Review of Systems     Objective:   Physical Exam Negative straight leg raise subjective discomfort lower back with  palpation lungs are clear hearts regular pulse normal extremities no edema       Assessment & Plan:  1. Encounter for long-term opiate analgesic use The patient was seen in followup for chronic pain. A review over at their current pain status was discussed. Drug registry was checked. Prescriptions were given.  Regular follow-up recommended. Discussion was held regarding the importance of compliance with medication as well as pain medication contract.  Patient was informed that medication may cause drowsiness and should not be combined  with other medications/alcohol or street drugs. If the patient feels medication is causing altered alertness then do not drive or operate dangerous equipment.  Should be noted that the patient appears to be meeting appropriate use of opioids and response.  Evidenced by improved function and decent pain control without significant side effects and no evidence of overt aberrancy issues.  Upon discussion with the patient today they understand that opioid therapy is optional and they feel that the pain has been refractory to reasonable conservative measures and is significant and affecting quality of life enough to warrant ongoing therapy and wishes to continue opioids.  Refills were provided.  - ToxASSURE Select 13 (MW), Urine  2. Gastroesophageal reflux disease without esophagitis She is on PPI continue this.  States is under good control  3. Other hyperlipidemia Rosuvastatin tolerating well taking the medicine regular basis  4. Hyperlipidemia associated with type 2 diabetes mellitus ([place]) Patient was encouraged to get lab work completed for which she did do her lab her last ordered back in September  5. Chronic low back pain with sciatica, sciatica laterality unspecified, unspecified back pain laterality I recommended warm compresses massage and gentle stretches if she gets worse over the course the next couple weeks or starts having significant setbacks to  let us know no need to do MRI currently.  Patient was encouraged to get eye exam Patient was encouraged to do shingles vaccine Patient encouraged to get her Pap smear completed

## 2022-09-11 LAB — TOXASSURE SELECT 13 (MW), URINE

## 2022-10-03 ENCOUNTER — Other Ambulatory Visit: Payer: Self-pay | Admitting: Family Medicine

## 2022-10-19 ENCOUNTER — Encounter: Payer: Self-pay | Admitting: Family Medicine

## 2022-10-19 ENCOUNTER — Telehealth: Payer: Self-pay | Admitting: *Deleted

## 2022-10-19 MED ORDER — AMLODIPINE BESYLATE 5 MG PO TABS: ORAL_TABLET | ORAL | 0 refills | Status: DC

## 2022-10-19 NOTE — Telephone Encounter (Signed)
Blood pressure is moderately elevated.  We can go ahead and start amlodipine 5 mg 1 daily #30, recommend follow-up office visit within 5 to 8 days thank you please assist with setting up a follow-up thank you

## 2022-10-19 NOTE — Telephone Encounter (Signed)
Pt contacted and verbalized understanding. Medication sent to pharmacy and pt is scheduled for Monday afternoon.

## 2022-10-19 NOTE — Telephone Encounter (Signed)
Patient stated he started with a "skull headache" last night and BP 151/98  This morning blood pressure 155/107 - patient states when she lost weight all her blood pressure meds were stopped and is concerned  [facility]

## 2022-10-20 DIAGNOSIS — E669 Obesity, unspecified: Secondary | ICD-10-CM | POA: Diagnosis not present

## 2022-10-20 DIAGNOSIS — J019 Acute sinusitis, unspecified: Secondary | ICD-10-CM | POA: Diagnosis not present

## 2022-10-20 DIAGNOSIS — Z6832 Body mass index (BMI) 32.0-32.9, adult: Secondary | ICD-10-CM | POA: Diagnosis not present

## 2022-10-23 ENCOUNTER — Ambulatory Visit: Payer: Medicaid Other | Admitting: Family Medicine

## 2022-10-23 ENCOUNTER — Telehealth: Payer: Self-pay | Admitting: Family Medicine

## 2022-10-23 ENCOUNTER — Encounter: Payer: Self-pay | Admitting: Family Medicine

## 2022-10-23 VITALS — BP 120/80 | Wt 203.6 lb

## 2022-10-23 DIAGNOSIS — M1711 Unilateral primary osteoarthritis, right knee: Secondary | ICD-10-CM | POA: Diagnosis not present

## 2022-10-23 DIAGNOSIS — I10 Essential (primary) hypertension: Secondary | ICD-10-CM | POA: Diagnosis not present

## 2022-10-23 MED ORDER — AMLODIPINE BESYLATE 5 MG PO TABS: ORAL_TABLET | ORAL | 1 refills | Status: DC

## 2022-10-23 NOTE — Progress Notes (Signed)
   Subjective:    Patient ID: Pamela Mendez, female    DOB: [birth year], 57 y.o.   MRN: [id number]  HPI Pt arrives for follow up on blood pressure. Pressure has been running good at home. No headaches. Taking Amlodipine 5 mg each morning.  Patient will Knee pain on the right side Urgent Care Friday-has bad sinus infection. Pt on Doxycycline 100 mg BID  She is taking her medicine on a regular basis.  Trying to watch salt.  Trying to fit in some walks at least couple times a week Please see previous telephone note Review of Systems     Objective:   Physical Exam General-in no acute distress Eyes-no discharge Lungs-respiratory rate normal, CTA CV-no murmurs,RRR Extremities skin warm dry no edema Neuro grossly normal Behavior normal, alert        Assessment & Plan:  HTN doing well on amlodipine 5 mg daily Patient will keep all regular follow-up visits Patient working hard on diet and exercise minimizing salt fitting and walking She will do her lab work before her next visit Right knee pain and discomfort over the past couple weeks consistent with possible osteoarthritis might benefit from an injection of steroids  Patient to do lab work before her follow-up visit in April

## 2022-10-23 NOTE — Telephone Encounter (Signed)
Nurses Under orders only please order lipid, liver, [id number], metabolic 7, urine ACR  Diagnosis diabetes hyperlipidemia hypertension patient will do this before her April visit thank you

## 2022-10-24 ENCOUNTER — Other Ambulatory Visit: Payer: Self-pay | Admitting: Family Medicine

## 2022-10-24 DIAGNOSIS — I10 Essential (primary) hypertension: Secondary | ICD-10-CM

## 2022-10-24 DIAGNOSIS — E1169 Type 2 diabetes mellitus with other specified complication: Secondary | ICD-10-CM

## 2022-10-24 DIAGNOSIS — E119 Type 2 diabetes mellitus without complications: Secondary | ICD-10-CM

## 2022-10-24 NOTE — Telephone Encounter (Signed)
Lab orders placed under orders only and mailed to patient as reminder.

## 2022-10-26 ENCOUNTER — Encounter: Payer: Self-pay | Admitting: Family Medicine

## 2022-10-26 NOTE — Telephone Encounter (Signed)
Nurses You may send in a Z-Pak for the patient with note to pharmacy that this is for sinusitis.  Also send Olivia Mackie notification that this has been sent in if any ongoing troubles recommend follow-up office visit thank you

## 2022-10-27 MED ORDER — AZITHROMYCIN 250 MG PO TABS: ORAL_TABLET | ORAL | 0 refills | Status: DC

## 2022-11-07 ENCOUNTER — Ambulatory Visit (INDEPENDENT_AMBULATORY_CARE_PROVIDER_SITE_OTHER): Payer: Medicaid Other | Admitting: Orthopedic Surgery

## 2022-11-07 ENCOUNTER — Encounter: Payer: Self-pay | Admitting: Orthopedic Surgery

## 2022-11-07 ENCOUNTER — Ambulatory Visit (INDEPENDENT_AMBULATORY_CARE_PROVIDER_SITE_OTHER): Payer: Medicaid Other

## 2022-11-07 VITALS — BP 133/84 | HR 93 | Ht 66.0 in | Wt 202.0 lb

## 2022-11-07 DIAGNOSIS — M25561 Pain in right knee: Secondary | ICD-10-CM

## 2022-11-07 DIAGNOSIS — M1711 Unilateral primary osteoarthritis, right knee: Secondary | ICD-10-CM | POA: Diagnosis not present

## 2022-11-07 DIAGNOSIS — G8929 Other chronic pain: Secondary | ICD-10-CM

## 2022-11-07 NOTE — Patient Instructions (Signed)

## 2022-11-07 NOTE — Progress Notes (Signed)
New Patient Visit  Assessment: Pamela Mendez is a 57 y.o. female with the following: 1. Chronic pain of right knee  Plan: Pamela Mendez has chronic right knee pain.  Remote history of injury to the right knee.  Nothing recent.  Dr. Durward Fortes has previously taken care of her, and per review of the chart, there does not appear to be a significant progression of arthritis within the right knee.  She has had injections in the past, and would like to continue with another injection.  This was completed without issues.  She will follow-up as needed.  Procedure note injection Right knee joint   Verbal consent was obtained to inject the right knee joint  Timeout was completed to confirm the site of injection.  The skin was prepped with alcohol and ethyl chloride was sprayed at the injection site.  A 21-gauge needle was used to inject 40 mg of Depo-Medrol and 1% lidocaine (3 cc) into the right knee using an anterolateral approach.  There were no complications. A sterile bandage was applied.   Follow-up: Return if symptoms worsen or fail to improve.  Subjective:  Chief Complaint  Patient presents with   Knee Pain    Rt knee pain for 2-3 mos. Did have an injury as a teenager where she "busted up her knee cap", did receive injection approx 15-16 yrs ago in same knee.     History of Present Illness: Pamela Mendez is a 57 y.o. female who presents for evaluation of right knee pain.  She has previously been followed by Dr. Durward Fortes.  It has been a couple years since she has been evaluated.  No specific injury to her knee recently.  However, she does report a remote injury to her knee, but does not provide a diagnosis.  Pain is primarily in the medial aspect of the knee, with some pain along the joint lines.  Injections in the past have been effective.  No recent physical therapy.   Review of Systems: No fevers or chills No numbness or tingling No chest pain No shortness of breath No bowel or  bladder dysfunction No GI distress No headaches   Medical History:  Past Medical History:  Diagnosis Date   Abdominal pain    Unspecified site   Anxiety    Anxiety and depression    Cardiomyopathy 2004   mild, post-partum   CHF (congestive heart failure) ([place]) 2004   post-partum   Chronic back pain    Chronic left hip pain    Depression    Gastritis    GERD (gastroesophageal reflux disease)    H/O echocardiogram 2012   EF 55-60%   Hyperlipidemia    Hypertension    Migraine headache without aura    Vertigo     Past Surgical History:  Procedure Laterality Date   ABLATION     Childbirth     time 3   CHOLECYSTECTOMY  2005   [facility] , Dr Cassell [facility] SURGERY  [address], Dr Sherley Bounds   LUMBAR [facility] SURGERY  2005   [facility], Dr Sherley Bounds    Family History  Problem Relation Age of Onset   Heart attack Father    Anesthesia problems Neg Hx    Hypotension Neg Hx    Malignant hyperthermia Neg Hx    Pseudochol deficiency Neg Hx    Social History   Tobacco Use   Smoking status: Former  Packs/day: 0.00    Years: 15.00    Total pack years: 0.00    Types: Cigarettes    Quit date: 08/23/2013    Years since quitting: 9.2   Smokeless tobacco: Never  Vaping Use   Vaping Use: Never used  Substance Use Topics   Alcohol use: No   Drug use: No    Allergies  Allergen Reactions   Cefzil [Cefprozil] Anaphylaxis and Swelling   Sulfa Antibiotics Anaphylaxis and Swelling   Augmentin [Amoxicillin-Pot Clavulanate] Other (See Comments)    Headache    Avelox [Moxifloxacin Hcl In Nacl] Other (See Comments)    Joint/muscle pain   Cortisporin-Tc [Neomycin-Colist-Hc-Thonzonium]     Cortisporin Otic    Current Meds  Medication Sig   amLODipine (NORVASC) 5 MG tablet Take one tablet po daily   cetirizine (ZYRTEC) 10 MG tablet 1 qd   diclofenac Sodium (VOLTAREN ARTHRITIS PAIN) 1 % GEL Apply 2 g topically 4 (four) times daily.   gabapentin  (NEURONTIN) 300 MG capsule 2 qam 1qnoon then 2qevening   metFORMIN (GLUCOPHAGE) 500 MG tablet Take 1 tablet (500 mg total) by mouth 2 (two) times daily with a meal.   montelukast (SINGULAIR) 10 MG tablet TAKE 1 TABLET BY MOUTH EVERY DAY   oxyCODONE-acetaminophen (PERCOCET/ROXICET) 5-325 MG tablet TAKE (1) TABLET BY MOUTH (4) TIMES DAILY AS NEEDED.   pantoprazole (PROTONIX) 40 MG tablet Take 1 tablet (40 mg total) by mouth daily.   potassium chloride SA (KLOR-CON M20) 20 MEQ tablet Take 2 tablets qam 2 tablets at noon and 2 in the evening. (Patient taking differently: Take 1 TABLET IN THE MORNING AS NEEDED)   rosuvastatin (CRESTOR) 20 MG tablet TAKE 1 TABLET BY MOUTH EVERY DAY   torsemide (DEMADEX) 20 MG tablet TAKE 2 TABLETS BY MOUTH EVERY MORNING AND 1 TABLETS AT NOON. (Patient taking differently: TAKE 1 TABLET BY MOUTH EVERY MORNING AS NEEDED)    Objective: BP 133/84   Pulse 93   Ht '5\' 6"'$  (1.676 m)   Wt 202 lb (91.6 kg)   BMI 32.60 kg/m   Physical Exam:  General: Alert and oriented. and No acute distress. Gait: Right sided antalgic gait.  Evaluation of the right knee demonstrates no swelling.  No bruising.  No redness.  Tenderness to palpation along medial aspect of the patella.  She achieve full extension.  She tolerates flexion beyond 90 degrees.  Negative Lachman.  No increased laxity varus valgus stress.  IMAGING: I personally ordered and reviewed the following images  The right knee were obtained in clinic today.  These are compared to available x-rays.  No acute injuries are noted.  Mild to moderate loss of joint space within the medial compartment.  Some small osteophytes are appreciated.  No bony lesions.  Overall alignment remains neutral.  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     Mordecai Rasmussen, MD  11/07/2022 2:33 PM

## 2022-11-16 ENCOUNTER — Other Ambulatory Visit: Payer: Self-pay | Admitting: *Deleted

## 2022-11-16 MED ORDER — ROSUVASTATIN CALCIUM 20 MG PO TABS: 20.0000 mg | ORAL_TABLET | Freq: Every day | ORAL | 0 refills | Status: DC

## 2022-12-02 ENCOUNTER — Other Ambulatory Visit: Payer: Self-pay | Admitting: Family Medicine

## 2022-12-04 DIAGNOSIS — E119 Type 2 diabetes mellitus without complications: Secondary | ICD-10-CM | POA: Diagnosis not present

## 2022-12-04 DIAGNOSIS — E785 Hyperlipidemia, unspecified: Secondary | ICD-10-CM | POA: Diagnosis not present

## 2022-12-04 DIAGNOSIS — I10 Essential (primary) hypertension: Secondary | ICD-10-CM | POA: Diagnosis not present

## 2022-12-04 DIAGNOSIS — E1169 Type 2 diabetes mellitus with other specified complication: Secondary | ICD-10-CM | POA: Diagnosis not present

## 2022-12-06 LAB — BASIC METABOLIC PANEL
BUN/Creatinine Ratio: 14 (ref 9–23)
BUN: 10 mg/dL (ref 6–24)
CO2: 24 mmol/L (ref 20–29)
Calcium: 9.1 mg/dL (ref 8.7–10.2)
Chloride: 105 mmol/L (ref 96–106)
Creatinine, Ser: 0.71 mg/dL (ref 0.57–1.00)
Glucose: 115 mg/dL — ABNORMAL HIGH (ref 70–99)
Potassium: 3.7 mmol/L (ref 3.5–5.2)
Sodium: 143 mmol/L (ref 134–144)
eGFR: 100 mL/min/{1.73_m2} (ref >59)

## 2022-12-06 LAB — LIPID PANEL
Chol/HDL Ratio: 2.5 ratio (ref 0.0–4.4)
Cholesterol, Total: 142 mg/dL (ref 100–199)
HDL: 56 mg/dL (ref >39)
LDL Chol Calc (NIH): 70 mg/dL (ref 0–99)
Triglycerides: 83 mg/dL (ref 0–149)
VLDL Cholesterol Cal: 16 mg/dL (ref 5–40)

## 2022-12-06 LAB — HEMOGLOBIN A1C
Est. average glucose Bld gHb Est-mCnc: 163 mg/dL
Hgb A1c MFr Bld: 7.3 % — ABNORMAL HIGH (ref 4.8–5.6)

## 2022-12-06 LAB — HEPATIC FUNCTION PANEL
ALT: 21 IU/L (ref 0–32)
AST: 22 IU/L (ref 0–40)
Albumin: 4.3 g/dL (ref 3.8–4.9)
Alkaline Phosphatase: 66 IU/L (ref 44–121)
Bilirubin Total: 0.6 mg/dL (ref 0.0–1.2)
Bilirubin, Direct: 0.17 mg/dL (ref 0.00–0.40)
Total Protein: 7.1 g/dL (ref 6.0–8.5)

## 2022-12-06 LAB — MICROALBUMIN / CREATININE URINE RATIO
Creatinine, Urine: 279.1 mg/dL
Microalb/Creat Ratio: 8 mg/g creat (ref 0–29)
Microalbumin, Urine: 21.4 ug/mL

## 2022-12-07 ENCOUNTER — Ambulatory Visit: Payer: Medicaid Other | Admitting: Family Medicine

## 2022-12-07 ENCOUNTER — Encounter: Payer: Self-pay | Admitting: Family Medicine

## 2022-12-07 VITALS — BP 106/68 | HR 64 | Temp 98.4°F | Ht 66.0 in | Wt 200.0 lb

## 2022-12-07 DIAGNOSIS — I10 Essential (primary) hypertension: Secondary | ICD-10-CM

## 2022-12-07 DIAGNOSIS — Z7984 Long term (current) use of oral hypoglycemic drugs: Secondary | ICD-10-CM

## 2022-12-07 DIAGNOSIS — G8929 Other chronic pain: Secondary | ICD-10-CM | POA: Diagnosis not present

## 2022-12-07 DIAGNOSIS — G894 Chronic pain syndrome: Secondary | ICD-10-CM

## 2022-12-07 DIAGNOSIS — E1169 Type 2 diabetes mellitus with other specified complication: Secondary | ICD-10-CM

## 2022-12-07 DIAGNOSIS — Z79891 Long term (current) use of opiate analgesic: Secondary | ICD-10-CM | POA: Diagnosis not present

## 2022-12-07 DIAGNOSIS — E785 Hyperlipidemia, unspecified: Secondary | ICD-10-CM | POA: Diagnosis not present

## 2022-12-07 DIAGNOSIS — M5440 Lumbago with sciatica, unspecified side: Secondary | ICD-10-CM

## 2022-12-07 DIAGNOSIS — E119 Type 2 diabetes mellitus without complications: Secondary | ICD-10-CM

## 2022-12-07 MED ORDER — OXYCODONE-ACETAMINOPHEN 5-325 MG PO TABS: ORAL_TABLET | ORAL | 0 refills | Status: DC

## 2022-12-07 MED ORDER — METFORMIN HCL 500 MG PO TABS: 500.0000 mg | ORAL_TABLET | Freq: Two times a day (BID) | ORAL | 1 refills | Status: DC

## 2022-12-07 NOTE — Progress Notes (Addendum)
Subjective:    Patient ID: Pamela Mendez, female    DOB: 01-Jan-1966, 57 y.o.   MRN: [id number]  HPI This patient was seen today for chronic pain  The medication list was reviewed and updated.  Location of Pain for which the patient has been treated with regarding narcotics: mid back and right leg  This patient had lumbar disc surgery in 2005 and a fusion in 2010 She has had ongoing back pain throughout all those years to the present She has tried NSAIDs, gabapentin, physical therapy, injections, surgery without success at alleviating her pain. She is under a pain Mining engineer.  Many years ago she was on hydrocodone and then several years ago she was shifted over to oxycodone.  She does do a yearly urine drug screen.  PDMP is checked on a regular basis.  She has follow-up office visits every 3 months.  Oxycodone is necessary in order for her to achieve reasonable function.  Pain medicine does help diminish her pain.  Her morphine equivalents are under 50.  She takes the 5 mg 4 times daily. Onset of this pain: chronic   -Compliance with medication: daily  - Number patient states they take daily: 4 per day  -when was the last dose patient took? 7 30 am today  The patient was advised the importance of maintaining medication and not using illegal substances with these.  Here for refills and follow up  The patient was educated that we can provide 3 monthly scripts for their medication, it is their responsibility to follow the instructions.  Side effects or complications from medications: none  Patient is aware that pain medications are meant to minimize the severity of the pain to allow their pain levels to improve to allow for better function. They are aware of that pain medications cannot totally remove their pain.  Due for UDT ( at least once per year) : 09/07/2022  Scale of 1 to 10 ( 1 is least 10 is most) Your pain level without the medicine: 9 Your pain level with  medication 3  Scale 1 to 10 ( 1-helps very little, 10 helps very well) How well does your pain medication reduce your pain so you can function better through out the day? 8  Quality of the pain: varies per activity   Persistence of the pain: throughout the day   Modifying factors: heating pad and stretches  The patient was seen today as part of a comprehensive diabetic check up. Patient has diabetes Patient relates good compliance with taking the medication. We discussed their diet and exercise activities  We also discussed the importance of notifying us if any excessively high glucoses or low sugars.    Patient here for follow-up regarding cholesterol.    Patient relates taking medication on a regular basis Denies problems with medication Importance of dietary measures discussed Regular lab work regarding lipid and liver was checked and if needing additional labs was appropriately ordered        Review of Systems     Objective:   Physical Exam  General-in no acute distress Eyes-no discharge Lungs-respiratory rate normal, CTA CV-no murmurs,RRR Extremities skin warm dry no edema Neuro grossly normal Behavior normal, alert       Assessment & Plan:  1. Diabetes mellitus without complication Patient will do lab work before next follow-up visit Increase metformin was taken to the day we reduced it down to 1/day but now A1c has gone back up so therefore increase  it to 1 twice a day If this does not get A1c where we needed to be then GLP-1 patient aware - Hemoglobin A1c  2. Primary hypertension HTN- patient seen for follow-up regarding HTN.   Diet, medication compliance, appropriate labs and refills were completed.   Importance of keeping blood pressure under good control to lessen the risk of complications discussed Regular follow-up visits discussed  - Basic Metabolic Panel  3. Hyperlipidemia associated with type 2 diabetes mellitus Hyperlipidemia-importance  of diet, weight control, activity, compliance with medications discussed.   Recent labs reviewed.   Any additional labs or refills ordered.   Importance of keeping under good control discussed. Regular follow-up visits discussed Lab work overall looks good  4. Encounter for long-term opiate analgesic use The patient was seen in followup for chronic pain. A review over at their current pain status was discussed. Drug registry was checked. Prescriptions were given.  Regular follow-up recommended. Discussion was held regarding the importance of compliance with medication as well as pain medication contract.  Patient was informed that medication may cause drowsiness and should not be combined  with other medications/alcohol or street drugs. If the patient feels medication is causing altered alertness then do not drive or operate dangerous equipment.  Should be noted that the patient appears to be meeting appropriate use of opioids and response.  Evidenced by improved function and decent pain control without significant side effects and no evidence of overt aberrancy issues.  Upon discussion with the patient today they understand that opioid therapy is optional and they feel that the pain has been refractory to reasonable conservative measures and is significant and affecting quality of life enough to warrant ongoing therapy and wishes to continue opioids.  Refills were provided. She does do a yearly drug screen Does yearly pain contract PDMP is checked on a regular basis Her MME is less than 50 She is compliant with her medicines It does help her function She does meet all the guidelines for chronic pain management both with DEA as well as state medical board. She does not get adequate relief with gabapentin and Tylenol NSAIDs alone.  She has tried injections physical therapy and failed surgery x 2 Her pain medicine is medically indicated.  Insurance company should cover this without question.   This level of pain medicine is commonly prescribed under pain management contract through family medicine offices.  Any denial of this type of management unfortunately delays care for the patient and increase coughs by potentially having a patient having to do a monthly pain management visit with a pain management doctor when that is not absolutely necessary 5. Chronic low back pain with sciatica, sciatica laterality unspecified, unspecified back pain laterality Stretching exercises  Follow-up again in 3 months  Addendum-03/05/2023 Pain management medication review  Reason for pain medication: Location of Pain for which the patient has been treated with regarding narcotics: mid back and right leg  This patient had lumbar disc surgery in 2005 and a fusion in 2010 She has had ongoing back pain throughout all those years to the present She has tried NSAIDs, gabapentin, physical therapy, injections, surgery without success at alleviating her pain. She is under a pain Mining engineer.  Many years ago she was on hydrocodone and then several years ago she was shifted over to oxycodone.  Reason for continued use of pain medication: Patient has ongoing severe pain in her back as well as leg.  NSAIDs does not adequately relieve their pain.  Gabapentin does not adequately relieve her pain.  She has had previous surgery and physical therapy.  Current plan of care: Home stretches, surgeon has told her that further surgery would not be beneficial, continue of NSAIDs gabapentin, continue pain medication as prescribed  PDMP reviewed at each pain management visit: This is reviewed at every visit.  She is compliant with her medicine.  There are no red flags  This documentation attest to the following:  The prescriber has reviewed and is adhering to [facility] statement on the use of controlled substances for the treatment of pain   Complete patient evaluation: She has had a complete  evaluation at our office plus also at the surgeon's office.  This is included previous MRIs as well as physical exam history and previous surgery   Establishment and annual yearly pain management contract reviewed and signed by patient: She does a yearly pain management contract with [place]   Informed consent by the patient: She does give [place] consent to can continue to treat with pain medicine   Periodic review: The pain improves is reviewed every 3 months at her pain management visits   Consultation with specialists and various treatment modalities as appropriate: She has had previous consultations with back specialist they have told her that further surgery would not be of value   Prescribing clinician has reviewed the CDC guidelines for prescribing opioids for chronic pain: We do follow the CDC guidelines.  We keep her MME at 50 or less   Directions for use of the requested medication has been reviewed with patient: The directions and the safe use of medicine has been reviewed with the patient    Medications previously tried for the patient's current condition: Hydrocodone, gabapentin, NSAIDs, Tylenol  If dosage frequency or quantity is greater than FDA recommendations why: It is not above the FDA guidelines  Clinical harm that would come to the patient if pain medication was abruptly discontinued or denied by insurance company: Patient would have severe pain without her medication which would limit her ability to do basic functions through her house as well as being able to be active with her family within reason  Reasons why patient is being managed in primary care and not pain management consultant: Her prescribing guidelines are well within the limits for primary care office to manage as pain management.  There is no need to go to pain management specialist currently unless her MME increases  Patient's pain management plan is in accordance with state and federal pain management guidelines  Patient  has not exhibited high risk behavior and has been compliant with treatment plan  Clinical outcomes if pain management not approved by insurance company: Patient would have significant setback of her physical capabilities without her pain medicine  Patient will be in considerable increased pain which will affect their daily activities  Patient will be informed that all necessary information has been given to the insurance companies to have this medication approved  Patient will be told that our office is following state and federal guidelines for pain management but many insurance companies are now reluctant to pay for pain management through primary care offices.  The result of this would be referral to pain management where unfortunately patient will have to go for monthly visits, in most cases monthly urine drug screens, and increased cost to the patient as well as the insurance company.  Patient will be informed that they have the right to file additional appeals with  the insurance companies or file complaints with the Teacher, English as a foreign language

## 2022-12-11 ENCOUNTER — Other Ambulatory Visit: Payer: Self-pay | Admitting: Family Medicine

## 2022-12-11 ENCOUNTER — Telehealth: Payer: Self-pay | Admitting: Family Medicine

## 2022-12-11 ENCOUNTER — Encounter: Payer: Self-pay | Admitting: Family Medicine

## 2022-12-11 MED ORDER — GABAPENTIN 300 MG PO CAPS: ORAL_CAPSULE | ORAL | 3 refills | Status: DC

## 2022-12-11 NOTE — Telephone Encounter (Signed)
Refill on  gabapentin (NEURONTIN) 300 MG capsule  send to [facility] [address]

## 2022-12-12 NOTE — Telephone Encounter (Signed)
Medication was sent in as requested thank you

## 2023-02-01 ENCOUNTER — Encounter: Payer: Self-pay | Admitting: Family Medicine

## 2023-02-05 ENCOUNTER — Other Ambulatory Visit: Payer: Self-pay | Admitting: Family Medicine

## 2023-02-09 NOTE — Telephone Encounter (Signed)
Error

## 2023-02-15 ENCOUNTER — Other Ambulatory Visit: Payer: Self-pay | Admitting: Family Medicine

## 2023-02-15 MED ORDER — OXYCODONE-ACETAMINOPHEN 5-325 MG PO TABS: ORAL_TABLET | ORAL | 0 refills | Status: DC

## 2023-02-16 ENCOUNTER — Encounter: Payer: Self-pay | Admitting: Family Medicine

## 2023-02-19 ENCOUNTER — Telehealth: Payer: Self-pay | Admitting: Family Medicine

## 2023-02-19 MED ORDER — PANTOPRAZOLE SODIUM 40 MG PO TBEC: 40.0000 mg | DELAYED_RELEASE_TABLET | Freq: Every day | ORAL | 3 refills | Status: DC

## 2023-02-19 NOTE — Telephone Encounter (Signed)
Refill on  pantoprazole (PROTONIX) 40 MG tablet  sent to [facility] [address]

## 2023-02-20 ENCOUNTER — Encounter: Payer: Self-pay | Admitting: *Deleted

## 2023-02-28 ENCOUNTER — Encounter: Payer: Self-pay | Admitting: Family Medicine

## 2023-03-01 ENCOUNTER — Other Ambulatory Visit: Payer: Self-pay | Admitting: Family Medicine

## 2023-03-01 MED ORDER — OXYCODONE-ACETAMINOPHEN 5-325 MG PO TABS: ORAL_TABLET | ORAL | 0 refills | Status: DC

## 2023-03-05 DIAGNOSIS — I10 Essential (primary) hypertension: Secondary | ICD-10-CM | POA: Diagnosis not present

## 2023-03-05 DIAGNOSIS — E119 Type 2 diabetes mellitus without complications: Secondary | ICD-10-CM | POA: Diagnosis not present

## 2023-03-05 LAB — HM DIABETES EYE EXAM

## 2023-03-06 LAB — BASIC METABOLIC PANEL
BUN/Creatinine Ratio: 13 (ref 9–23)
BUN: 11 mg/dL (ref 6–24)
CO2: 24 mmol/L (ref 20–29)
Calcium: 9.9 mg/dL (ref 8.7–10.2)
Chloride: 103 mmol/L (ref 96–106)
Creatinine, Ser: 0.88 mg/dL (ref 0.57–1.00)
Glucose: 119 mg/dL — ABNORMAL HIGH (ref 70–99)
Potassium: 4.4 mmol/L (ref 3.5–5.2)
Sodium: 141 mmol/L (ref 134–144)
eGFR: 77 mL/min/{1.73_m2} (ref >59)

## 2023-03-06 LAB — HEMOGLOBIN A1C
Est. average glucose Bld gHb Est-mCnc: 140 mg/dL
Hgb A1c MFr Bld: 6.5 % — ABNORMAL HIGH (ref 4.8–5.6)

## 2023-03-07 ENCOUNTER — Encounter: Payer: Self-pay | Admitting: Family Medicine

## 2023-03-07 DIAGNOSIS — H5213 Myopia, bilateral: Secondary | ICD-10-CM | POA: Diagnosis not present

## 2023-03-07 NOTE — Telephone Encounter (Signed)
We have submitted what they have requested We are meeting all of the criteria regarding chronic pain management Unfortunately insurance companies can have their own set of idiosyncrasies that can result with denials  Nurses-please have Vanice Sarah work with this case to see if she can get it approved.  I believe all the documentation that is necessary is within the most recent office visit documentation.  Personally I do not know what else to do.  If Raynelle Fanning tries and is unsuccessful the only option at this point would be to refer French Ana to a pain management clinic  You may keep Fred in the loop about what is going on thank you

## 2023-03-07 NOTE — Telephone Encounter (Signed)
The appeal was also denied after sending all the new information in the note- see denial in office

## 2023-03-09 ENCOUNTER — Encounter: Payer: Self-pay | Admitting: Family Medicine

## 2023-03-09 ENCOUNTER — Ambulatory Visit: Payer: Medicaid Other | Admitting: Family Medicine

## 2023-03-09 ENCOUNTER — Ambulatory Visit (HOSPITAL_COMMUNITY)
Admission: RE | Admit: 2023-03-09 | Discharge: 2023-03-09 | Disposition: A | Discharge status: Home / Self Care | Payer: Medicaid Other | Source: Ambulatory Visit | Attending: Family Medicine | Admitting: Family Medicine

## 2023-03-09 VITALS — BP 112/88 | HR 77 | Wt 196.4 lb

## 2023-03-09 DIAGNOSIS — M5440 Lumbago with sciatica, unspecified side: Secondary | ICD-10-CM | POA: Diagnosis not present

## 2023-03-09 DIAGNOSIS — G8929 Other chronic pain: Secondary | ICD-10-CM | POA: Diagnosis not present

## 2023-03-09 DIAGNOSIS — G894 Chronic pain syndrome: Secondary | ICD-10-CM

## 2023-03-09 DIAGNOSIS — M47816 Spondylosis without myelopathy or radiculopathy, lumbar region: Secondary | ICD-10-CM | POA: Diagnosis not present

## 2023-03-09 DIAGNOSIS — M4185 Other forms of scoliosis, thoracolumbar region: Secondary | ICD-10-CM | POA: Diagnosis not present

## 2023-03-09 DIAGNOSIS — Z79891 Long term (current) use of opiate analgesic: Secondary | ICD-10-CM

## 2023-03-09 DIAGNOSIS — M545 Low back pain, unspecified: Secondary | ICD-10-CM | POA: Diagnosis not present

## 2023-03-09 NOTE — Telephone Encounter (Signed)
[name]-I did send a staff message to Raynelle Fanning but please go ahead and put an official consult to Raynelle Fanning for help with prior authorization  Raynelle Fanning can connect with me with any specifics or questions that she has thank you

## 2023-03-09 NOTE — Telephone Encounter (Signed)
Nurses-please get a copy of eye examination for our records thank you

## 2023-03-09 NOTE — Progress Notes (Addendum)
Subjective:    Patient ID: Pamela Mendez, female    DOB: 10/29/1965, 57 y.o.   MRN: [id number]  HPI  This patient was seen today for chronic pain I truly feel for the patient We are doing everything we can to follow the state and federal guidelines regarding pain management [facility] medical board position paper on chronic pain management has been reviewed CDC pain management document for 2017 as well as the updated 2022 (46 pages) has also been reviewed PDMP is checked with every chronic pain management visit Yearly discussion of risk and benefits is held with the patient Yearly urine drug screen-she is considered low risk her MME is below 50 In my opinion she meets the guidelines for chronic pain management She has tried other measures It is clear that pain management measures with the use of opioids improves her pain levels to the point where she can function better  the medication list was reviewed and updated.   Location of Pain for which the patient has been treated with regarding narcotics: She has persistent low back pain despite having 2 surgeries 1 in the early 2000's and another 1 in 2010, she is has sciatica down the right leg which does not respond adequately to NSAIDs, gabapentin, and has been on hydrocodone  Onset of this pain: She has had back pain since the mid 90s along with sciatica down the right leg   -Compliance with medication: She relates good compliance with medicine  - Number patient states they take daily: She takes approximately 4/day her MME is 30  -Reason for ongoing use of opioids she has persistent low back pain and right leg sciatica despite 2 previous surgeries, the use of gabapentin, use of NSAIDs, physical therapy, and previous injections  What other measures have been tried outside of opioids back in the mid 1990s she started having back pain she was treated with physical therapy back then she saw back specialist she states she received  injections she ended up with an MRI ended up with surgery approximately 2003 and then had a second back surgery in 2010 along with physical therapy has tried gabapentin, NSAIDs and has been on hydrocodone since approximately 2013 and has been on oxycodone over the past year and a half She is on a pain management contract she does regular visits, PDMP checked on a regular basis, she does come for regular visits and her pain management follows all the guidelines from state and federal  In the ongoing specialists regarding this condition previously she has seen back surgeon but on last evaluation they stated that further surgery would not be of benefit  -when was the last dose patient took? 03/07/2023 0800  The patient was advised the importance of maintaining medication and not using illegal substances with these.  Here for refills and follow up  The patient was educated that we can provide 3 monthly scripts for their medication, it is their responsibility to follow the instructions.  Side effects or complications from medications: She denies side effects  Patient is aware that pain medications are meant to minimize the severity of the pain to allow their pain levels to improve to allow for better function. They are aware of that pain medications cannot totally remove their pain.  Due for UDT ( at least once per year) (pain management contract is also completed at the time of the UDT): 09/07/2022  Scale of 1 to 10 ( 1 is least 10 is most) Your pain  level without the medicine: 9 Your pain level with medication 5  Scale 1 to 10 ( 1-helps very little, 10 helps very well) How well does your pain medication reduce your pain so you can function better through out the day? 8  Quality of the pain: Throbbing aching  Persistence of the pain: Persistent all the time  Modifying factors: Worse with activity  Please see previous notes for further documentation     Review of Systems      Objective:   Physical Exam General-in no acute distress Eyes-no discharge Lungs-respiratory rate normal, CTA CV-no murmurs,RRR Extremities skin warm dry no edema Neuro grossly normal Behavior normal, alert Significant lower back pain to palpation with straight leg raise worse on the right side than the left side strength in the right leg fair no ataxia       Assessment & Plan:  Chronic lumbar pain 1. Chronic low back pain with sciatica, sciatica laterality unspecified, unspecified back pain laterality Chronic lumbar pain with sciatica on the right side recommend plain x-ray she has had previous surgery I do not find muscle weakness I would not recommend MRI currently - [facility]  2. Chronic pain syndrome We have followed the [facility] medical board as well as DEA guidelines for chronic pain management and trying to approach this in the responsible way unfortunately her new insurance company has not regulations that prevent her from getting her medicine As this is causing a dramatic decrease in her quality of life Her pain management with our office follows the current guidelines.  I do not feel that she is misusing medicine nor do I feel that we are mis-prescribing medicine. We have been consistent with yearly pain management contract, yearly urine drug screen, intermittent pill counts, review of risk and benefits of pain management and have received consent from the patient to continue forward with her pain management  - [facility] Lumbar Spine Complete

## 2023-03-14 ENCOUNTER — Encounter: Payer: Self-pay | Admitting: Family Medicine

## 2023-03-15 NOTE — Telephone Encounter (Signed)
See my chart message 03/14/23 with appeal/peer to peer information in it

## 2023-03-16 ENCOUNTER — Telehealth: Payer: Self-pay | Admitting: Family Medicine

## 2023-03-16 NOTE — Telephone Encounter (Signed)
Erica Please send a copy of 03/09/2023-I just completed a updated addendum-please send it to [facility] [place] Fax attention to [place] [phone]  Thank you-Dr. Lorin Picket

## 2023-03-21 ENCOUNTER — Encounter: Payer: Self-pay | Admitting: Family Medicine

## 2023-03-21 ENCOUNTER — Other Ambulatory Visit: Payer: Self-pay | Admitting: Family Medicine

## 2023-03-21 MED ORDER — OXYCODONE-ACETAMINOPHEN 5-325 MG PO TABS: ORAL_TABLET | ORAL | 0 refills | Status: DC

## 2023-03-28 ENCOUNTER — Ambulatory Visit: Payer: Medicaid Other

## 2023-04-16 ENCOUNTER — Telehealth: Payer: Self-pay | Admitting: Family Medicine

## 2023-04-16 MED ORDER — METFORMIN HCL 500 MG PO TABS: 500.0000 mg | ORAL_TABLET | Freq: Two times a day (BID) | ORAL | 0 refills | Status: DC

## 2023-04-16 NOTE — Telephone Encounter (Signed)
Medication sent to pharmacy. Patient to schedule follow up appointment with Dr Lorin Picket.

## 2023-04-16 NOTE — Telephone Encounter (Signed)
Refill on metformin 500 mg  send to [facility] . She states completely out

## 2023-04-16 NOTE — Telephone Encounter (Signed)
See MyChart message

## 2023-05-02 ENCOUNTER — Other Ambulatory Visit: Payer: Self-pay | Admitting: Family Medicine

## 2023-05-02 MED ORDER — OXYCODONE-ACETAMINOPHEN 5-325 MG PO TABS: ORAL_TABLET | ORAL | 0 refills | Status: DC

## 2023-05-04 ENCOUNTER — Other Ambulatory Visit: Payer: Self-pay

## 2023-05-04 ENCOUNTER — Other Ambulatory Visit: Payer: Self-pay | Admitting: Family Medicine

## 2023-05-04 DIAGNOSIS — H9190 Unspecified hearing loss, unspecified ear: Secondary | ICD-10-CM

## 2023-05-04 DIAGNOSIS — H6120 Impacted cerumen, unspecified ear: Secondary | ICD-10-CM

## 2023-05-17 ENCOUNTER — Telehealth: Payer: Self-pay

## 2023-05-17 MED ORDER — AMLODIPINE BESYLATE 5 MG PO TABS: ORAL_TABLET | ORAL | 0 refills | Status: DC

## 2023-05-17 NOTE — Telephone Encounter (Signed)
Prescription Request  05/17/2023  LOV: Visit date not found  What is the name of the medication or equipment? amLODipine (NORVASC) 5 MG tablet   Have you contacted your pharmacy to request a refill? Yes   Which pharmacy would you like this sent to?  [id number] - [place], [place] - [address] [place] [address] Phone: [phone] Fax: [phone]    Patient notified that their request is being sent to the clinical staff for review and that they should receive a response within 2 business days.   Please advise at Mobile [phone] (mobile)

## 2023-05-17 NOTE — Telephone Encounter (Signed)
Received via fax Rx request: Prescription sent electronically to pharmacy  

## 2023-06-07 ENCOUNTER — Ambulatory Visit: Payer: Medicaid Other | Admitting: Family Medicine

## 2023-07-20 ENCOUNTER — Other Ambulatory Visit: Payer: Self-pay | Admitting: Family Medicine

## 2023-08-07 ENCOUNTER — Ambulatory Visit: Payer: Medicaid Other | Admitting: Family Medicine

## 2023-08-07 VITALS — BP 134/82 | HR 78 | Temp 98.1°F | Ht 66.0 in | Wt 206.4 lb

## 2023-08-07 DIAGNOSIS — Z79891 Long term (current) use of opiate analgesic: Secondary | ICD-10-CM

## 2023-08-07 DIAGNOSIS — E1169 Type 2 diabetes mellitus with other specified complication: Secondary | ICD-10-CM

## 2023-08-07 DIAGNOSIS — E785 Hyperlipidemia, unspecified: Secondary | ICD-10-CM | POA: Diagnosis not present

## 2023-08-07 DIAGNOSIS — G8929 Other chronic pain: Secondary | ICD-10-CM

## 2023-08-07 DIAGNOSIS — M5440 Lumbago with sciatica, unspecified side: Secondary | ICD-10-CM | POA: Diagnosis not present

## 2023-08-07 DIAGNOSIS — I10 Essential (primary) hypertension: Secondary | ICD-10-CM

## 2023-08-07 DIAGNOSIS — Z23 Encounter for immunization: Secondary | ICD-10-CM

## 2023-08-07 DIAGNOSIS — Z79899 Other long term (current) drug therapy: Secondary | ICD-10-CM

## 2023-08-07 DIAGNOSIS — E119 Type 2 diabetes mellitus without complications: Secondary | ICD-10-CM

## 2023-08-07 MED ORDER — METFORMIN HCL 500 MG PO TABS
500.0000 mg | ORAL_TABLET | Freq: Two times a day (BID) | ORAL | 1 refills | Status: DC
Start: 2023-08-07 — End: 2024-02-13

## 2023-08-07 MED ORDER — OXYCODONE-ACETAMINOPHEN 5-325 MG PO TABS: ORAL_TABLET | ORAL | 0 refills | Status: DC

## 2023-08-07 MED ORDER — AMLODIPINE BESYLATE 5 MG PO TABS
ORAL_TABLET | ORAL | 1 refills | Status: DC
Start: 2023-08-07 — End: 2024-01-15

## 2023-08-07 NOTE — Progress Notes (Addendum)
 Subjective:    Patient ID: Pamela Mendez, female    DOB: 1966-06-19, 57 y.o.   MRN: [id number]  Discussed the use of AI scribe software for clinical note transcription with the patient, who gave verbal consent to proceed.  History of Present Illness   The patient, with a history of chronic pain, reports a recent onset of migraines. She describes the migraines as a throbbing ache that spans across the forehead and occurs daily. The patient manages the migraines by applying a cold mask over her eyes, which she reports as the only effective relief.  The patient also reports a history of back surgery, which has resulted in persistent numbness in one foot. Despite this, she is able to perform gentle stretches and maintain daily function. She also reports consistent use of her cholesterol medication and pain medication, the latter of which she avoids taking before driving.  The patient's mood appears to be influenced by the weather, with a preference for sunny days. She reports feeling better when exposed to sunlight, but avoids going outside when the temperature drops below 50 degrees due to exacerbation of body aches. She has been managing this by sitting in areas of her home where sunlight comes through.  The patient's medication regimen includes a fluid pill taken twice in the morning and once in the afternoon, as well as daily use of Astelin nasal spray and Zyrtec for allergies. She also takes Singulair, but denies any feelings of sadness or depression. The patient is mindful of her medication storage, keeping her pain medication locked up for safety.  The patient's last blood work was in July, with results indicating good kidney function and a decent A1c at 6.5. She has been advised to do another panel of blood work before her next visit. The patient is also consistent with her diabetic foot care, checking the bottom of her feet daily for any sores or blisters.     This patient was seen today for  chronic pain  The medication list was reviewed and updated. Should be noted that the patient relates that she has ongoing low back pain and discomfort. States the pain gets relatively severe but when she takes her medicine it does help ease it down She has had back surgery in 2005 as well as 2010 She essentially has failed relief of back pain from that She is done physical therapy around the time of those surgeries She also has tried NSAIDs and Tylenol in the past that did not adequately relieve She has been stable with her current medicine over the past several years and has completed every 51-month visits as well as yearly drug screens and intermittent pill counts She relates she benefits from the medicine allows her to function better throughout the day  Location of Pain for which the patient has been treated with regarding narcotics: Pain with sciatica  Onset of this pain: Been present for years previous surgeries   -Compliance with medication: Good compliance  - Number patient states they take daily: Maximum 4/day  -Reason for ongoing use of opioids unrelieved with Tylenol gabapentin NSAIDs  What other measures have been tried outside of opioids surgeries physical therapy injections see per above  In the ongoing specialists regarding this condition back specialist  -when was the last dose patient took?  Earlier today  The patient was advised the importance of maintaining medication and not using illegal substances with these.  Here for refills and follow up  The patient was educated  that we can provide 3 monthly scripts for their medication, it is their responsibility to follow the instructions.  Side effects or complications from medications: Denies side effects  Patient is aware that pain medications are meant to minimize the severity of the pain to allow their pain levels to improve to allow for better function. They are aware of that pain medications cannot totally remove  their pain.  Due for UDT ( at least once per year) (pain management contract is also completed at the time of the UDT): Today  Scale of 1 to 10 ( 1 is least 10 is most) Your pain level without the medicine: 9 Your pain level with medication 6  Scale 1 to 10 ( 1-helps very little, 10 helps very well) How well does your pain medication reduce your pain so you can function better through out the day?  8  Quality of the pain: Throbbing aching  Persistence of the pain: Present all the time  Modifying factors: Worse with activity         Review of Systems     Objective:    Physical Exam   CHEST: Clear lung sounds. CARDIOVASCULAR: Normal heart sounds. EXTREMITIES: No ankle edema. NEUROLOGICAL: Sensation intact in feet, numbness in left foot.     Diabetic foot exam normal bilateral Subjective discomfort in the back into the legs with increased pain with straight leg raise     Assessment & Plan:  Assessment and Plan    Seasonal Affective Disorder Reports feeling down during periods of less sunlight. Discussed benefits of sunlight exposure and potential use of light therapy. -Encouraged to spend time outside when weather permits. -Consider light therapy panel for indoor use.  Migraines Reports daily headaches, described as both throbbing and aching. Uses cold mask for relief. -Continue current management strategies.  Chronic Pain Reports consistent use of pain medication, except when driving. -Continue current pain management regimen. -Refill pain medication prescriptions.  Diabetes Reports numbness in foot post-back surgery. Regularly checks feet for sores or blisters. -Continue regular foot checks. -Consider use of humidifier to manage potential sinus issues related to diabetes.  Hyperlipidemia Reports consistent use of cholesterol medication. -Continue current medication regimen.  Allergies Reports consistent use of Astelin nasal spray and Zyrtec. -Continue  current medication regimen.  General Health Maintenance -Administer influenza vaccine today. -Order panel of blood work before next visit. -Continue regular eye check-ups.     Mild obesity portion control regular physical activity  1. Encounter for long-term opiate analgesic use The patient was seen in followup for chronic pain. A review over at their current pain status was discussed. Drug registry was checked. Prescriptions were given.  Regular follow-up recommended. Discussion was held regarding the importance of compliance with medication as well as pain medication contract.  Patient was informed that medication may cause drowsiness and should not be combined  with other medications/alcohol or street drugs. If the patient feels medication is causing altered alertness then do not drive or operate dangerous equipment.  Should be noted that the patient appears to be meeting appropriate use of opioids and response.  Evidenced by improved function and decent pain control without significant side effects and no evidence of overt aberrancy issues.  Upon discussion with the patient today they understand that opioid therapy is optional and they feel that the pain has been refractory to reasonable conservative measures and is significant and affecting quality of life enough to warrant ongoing therapy and wishes to continue opioids.  Refills were  provided.  [facility] medical Board guidelines regarding the pain medicine has been reviewed.  CDC guidelines most updated 2022 has been reviewed by the prescriber.  PDMP is checked on a regular basis yearly urine drug screen and pain management contract  - ToxASSURE Select 13 (MW), Urine  2. Diabetes mellitus without complication (HCC) The patient was seen today as part of a comprehensive visit for diabetes. The importance of keeping her A1c at or below 7 range was discussed.  Discussed diet, activity, and medication compliance Emphasized healthy  eating primarily with vegetables fruits and if utilizing meats lean meats such as chicken or fish grilled baked broiled Avoid sugary drinks Minimize and avoid processed foods Fit in regular physical activity preferably 25 to 30 minutes 4 times per week Standard follow-up visit recommended.  Patient aware lack of control and follow-up increases risk of diabetic complications. Regular follow-up visits Yearly ophthalmology Yearly foot exam  - Hemoglobin A1c - Basic Metabolic Panel - Microalbumin/Creatinine Ratio, Urine  3. Chronic low back pain with sciatica, sciatica laterality unspecified, unspecified back pain laterality Stretching, pain medication, good body mechanics, follow-up 3 months  4. Hyperlipidemia associated with type 2 diabetes mellitus (HCC) Hyperlipidemia-importance of diet, weight control, activity, compliance with medications discussed.   Recent labs reviewed.   Any additional labs or refills ordered.   Importance of keeping under good control discussed. Regular follow-up visits discussed  - Lipid Panel  5. Primary hypertension ,bsht   6. High risk medication use Check lab work - Hepatic Function Panel  7. Immunization due Today - Flu vaccine trivalent PF, 6mos and older(Flulaval,Afluria,Fluarix,Fluzone)  Addendum 10/16/2023 Recently her pharmacy states that they are denying her medication because they did not see details regarding her illness and treatment.  The current plan of care is for the patient to continue with her home regimen of stretching exercises and gentle strength exercises to try to help keep her core strengthening to allow her to function as best as possible It is also along the lines of continuing the gabapentin.  There is no indication for repeat of MRI.  There is no indication for repeated surgery.  Patient has seen neurosurgeon in the past who stated there was nothing more they can do for her recommended medication management.  She is  already gone through physical therapy and putting her through more would be pointless and typically is not covered well at all by insurance companies.  Acupuncture would be pointless. Continuing the current plan as per above is the best course of action for this patient to allow her to maintain good function.  This patient has longstanding chronic pain.  She has been on opioids greater than 10 years.  She has been faithful with her visits.  She does a yearly drug screen.  We do pill counts.  She was originally on hydrocodone and over the past several years was advanced to oxycodone.  NSAIDs Tylenol previous back surgeries as well as injections have not done well enough to control her pain.  She exhibits clinical improvement with chronic pain medicine allows her to maintain daily function.  Her medication is medically indicated.  Her medication prescribing by our practice meets all criteria by the CDC as well as the state medical board.  Without this pain medication her pain levels are such to where it incapacitates her from being able to do daily functions around her house and with her family.  Her medication is medically necessary and meets all legal criteria for chronic  pain prescribing.  We will resend this information to her insurance company if for some reason that does not meet their criteria we will have to get chronic pain management involved which typically would involve monthly visits which would drive the cost up for the patient and would drive the cost for the insurance company.  In our opinion we have met every legal responsibility showing that we are responsibly prescribing this medication in a way that has been demonstrated to shown improvement for this patient.  The patient has been made aware that we have done everything by the state medical criteria and her insurance company should cover this medication once this additional documentation is sent to them Lilyan Punt MD

## 2023-08-08 LAB — MED LIST OPTION NOT SELECTED

## 2023-08-09 ENCOUNTER — Telehealth: Payer: Self-pay

## 2023-08-09 LAB — TOXASSURE SELECT 13 (MW), URINE

## 2023-08-09 LAB — SPECIMEN STATUS REPORT

## 2023-08-09 MED ORDER — MONTELUKAST SODIUM 10 MG PO TABS: 10.0000 mg | ORAL_TABLET | Freq: Every day | ORAL | 1 refills | Status: DC

## 2023-08-09 NOTE — Telephone Encounter (Signed)
Prescription Request  08/09/2023  LOV: Visit date not found  What is the name of the medication or equipment? montelukast (SINGULAIR) 10 MG tablet   Have you contacted your pharmacy to request a refill? No   Which pharmacy would you like this sent to?  [id number] - [place], [place] - [address] [place] [address] Phone: [phone] Fax: [phone]    Patient notified that their request is being sent to the clinical staff for review and that they should receive a response within 2 business days.   Please advise at Mobile [phone] (mobile)

## 2023-10-14 ENCOUNTER — Encounter: Payer: Self-pay | Admitting: Family Medicine

## 2023-10-16 NOTE — Telephone Encounter (Signed)
PA submitted to insurance

## 2023-10-17 NOTE — Telephone Encounter (Signed)
So I documented additional information in her chart This seems to be a reoccurring issue with insurance companies regarding pain medicine Hopefully this will get it approved Please work with insurance company regarding appeal thank you

## 2023-10-19 NOTE — Telephone Encounter (Signed)
Please inform the patient that this process is being worked on unfortunately it is a drawnout process put in place by her insurance

## 2023-10-24 ENCOUNTER — Telehealth: Payer: Self-pay | Admitting: Family Medicine

## 2023-10-24 NOTE — Telephone Encounter (Signed)
 Nurses I am willing to do a peer to peer Unfortunately her insurance company has made it very difficult to get her pain medicine approved  Once you get the person who actually does the peer to peer on the line then I will speak with them  I would recommend doing this Thursday or Friday or Monday at the latest  Please see information below that was sent forwarded to me from [name]  Per [facility]- For an appeal there has to be a peer to peer with the clinician/physician who makes the PA decisions- the number to do a peer to peer appeal call is [phone]   It should be noted that I did try to call this number and unfortunately there is no streamlined approach that allows provider to get to the necessary person please work through this chaotic system as best as possible thank you

## 2023-10-29 ENCOUNTER — Ambulatory Visit: Payer: Self-pay | Admitting: Family Medicine

## 2023-10-29 NOTE — Telephone Encounter (Signed)
 Noted.

## 2023-10-29 NOTE — Telephone Encounter (Signed)
 Copied from CRM [id number]. Topic: Clinical - Red Word Triage >> Oct 29, 2023  9:40 AM Elle L wrote: Red Word that prompted transfer to Nurse Triage: The patient believes she has a sinus infection and it is causing severe pain and pressure behind her eye.  Chief Complaint: sinus pain Symptoms: pain Frequency: constant Pertinent Negatives: Patient denies cp, sob Disposition: [] ED /[] Urgent Care (no appt availability in office) / [x] Appointment(In office/virtual)/ []  [facility]/ [] Home Care/ [] Refused Recommended Disposition /[] [facility]/ []  Follow-up with PCP Additional Notes: apt made for today per protocol; instructed to go to er if becomes worse.   Reason for Disposition  [1] SEVERE pain AND [2] not improved 2 hours after pain medicine  Answer Assessment - Initial Assessment Questions 1. LOCATION: "Where does it hurt?"      Behind right eye and along sinus cavities 2. ONSET: "When did the sinus pain start?"  (e.g., hours, days)      Couple of days 3. SEVERITY: "How bad is the pain?"   (Scale 1-10; mild, moderate or severe)   - MILD (1-3): doesn't interfere with normal activities    - MODERATE (4-7): interferes with normal activities (e.g., work or school) or awakens from sleep   - SEVERE (8-10): excruciating pain and patient unable to do any normal activities        10/10 4. RECURRENT SYMPTOM: "Have you ever had sinus problems before?" If Yes, ask: "When was the last time?" and "What happened that time?"      yes 5. NASAL CONGESTION: "Is the nose blocked?" If Yes, ask: "Can you open it or must you breathe through your mouth?"     congested 6. NASAL DISCHARGE: "Do you have discharge from your nose?" If so ask, "What color?"     brown 7. FEVER: "Do you have a fever?" If Yes, ask: "What is it, how was it measured, and when did it start?"      denies 8. OTHER SYMPTOMS: "Do you have any other symptoms?" (e.g., sore throat, cough, earache, difficulty breathing)      denies 9. PREGNANCY: "Is there any chance you are pregnant?" "When was your last menstrual period?"     Denies.  Protocols used: Sinus Pain or Congestion-A-AH

## 2023-10-30 ENCOUNTER — Encounter: Payer: Self-pay | Admitting: Physician Assistant

## 2023-10-30 ENCOUNTER — Ambulatory Visit: Payer: Medicaid Other | Admitting: Physician Assistant

## 2023-10-30 ENCOUNTER — Ambulatory Visit: Payer: Medicaid Other | Admitting: Family Medicine

## 2023-10-30 VITALS — BP 124/78 | HR 93 | Temp 100.6°F | Ht 66.0 in | Wt 204.0 lb

## 2023-10-30 DIAGNOSIS — J0140 Acute pansinusitis, unspecified: Secondary | ICD-10-CM

## 2023-10-30 MED ORDER — DOXYCYCLINE HYCLATE 100 MG PO TABS: 100.0000 mg | ORAL_TABLET | Freq: Two times a day (BID) | ORAL | 0 refills | Status: AC

## 2023-10-30 NOTE — Progress Notes (Signed)
 Acute Office Visit  Subjective:     Patient ID: Pamela Mendez, female    DOB: 1965/12/15, 58 y.o.   MRN: [id number]   HPI Patient is in today for sinus pain. Patient reports symptoms starting 3 days ago. Associated symptoms include congestion and headache. She reports history of sinus infections and states symptoms feel similar today. She has been taking Tylenol sinus at home with little relief. Patient is eating and drinking normally and denies sick contacts.    Review of Systems  Constitutional:  Positive for malaise/fatigue. Negative for chills and fever.  HENT:  Positive for congestion, ear pain and sinus pain. Negative for sore throat.   Respiratory:  Negative for cough, shortness of breath and wheezing.   Cardiovascular:  Negative for chest pain and palpitations.  Neurological:  Positive for headaches.       Objective:     BP 124/78   Pulse 93   Temp (!) 100.6 F (38.1 C)   Ht 5\' 6"  (1.676 m)   Wt 204 lb (92.5 kg)   SpO2 96%   BMI 32.93 kg/m   Physical Exam Vitals reviewed.  Constitutional:      General: She is not in acute distress.    Appearance: Normal appearance.  HENT:     Right Ear: Tympanic membrane normal.     Left Ear: Tympanic membrane normal.     Nose: Congestion present.     Right Sinus: Maxillary sinus tenderness and frontal sinus tenderness present.     Left Sinus: Maxillary sinus tenderness and frontal sinus tenderness present.     Mouth/Throat:     Mouth: Mucous membranes are moist.     Pharynx: Oropharynx is clear. Posterior oropharyngeal erythema present.  Eyes:     Extraocular Movements: Extraocular movements intact.     Conjunctiva/sclera: Conjunctivae normal.  Cardiovascular:     Rate and Rhythm: Normal rate and regular rhythm.     Heart sounds: No murmur heard. Pulmonary:     Effort: Pulmonary effort is normal.     Breath sounds: Wheezing present.  Musculoskeletal:        General: Normal range of motion.  Skin:    General: Skin  is warm and dry.     Capillary Refill: Capillary refill takes less than 2 seconds.  Neurological:     General: No focal deficit present.     Mental Status: She is alert and oriented to person, place, and time.  Psychiatric:        Mood and Affect: Mood normal.        Behavior: Behavior normal.     No results found for any visits on 10/30/23.      Assessment & Plan:  Acute non-recurrent pansinusitis -     Doxycycline Hyclate; Take 1 tablet (100 mg total) by mouth 2 (two) times daily for 7 days.  Dispense: 14 tablet; Refill: 0   Presentation was consistent with sinusitis.  No evidence of other bacterial infections including pneumonia, pharyngitis, otitis media, or orbital cellulitis. Discussed that this fits the picture of viral vs bacterial sinusitis and that due to type and duration of symptoms and exam findings, we will treat as bacterial sinusitis.  Antibiotics prescribed. Advised to continue ibuprofen and Tylenol at home. The patient was instructed to return if the worsens in any way, especially if not tolerating fluids, increased sinus pain or swelling, worsening headache, persistent fever, difficulty swallowing or breathing, or as needed. The patient agreed with the plan.  Return if symptoms worsen or fail to improve.  Toni Amend Jariah Jarmon, PA-C

## 2023-10-31 ENCOUNTER — Telehealth: Payer: Self-pay | Admitting: Family Medicine

## 2023-10-31 ENCOUNTER — Other Ambulatory Visit: Payer: Self-pay | Admitting: Family Medicine

## 2023-10-31 NOTE — Telephone Encounter (Signed)
 Should be noted that the patient's pain medicine was denied On 2 separate occasions I tried to call the peer-to-peer line Unfortunately the [employer] puts people on and list link which makes it very difficult to get through Our nurses are trying to get through If unable to get through we will do a paper appeal but will need the patient to write out permission to do so

## 2023-10-31 NOTE — Telephone Encounter (Signed)
 Records faxed to appeal number provider

## 2023-10-31 NOTE — Telephone Encounter (Signed)
 I spoke with peer to peer- Vilinda Flake Discussed the case with them.  They stated if we would fax over the office visit from 08/07/2023 they would read look at the case and then you to call me with their opinion or issue and approval through fax  They have asked that this be emailed if possible to [email or web address] Or faxed to [phone]  They stated it is very important to put on the form EOCD [id number]

## 2023-10-31 NOTE — Telephone Encounter (Signed)
 I was able to speak with Pamela Mendez The updated information was sent via fax as requested They will give [place] feedback within the next 72 hours hopefully on her medication Hopefully things will be approved [name] was going to make sure that the updated information was faxed

## 2023-10-31 NOTE — Telephone Encounter (Signed)
 Peer to peer to contact Dr Lorin Picket within 24 business hours for review

## 2023-11-01 NOTE — Telephone Encounter (Signed)
This issue was resolved

## 2023-11-07 ENCOUNTER — Ambulatory Visit: Payer: Medicaid Other | Admitting: Family Medicine

## 2023-11-07 VITALS — BP 124/82 | HR 82 | Temp 98.6°F | Ht 66.0 in | Wt 204.0 lb

## 2023-11-07 DIAGNOSIS — J0100 Acute maxillary sinusitis, unspecified: Secondary | ICD-10-CM

## 2023-11-07 DIAGNOSIS — K219 Gastro-esophageal reflux disease without esophagitis: Secondary | ICD-10-CM | POA: Diagnosis not present

## 2023-11-07 DIAGNOSIS — Z79891 Long term (current) use of opiate analgesic: Secondary | ICD-10-CM | POA: Diagnosis not present

## 2023-11-07 DIAGNOSIS — E1169 Type 2 diabetes mellitus with other specified complication: Secondary | ICD-10-CM

## 2023-11-07 DIAGNOSIS — I10 Essential (primary) hypertension: Secondary | ICD-10-CM | POA: Diagnosis not present

## 2023-11-07 DIAGNOSIS — Z79899 Other long term (current) drug therapy: Secondary | ICD-10-CM | POA: Diagnosis not present

## 2023-11-07 DIAGNOSIS — E785 Hyperlipidemia, unspecified: Secondary | ICD-10-CM

## 2023-11-07 DIAGNOSIS — E119 Type 2 diabetes mellitus without complications: Secondary | ICD-10-CM

## 2023-11-07 MED ORDER — ROSUVASTATIN CALCIUM 20 MG PO TABS
20.0000 mg | ORAL_TABLET | Freq: Every day | ORAL | 1 refills | Status: DC
Start: 2023-11-07 — End: 2024-01-15

## 2023-11-07 MED ORDER — FAMOTIDINE 40 MG PO TABS: ORAL_TABLET | ORAL | 5 refills | Status: DC

## 2023-11-07 MED ORDER — OXYCODONE-ACETAMINOPHEN 5-325 MG PO TABS: ORAL_TABLET | ORAL | 0 refills | Status: DC

## 2023-11-07 MED ORDER — GABAPENTIN 300 MG PO CAPS: ORAL_CAPSULE | ORAL | 3 refills | Status: DC

## 2023-11-07 MED ORDER — DOXYCYCLINE HYCLATE 100 MG PO TABS: 100.0000 mg | ORAL_TABLET | Freq: Two times a day (BID) | ORAL | 0 refills | Status: DC

## 2023-11-07 MED ORDER — PANTOPRAZOLE SODIUM 40 MG PO TBEC: 40.0000 mg | DELAYED_RELEASE_TABLET | Freq: Every day | ORAL | 3 refills | Status: AC

## 2023-11-07 NOTE — Progress Notes (Signed)
 Subjective:    Patient ID: Pamela Mendez, female    DOB: [birth year], 58 y.o.   MRN: [id number]  HPI This patient was seen today for chronic pain  The medication list was reviewed and updated.   Location of Pain for which the patient has been treated with regarding narcotics: Lumbar pain with sciatica and neuropathy please see previous notes  Onset of this pain: Present for years   -Compliance with medication: Good compliance with medication  - Number patient states they take daily: For tablets per day  -Reason for ongoing use of opioids not get adequate relief with various modalities including previous surgery NSAIDs injections Tylenol  What other measures have been tried outside of opioids see above  In the ongoing specialists regarding this condition none currently previously back specialist  -when was the last dose patient took?  Earlier today  The patient was advised the importance of maintaining medication and not using illegal substances with these.  Here for refills and follow up  The patient was educated that we can provide 3 monthly scripts for their medication, it is their responsibility to follow the instructions.  Side effects or complications from medications: No side effects  Patient is aware that pain medications are meant to minimize the severity of the pain to allow their pain levels to improve to allow for better function. They are aware of that pain medications cannot totally remove their pain.  Due for UDT ( at least once per year) (pain management contract is also completed at the time of the UDT): Has had one of the past 3 months  Scale of 1 to 10 ( 1 is least 10 is most) Your pain level without the medicine: 10 Your pain level with medication 4  Scale 1 to 10 ( 1-helps very little, 10 helps very well) How well does your pain medication reduce your pain so you can function better through out the day? 8  Quality of the pain: Throbbing  aching  Persistence of the pain: Present all the time  Modifying factors: Worse with activity      Patient presents today with respiratory illness Number of days present-greater than 10  Symptoms include-head congestion drainage coughing sinus pressure pain discomfort  Presence of worrisome signs (severe shortness of breath, lethargy, etc.) -no wheezing or difficulty breathing  Recent/current visit to urgent care or ER-none  Recent direct exposure to Covid-Home test negative  Any current Covid testing-Home test negative  The patient was seen today as part of a comprehensive diabetic check up. Patient has diabetes Patient relates good compliance with taking the medication. We discussed their diet and exercise activities  We also discussed the importance of notifying us if any excessively high glucoses or low sugars.    Patient for blood pressure check up.  The patient does have hypertension.   Patient relates dietary measures try to minimize salt The importance of healthy diet and activity were discussed Patient relates compliance     Review of Systems     Objective:   Physical Exam General-in no acute distress Eyes-no discharge Lungs-respiratory rate normal, CTA CV-no murmurs,RRR Extremities skin warm dry no edema Neuro grossly normal Behavior normal, alert        Assessment & Plan:  1. Diabetes mellitus without complication (HCC) (Primary) Check labs in near future continue medication healthy diet  2. Primary hypertension Blood pressure good control continue medication labs previously ordered  3. Hyperlipidemia associated with type 2 diabetes mellitus (HCC)  Patient to do lab work continue medication healthy diet  4. Encounter for long-term opiate analgesic use The patient was seen in followup for chronic pain. A review over at their current pain status was discussed. Drug registry was checked. Prescriptions were given.  Regular follow-up  recommended. Discussion was held regarding the importance of compliance with medication as well as pain medication contract.  Patient was informed that medication may cause drowsiness and should not be combined  with other medications/alcohol or street drugs. If the patient feels medication is causing altered alertness then do not drive or operate dangerous equipment.  Should be noted that the patient appears to be meeting appropriate use of opioids and response.  Evidenced by improved function and decent pain control without significant side effects and no evidence of overt aberrancy issues.  Upon discussion with the patient today they understand that opioid therapy is optional and they feel that the pain has been refractory to reasonable conservative measures and is significant and affecting quality of life enough to warrant ongoing therapy and wishes to continue opioids.  Refills were provided.  [facility] medical Board guidelines regarding the pain medicine has been reviewed.  CDC guidelines most updated 2022 has been reviewed by the prescriber.  PDMP is checked on a regular basis yearly urine drug screen and pain management contract  Treatment plan for this patient includes #1-gentle stretching exercises as shown daily basis 2.  Mild strength exercises 3 times per week #3 continue pain medications #4 notify us if any digression   5. Acute non-recurrent maxillary sinusitis Doxycycline 7 additional days  6. Gastroesophageal reflux disease without esophagitis Add famotidine as needed

## 2023-11-08 LAB — BASIC METABOLIC PANEL
BUN/Creatinine Ratio: 14 (ref 9–23)
BUN: 12 mg/dL (ref 6–24)
CO2: 23 mmol/L (ref 20–29)
Calcium: 10.2 mg/dL (ref 8.7–10.2)
Chloride: 105 mmol/L (ref 96–106)
Creatinine, Ser: 0.86 mg/dL (ref 0.57–1.00)
Glucose: 99 mg/dL (ref 70–99)
Potassium: 4.1 mmol/L (ref 3.5–5.2)
Sodium: 145 mmol/L — ABNORMAL HIGH (ref 134–144)
eGFR: 79 mL/min/{1.73_m2} (ref >59)

## 2023-11-08 LAB — LIPID PANEL
Chol/HDL Ratio: 2.7 ratio (ref 0.0–4.4)
Cholesterol, Total: 136 mg/dL (ref 100–199)
HDL: 50 mg/dL (ref >39)
LDL Chol Calc (NIH): 55 mg/dL (ref 0–99)
Triglycerides: 193 mg/dL — ABNORMAL HIGH (ref 0–149)
VLDL Cholesterol Cal: 31 mg/dL (ref 5–40)

## 2023-11-08 LAB — HEPATIC FUNCTION PANEL
ALT: 17 IU/L (ref 0–32)
AST: 18 IU/L (ref 0–40)
Albumin: 4.3 g/dL (ref 3.8–4.9)
Alkaline Phosphatase: 67 IU/L (ref 44–121)
Bilirubin Total: 0.4 mg/dL (ref 0.0–1.2)
Bilirubin, Direct: 0.12 mg/dL (ref 0.00–0.40)
Total Protein: 7.3 g/dL (ref 6.0–8.5)

## 2023-11-08 LAB — HEMOGLOBIN A1C
Est. average glucose Bld gHb Est-mCnc: 157 mg/dL
Hgb A1c MFr Bld: 7.1 % — ABNORMAL HIGH (ref 4.8–5.6)

## 2023-11-08 LAB — MICROALBUMIN / CREATININE URINE RATIO
Creatinine, Urine: 208.8 mg/dL
Microalb/Creat Ratio: 7 mg/g{creat} (ref 0–29)
Microalbumin, Urine: 15.1 ug/mL

## 2023-11-09 ENCOUNTER — Encounter: Payer: Self-pay | Admitting: Family Medicine

## 2023-11-10 NOTE — Telephone Encounter (Signed)
 Nurses Please order Ozempic 0.25 mg Patient already on oral medications Hopefully she will get approved for GLP-1 because her A1c is going up She also has risk factors for heart disease to go along with this  Ozempic 0.25 mg once weekly for the first 4 weeks then after that go to 0.5 mg once weekly Patient has standard follow-up office visits for her pain management and diabetes  Patient to do A1c and metabolic 7 before that follow-up visit  Please also make sure patient is aware that getting this medication is a process that requires jumping through a lot of hoops which can sometimes take time-please keep her abreast of what is going on thank you-Dr. Lorin Picket

## 2023-11-12 ENCOUNTER — Other Ambulatory Visit: Payer: Self-pay

## 2023-11-12 DIAGNOSIS — I509 Heart failure, unspecified: Secondary | ICD-10-CM

## 2023-11-12 DIAGNOSIS — R7309 Other abnormal glucose: Secondary | ICD-10-CM

## 2023-11-12 DIAGNOSIS — E119 Type 2 diabetes mellitus without complications: Secondary | ICD-10-CM

## 2023-11-12 MED ORDER — SEMAGLUTIDE(0.25 OR 0.5MG/DOS) 2 MG/3ML ~~LOC~~ SOPN: PEN_INJECTOR | SUBCUTANEOUS | 2 refills | Status: DC

## 2023-11-19 ENCOUNTER — Other Ambulatory Visit: Payer: Self-pay | Admitting: *Deleted

## 2023-11-19 DIAGNOSIS — I509 Heart failure, unspecified: Secondary | ICD-10-CM

## 2023-11-19 DIAGNOSIS — R7309 Other abnormal glucose: Secondary | ICD-10-CM

## 2023-11-19 DIAGNOSIS — E119 Type 2 diabetes mellitus without complications: Secondary | ICD-10-CM

## 2023-11-19 MED ORDER — SEMAGLUTIDE(0.25 OR 0.5MG/DOS) 2 MG/3ML ~~LOC~~ SOPN: PEN_INJECTOR | SUBCUTANEOUS | 2 refills | Status: DC

## 2023-11-19 NOTE — Telephone Encounter (Signed)
 Currently still waiting on PA

## 2023-12-29 ENCOUNTER — Other Ambulatory Visit: Payer: Self-pay | Admitting: Family Medicine

## 2024-01-15 ENCOUNTER — Other Ambulatory Visit: Payer: Self-pay | Admitting: Family Medicine

## 2024-01-15 DIAGNOSIS — I10 Essential (primary) hypertension: Secondary | ICD-10-CM

## 2024-02-13 ENCOUNTER — Ambulatory Visit: Admitting: Family Medicine

## 2024-02-13 ENCOUNTER — Ambulatory Visit: Payer: Self-pay | Admitting: Family Medicine

## 2024-02-13 VITALS — BP 103/67 | HR 86 | Temp 99.9°F | Ht 66.0 in | Wt 195.4 lb

## 2024-02-13 DIAGNOSIS — Z23 Encounter for immunization: Secondary | ICD-10-CM | POA: Diagnosis not present

## 2024-02-13 DIAGNOSIS — Z79891 Long term (current) use of opiate analgesic: Secondary | ICD-10-CM

## 2024-02-13 DIAGNOSIS — E119 Type 2 diabetes mellitus without complications: Secondary | ICD-10-CM

## 2024-02-13 DIAGNOSIS — I10 Essential (primary) hypertension: Secondary | ICD-10-CM | POA: Diagnosis not present

## 2024-02-13 DIAGNOSIS — R7309 Other abnormal glucose: Secondary | ICD-10-CM

## 2024-02-13 DIAGNOSIS — Z7985 Long-term (current) use of injectable non-insulin antidiabetic drugs: Secondary | ICD-10-CM | POA: Diagnosis not present

## 2024-02-13 LAB — BASIC METABOLIC PANEL WITH GFR
BUN/Creatinine Ratio: 9 (ref 9–23)
BUN: 8 mg/dL (ref 6–24)
CO2: 20 mmol/L (ref 20–29)
Calcium: 9.6 mg/dL (ref 8.7–10.2)
Chloride: 106 mmol/L (ref 96–106)
Creatinine, Ser: 0.87 mg/dL (ref 0.57–1.00)
Glucose: 111 mg/dL — ABNORMAL HIGH (ref 70–99)
Potassium: 4.4 mmol/L (ref 3.5–5.2)
Sodium: 143 mmol/L (ref 134–144)
eGFR: 78 mL/min/{1.73_m2} (ref >59)

## 2024-02-13 LAB — HEMOGLOBIN A1C
Est. average glucose Bld gHb Est-mCnc: 126 mg/dL
Hgb A1c MFr Bld: 6 % — ABNORMAL HIGH (ref 4.8–5.6)

## 2024-02-13 MED ORDER — OXYCODONE-ACETAMINOPHEN 5-325 MG PO TABS: ORAL_TABLET | ORAL | 0 refills | Status: DC

## 2024-02-13 MED ORDER — AMLODIPINE BESYLATE 2.5 MG PO TABS: 2.5000 mg | ORAL_TABLET | Freq: Every day | ORAL | 3 refills | Status: DC

## 2024-02-13 MED ORDER — GABAPENTIN 300 MG PO CAPS: ORAL_CAPSULE | ORAL | 3 refills | Status: DC

## 2024-02-13 MED ORDER — FAMOTIDINE 40 MG PO TABS: ORAL_TABLET | ORAL | 5 refills | Status: DC

## 2024-02-13 MED ORDER — SEMAGLUTIDE(0.25 OR 0.5MG/DOS) 2 MG/3ML ~~LOC~~ SOPN: PEN_INJECTOR | SUBCUTANEOUS | 2 refills | Status: DC

## 2024-02-13 MED ORDER — METFORMIN HCL 500 MG PO TABS
500.0000 mg | ORAL_TABLET | Freq: Two times a day (BID) | ORAL | 1 refills | Status: DC
Start: 2024-02-13 — End: 2024-05-14

## 2024-02-13 NOTE — Progress Notes (Signed)
 Subjective:    Patient ID: Pamela Mendez, female    DOB: 18-Nov-1965, 58 y.o.   MRN: [id number]  HPI Patient does have chronic pain.  Patient does relate compliance with taking the medication as directed.  Patient denies negative side effects.  Patient states pain medicine does help with function.  Drug registry was checked.  Patient understands the importance of never driving if feeling sedated.  Patient understands the importance of notifying us  if any problems with pain medicine.  Patient denies medication being used by anyone else.  Patient relates medication is kept in a safe spot.   Discussed the use of AI scribe software for clinical note transcription with the patient, who gave verbal consent to proceed.  History of Present Illness   Pamela Mendez is a 58 year old female with diabetes and hypertension who presents for a follow-up visit.  She is managing her medications well, including pain medication taken four times a day, cholesterol medication, and Ozempic  for diabetes. Ozempic  does not cause nausea and helps her feel full faster, contributing to weight loss. Her A1c has improved from 7.3% in April 2024 to 6.0% in June 2025. She has lost 10 pounds, now weighing 195 pounds.  She engages in physical activity such as walking and using stretch bands occasionally. She wants to use the pool for exercise, as it is easier on her back. She experiences variable back pain, sometimes worse in the mornings, but manages it with gabapentin , which does not cause drowsiness or dizziness.  She takes an acid blocker for her stomach and experiences heartburn only once or twice a month, indicating improvement. She continues to take metformin  for diabetes management and pantoprazole  for acid reflux. No issues with medication refills.  She occasionally takes torsemide  for lower leg and foot swelling, along with potassium, but not on a daily basis. No dizziness or lightheadedness when standing, except when  her sinuses are congested, causing stuffiness and occasional rhinorrhea.  She plans to see her eye doctor soon and keeps her glasses with her at all times. She has a history of a foot issue where bones have fractured, causing pain and numbness, particularly on the right side, which she associates with a past vaccine. She experiences occasional tingling in her leg, similar to a limb 'waking up' after being asleep.      This patient was seen today for chronic pain  The medication list was reviewed and updated.   Location of Pain for which the patient has been treated with regarding narcotics: Low back pain present ongoing  Onset of this pain: Present for years   -Compliance with medication: Good compliance with medicine  - Number patient states they take daily: Typically 4 tablets daily  -Reason for ongoing use of opioids sciatica and lumbar disc problems previous surgery  What other measures have been tried outside of opioids physical therapy, surgery, injections  In the ongoing specialists regarding this condition previously back specialist none currently  -when was the last dose patient took?  Earlier today  The patient was advised the importance of maintaining medication and not using illegal substances with these.  Here for refills and follow up  The patient was educated that we can provide 3 monthly scripts for their medication, it is their responsibility to follow the instructions.  Side effects or complications from medications: None  Patient is aware that pain medications are meant to minimize the severity of the pain to allow their pain levels to improve  to allow for better function. They are aware of that pain medications cannot totally remove their pain.  Due for UDT ( at least once per year) (pain management contract is also completed at the time of the UDT): Up-to-date  Scale of 1 to 10 ( 1 is least 10 is most) Your pain level without the medicine: 8 Your pain  level with medication 4  Scale 1 to 10 ( 1-helps very little, 10 helps very well) How well does your pain medication reduce your pain so you can function better through out the day? 7  Quality of the pain: Throbbing aching  Persistence of the pain: Present all the time  Modifying factors: Worse with activity      Review of Systems     Objective:   Physical Exam  General-in no acute distress Eyes-no discharge Lungs-respiratory rate normal, CTA CV-no murmurs,RRR Extremities skin warm dry no edema Neuro grossly normal Behavior normal, alert       Assessment & Plan:  Assessment and Plan    Type 2 Diabetes Mellitus Diabetes well-controlled with HbA1c improved to 6.0%. Ozempic  tolerated well, contributing to weight loss and reduced cardiovascular risk. Continued metformin  use advised for ongoing benefits. - Continue Ozempic  as prescribed. - Continue metformin  as prescribed. - Encourage continued weight management and dietary mindfulness. - Perform diabetic foot exam today.  Hypertension Blood pressure on lower end due to medication regimen. No symptoms of hypotension. Plan to reduce amlodipine  to prevent hypotension. - Reduce amlodipine  dosage to 2.5 mg daily. - Monitor blood pressure regularly. - Reassess blood pressure control in 3 months.  Chronic Pain Pain managed with current regimen. Gabapentin  effective without side effects. Variable back pain noted. - Continue current pain management regimen. - Encourage use of stretch bands and pool exercises for back pain relief.  Hyperlipidemia Cholesterol managed with medication. Previous levels satisfactory. - Continue current cholesterol medication. - Reassess lipid profile at next visit.  Gastroesophageal Reflux Disease (GERD) GERD well-controlled with pantoprazole . Infrequent heartburn. - Continue pantoprazole  as prescribed.  General Health Maintenance Discussed pneumococcal vaccination benefits for diabetics.  Vaccine recommended to reduce pneumonia risk. - Administer pneumococcal 20 vaccine today. - Ensure eye doctor is aware of diabetes status during next visit.  Follow-up Plan to reassess blood pressure and health status. No blood work needed next visit. - Schedule follow-up appointment in 3 months.      1. Diabetes mellitus without complication (HCC) Diabetes under good control doing much better with the GLP-1 continue the metformin  she is losing weight as well as A1c looks better she is benefiting from the GLP-1 and tolerating - metFORMIN  (GLUCOPHAGE ) 500 MG tablet; Take 1 tablet (500 mg total) by mouth 2 (two) times daily with a meal.  Dispense: 180 tablet; Refill: 1 - Semaglutide ,0.25 or 0.5MG /DOS, 2 MG/3ML SOPN; .25 mg for once a week  Dispense: 3 mL; Refill: 2 - Pneumococcal conjugate vaccine 20-valent (Prevnar 20)  2. Elevated hemoglobin A1c Improvement is noted repeat labs in 6 months time - Semaglutide ,0.25 or 0.5MG /DOS, 2 MG/3ML SOPN; .25 mg for once a week  Dispense: 3 mL; Refill: 2  4. Encounter for long-term opiate analgesic use The patient was seen in followup for chronic pain. A review over at their current pain status was discussed. Drug registry was checked. Prescriptions were given.  Regular follow-up recommended. Discussion was held regarding the importance of compliance with medication as well as pain medication contract.  Patient was informed that medication may cause drowsiness and should not  be combined  with other medications/alcohol or street drugs. If the patient feels medication is causing altered alertness then do not drive or operate dangerous equipment.  Should be noted that the patient appears to be meeting appropriate use of opioids and response.  Evidenced by improved function and decent pain control without significant side effects and no evidence of overt aberrancy issues.  Upon discussion with the patient today they understand that opioid therapy is  optional and they feel that the pain has been refractory to reasonable conservative measures and is significant and affecting quality of life enough to warrant ongoing therapy and wishes to continue opioids.  Refills were provided.  [place]  medical Board guidelines regarding the pain medicine has been reviewed.  CDC guidelines most updated 2022 has been reviewed by the prescriber.  PDMP is checked on a regular basis yearly urine drug screen and pain management contract  Treatment plan for this patient includes #1-gentle stretching exercises as shown daily basis 2.  Mild strength exercises 3 times per week #3 continue pain medications #4 notify us  if any digression Up-to-date on urine drug screen - oxyCODONE -acetaminophen  (PERCOCET/ROXICET) 5-325 MG tablet; 1 pill 4 times daily as needed severe pain caution drowsiness  Dispense: 120 tablet; Refill: 0 - oxyCODONE -acetaminophen  (PERCOCET/ROXICET) 5-325 MG tablet; 1 pill 4 times daily as needed severe pain caution drowsiness  Dispense: 120 tablet; Refill: 0 - oxyCODONE -acetaminophen  (PERCOCET/ROXICET) 5-325 MG tablet; TAKE (1) TABLET BY MOUTH (4) TIMES DAILY AS NEEDED.  Dispense: 120 tablet; Refill: 0  5. Primary hypertension Blood pressure on the low end reduce amlodipine  - amLODipine  (NORVASC ) 2.5 MG tablet; Take 1 tablet (2.5 mg total) by mouth daily.  Dispense: 90 tablet; Refill: 3  6. Immunization due (Primary) Today - Pneumococcal conjugate vaccine 20-valent (Prevnar 20)  Follow-up 3 months

## 2024-02-14 ENCOUNTER — Encounter: Payer: Self-pay | Admitting: Family Medicine

## 2024-02-15 ENCOUNTER — Other Ambulatory Visit: Payer: Self-pay | Admitting: Family Medicine

## 2024-02-15 MED ORDER — DOXYCYCLINE HYCLATE 100 MG PO TABS: 100.0000 mg | ORAL_TABLET | Freq: Two times a day (BID) | ORAL | 0 refills | Status: DC

## 2024-02-20 ENCOUNTER — Encounter: Payer: Self-pay | Admitting: Family Medicine

## 2024-02-20 NOTE — Telephone Encounter (Signed)
 Nurses I am not allowed to address a different patient's situation on another patient's chart so therefore I am sending a telephone message to the nurses to be able to handle the question that the patient is posing regarding her husband thank you

## 2024-03-03 ENCOUNTER — Telehealth: Payer: Self-pay | Admitting: Family Medicine

## 2024-03-03 ENCOUNTER — Other Ambulatory Visit: Payer: Self-pay

## 2024-03-03 MED ORDER — MONTELUKAST SODIUM 10 MG PO TABS: 10.0000 mg | ORAL_TABLET | Freq: Every day | ORAL | 1 refills | Status: DC

## 2024-03-03 NOTE — Telephone Encounter (Signed)
 Refill on  montelukast  (SINGULAIR ) 10 MG tablet  [facility]

## 2024-05-12 ENCOUNTER — Encounter: Payer: Self-pay | Admitting: Family Medicine

## 2024-05-14 ENCOUNTER — Encounter: Payer: Self-pay | Admitting: Family Medicine

## 2024-05-14 ENCOUNTER — Ambulatory Visit: Admitting: Family Medicine

## 2024-05-14 VITALS — BP 114/80 | HR 98 | Temp 97.2°F | Ht 66.0 in | Wt 199.0 lb

## 2024-05-14 DIAGNOSIS — E1169 Type 2 diabetes mellitus with other specified complication: Secondary | ICD-10-CM

## 2024-05-14 DIAGNOSIS — Z79891 Long term (current) use of opiate analgesic: Secondary | ICD-10-CM | POA: Diagnosis not present

## 2024-05-14 DIAGNOSIS — G8929 Other chronic pain: Secondary | ICD-10-CM | POA: Diagnosis not present

## 2024-05-14 DIAGNOSIS — Z23 Encounter for immunization: Secondary | ICD-10-CM

## 2024-05-14 DIAGNOSIS — M5440 Lumbago with sciatica, unspecified side: Secondary | ICD-10-CM

## 2024-05-14 DIAGNOSIS — E119 Type 2 diabetes mellitus without complications: Secondary | ICD-10-CM

## 2024-05-14 DIAGNOSIS — E785 Hyperlipidemia, unspecified: Secondary | ICD-10-CM

## 2024-05-14 DIAGNOSIS — I10 Essential (primary) hypertension: Secondary | ICD-10-CM | POA: Diagnosis not present

## 2024-05-14 MED ORDER — OXYCODONE-ACETAMINOPHEN 5-325 MG PO TABS
ORAL_TABLET | ORAL | 0 refills | Status: AC
Start: 2024-05-14 — End: ?

## 2024-05-14 MED ORDER — FAMOTIDINE 40 MG PO TABS: ORAL_TABLET | ORAL | 5 refills | Status: DC

## 2024-05-14 MED ORDER — GABAPENTIN 300 MG PO CAPS: ORAL_CAPSULE | ORAL | 3 refills | Status: AC

## 2024-05-14 MED ORDER — OXYCODONE-ACETAMINOPHEN 5-325 MG PO TABS: ORAL_TABLET | ORAL | 0 refills | Status: DC

## 2024-05-14 MED ORDER — METFORMIN HCL 500 MG PO TABS: 500.0000 mg | ORAL_TABLET | Freq: Two times a day (BID) | ORAL | 1 refills | Status: DC

## 2024-05-14 NOTE — Addendum Note (Signed)
 Addended by: ALPHONSA HAMILTON A on: 05/14/2024 08:42 PM   Modules accepted: Orders

## 2024-05-14 NOTE — Progress Notes (Signed)
 Subjective:    Patient ID: Pamela Mendez, female    DOB: 27-Dec-1965, 58 y.o.   MRN: [id number]  HPI This patient was seen today for chronic pain  The medication list was reviewed and updated.   Location of Pain for which the patient has been treated with regarding narcotics: back pain   Onset of this pain: Present for years   -Compliance with medication: Good compliance  - Number patient states they take daily: 4   -Reason for ongoing use of opioids back pain   What other measures have been tried outside of opioids surgery, anti inflammatory, injections, pt, home stretches  In the ongoing specialists regarding this condition previously back specialist none currently  -when was the last dose patient took? 8am today   The patient was advised the importance of maintaining medication and not using illegal substances with these.  Here for refills and follow up  The patient was educated that we can provide 3 monthly scripts for their medication, it is their responsibility to follow the instructions.  Side effects or complications from medications: Denies side effects  Patient is aware that pain medications are meant to minimize the severity of the pain to allow their pain levels to improve to allow for better function. They are aware of that pain medications cannot totally remove their pain.  Due for UDT ( at least once per year) (pain management contract is also completed at the time of the UDT): 08/07/2023  Scale of 1 to 10 ( 1 is least 10 is most) Your pain level without the medicine: 9 Your pain level with medication 4  Scale 1 to 10 ( 1-helps very little, 10 helps very well) How well does your pain medication reduce your pain so you can function better through out the day? 8  Quality of the pain: Aching throbbing burning  Persistence of the pain: Present all the time  Modifying factors: Worse with activity    Pamela Mendez is a 58 year old female with chronic  back pain who presents with worsening lower back pain radiating to the right leg.  She experiences constant lower back pain, predominantly on the right side, radiating to the back of her right leg, stopping just before the knee. The pain is exacerbated by certain activities and has worsened with recent weather changes despite regular use of gabapentin , which provides some relief. She engages in stretching exercises twice a week and walks daily with her mother.  She has a history of back surgery with rod placement, resulting in numbness in her foot and occasional tingling sensations attributed to nerve issues from the surgery. Gabapentin  helps manage these symptoms. No leg weakness is reported, although the pain makes it feel weak. No drowsiness or loopiness from medications and no bowel movement issues.  She is also on medication for cholesterol and diabetes management, including Ozempic , which initially caused diarrhea but no longer does. She reports no nausea or vomiting with Ozempic  and maintains a healthy diet. Her A1c was 6.0 in June, indicating good diabetes control.  She keeps her pain medication in a locked cabinet and has not experienced any issues with pharmacy refills.  She does have diabetes she takes her medicine she tries to watch her diet tries to fit in walking on a regular basis denies low sugar spells    Review of Systems     Objective:   Physical Exam  General-in no acute distress Eyes-no discharge Lungs-respiratory rate normal, CTA CV-no  murmurs,RRR Extremities skin warm dry no edema Neuro grossly normal Behavior normal, alert       Assessment & Plan:  1. Diabetes mellitus without complication (HCC) (Primary) Patient to do lab work before next visit, not having any low sugar spells A1c previously under good control - Hemoglobin A1c - Hepatic function panel - Basic metabolic panel with GFR  2. Primary hypertension Blood pressure good continue medication -  Hepatic function panel - Basic metabolic panel with GFR  3. Hyperlipidemia associated with type 2 diabetes mellitus (HCC) Previous lab work with good repeat labs before next visit - Lipid panel - Hepatic function panel  4. Chronic low back pain with sciatica, sciatica laterality unspecified, unspecified back pain laterality Stretching exercises discussed, to do stretches, continue medication  The patient was seen in followup for chronic pain. A review over at their current pain status was discussed. Drug registry was checked. Prescriptions were given.  Regular follow-up recommended. Discussion was held regarding the importance of compliance with medication as well as pain medication contract.  Patient was informed that medication may cause drowsiness and should not be combined  with other medications/alcohol or street drugs. If the patient feels medication is causing altered alertness then do not drive or operate dangerous equipment.  Should be noted that the patient appears to be meeting appropriate use of opioids and response.  Evidenced by improved function and decent pain control without significant side effects and no evidence of overt aberrancy issues.  Upon discussion with the patient today they understand that opioid therapy is optional and they feel that the pain has been refractory to reasonable conservative measures and is significant and affecting quality of life enough to warrant ongoing therapy and wishes to continue opioids.  Refills were provided.  [place]  medical Board guidelines regarding the pain medicine has been reviewed.  CDC guidelines most updated 2022 has been reviewed by the prescriber.  PDMP is checked on a regular basis yearly urine drug screen and pain management contract  Treatment plan for this patient includes #1-gentle stretching exercises as shown daily basis 2.  Mild strength exercises 3 times per week #3 continue pain medications #4 notify us  if  any digression   5. Immunization due Today - Flu vaccine trivalent PF, 6mos and older(Flulaval,Afluria,Fluarix,Fluzone)

## 2024-05-20 ENCOUNTER — Encounter: Payer: Self-pay | Admitting: Family Medicine

## 2024-05-23 ENCOUNTER — Encounter: Payer: Self-pay | Admitting: Family Medicine

## 2024-05-26 ENCOUNTER — Telehealth: Payer: Self-pay | Admitting: Pharmacy Technician

## 2024-05-26 ENCOUNTER — Other Ambulatory Visit (HOSPITAL_COMMUNITY): Payer: Self-pay

## 2024-05-26 NOTE — Telephone Encounter (Signed)
 Pharmacy Patient Advocate Encounter   Received notification from Patient Advice Request messages that prior authorization for oxyCODONE -Acetaminophen  5-325MG  tablets is required/requested.   Insurance verification completed.   The patient is insured through [employer] .   Per test claim: PA required; PA submitted to above mentioned insurance via Latent Key/confirmation #/EOC [facility] Status is pending

## 2024-05-26 NOTE — Telephone Encounter (Signed)
 Pharmacy Patient Advocate Encounter  Received notification from [employer] that Prior Authorization for oxyCODONE -Acetaminophen  5-325MG  tablets has been APPROVED from 05/26/2024 to 11/22/2024. Ran test claim, Copay is $4.00. This test claim was processed through [facility]- copay amounts may vary at other pharmacies due to pharmacy/plan contracts, or as the patient moves through the different stages of their insurance plan.   PA #/Case ID/Reference #: [id number]

## 2024-05-26 NOTE — Telephone Encounter (Signed)
 PA request has been Started. New Encounter has been or will be created for follow up. For additional info see Pharmacy Prior Auth telephone encounter from 05/26/2024.

## 2024-05-27 ENCOUNTER — Encounter: Payer: Self-pay | Admitting: Family Medicine

## 2024-06-02 ENCOUNTER — Other Ambulatory Visit: Payer: Self-pay | Admitting: Nurse Practitioner

## 2024-06-02 ENCOUNTER — Telehealth: Payer: Self-pay | Admitting: Family Medicine

## 2024-06-02 MED ORDER — MONTELUKAST SODIUM 10 MG PO TABS: 10.0000 mg | ORAL_TABLET | Freq: Every day | ORAL | 1 refills | Status: AC

## 2024-06-02 NOTE — Telephone Encounter (Signed)
 Refill on  montelukast  (SINGULAIR ) 10 MG tablet  [facility]

## 2024-06-18 ENCOUNTER — Other Ambulatory Visit: Payer: Self-pay | Admitting: Family Medicine

## 2024-06-18 MED ORDER — DOXYCYCLINE HYCLATE 100 MG PO TABS: 100.0000 mg | ORAL_TABLET | Freq: Two times a day (BID) | ORAL | 0 refills | Status: DC

## 2024-06-19 ENCOUNTER — Telehealth: Payer: Self-pay | Admitting: Family Medicine

## 2024-06-19 NOTE — Telephone Encounter (Signed)
 Patient had sinus symptoms over the past couple weeks sinus pressure pain discomfort.  She tried utilizing the call center to get an appointment and stated that she was unable to work past being told we had no availabilities for 4 months She denies any wheezing difficulty breathing Lungs were clear heart regular mild sinus tenderness Given that this has been going on for over 2 weeks reasonable to treat with doxycycline  twice daily for 10 days certainly follow-up if progressive troubles or problems

## 2024-06-30 ENCOUNTER — Encounter: Payer: Self-pay | Admitting: Family Medicine

## 2024-07-03 ENCOUNTER — Other Ambulatory Visit: Payer: Self-pay | Admitting: Family Medicine

## 2024-07-03 ENCOUNTER — Ambulatory Visit: Payer: Self-pay

## 2024-07-03 DIAGNOSIS — Z79891 Long term (current) use of opiate analgesic: Secondary | ICD-10-CM

## 2024-07-03 NOTE — Telephone Encounter (Signed)
 Copied from CRM #[id number]. Topic: Clinical - Medication Refill >> Jul 03, 2024 11:37 AM Amber H wrote: Medication: oxyCODONE -acetaminophen  (PERCOCET/ROXICET) 5-325 MG tablet  Has the patient contacted their pharmacy? Yes, they stated that they have not received anything from the provider.  (Agent: If no, request that the patient contact the pharmacy for the refill. If patient does not wish to contact the pharmacy document the reason why and proceed with request.) (Agent: If yes, when and what did the pharmacy advise?)  This is the patient's preferred pharmacy:  [id number] - [place], [place] - [address] AT SEC [address] [place] [address] Phone: [phone] Fax: [phone]  Is this the correct pharmacy for this prescription? Yes If no, delete pharmacy and type the correct one.   Has the prescription been filled recently? Yes, 05/2024  Is the patient out of the medication? Yes  Has the patient been seen for an appointment in the last year OR does the patient have an upcoming appointment? Yes  Can we respond through MyChart? Yes  Agent: Please be advised that Rx refills may take up to 3 business days. We ask that you follow-up with your pharmacy.

## 2024-07-03 NOTE — Telephone Encounter (Signed)
 FYI Only or Action Required?: Action required by provider: medication refill request.  Patient was last seen in primary care on 05/14/2024 by Alphonsa Glendia LABOR, MD.  Called Nurse Triage reporting Medication Refill.  Symptoms began n/a.  Interventions attempted: Other: n/a.  Symptoms are: n/a.  Triage Disposition: Call PCP When Office is Open  Patient/caregiver understands and will follow disposition?: No, wishes to speak with PCP   Copied from CRM #[id number]. Topic: Clinical - Red Word Triage >> Jul 03, 2024 11:39 AM Amber H wrote: [facility] that prompted transfer to Nurse Triage: Patient is complaining of having lower back pain. Currently of her oxyCODONE -acetaminophen  (PERCOCET/ROXICET) 5-325 MG tablet. Refill request was sent in. Reason for Disposition  Caller requesting a CONTROLLED substance prescription refill (e.g., narcotics, ADHD medicines)  Answer Assessment - Initial Assessment Questions Additional info: Patient requesting refill of percocet today to pharmacy, she is completely out of medication and her back pain is becoming uncontrolled. She is upset that she sent MyChart message on 10/27 and 10/29 and that both have been ignored, she can tell by the unopened mail symbol on her MyChart. Please advise if refill will be sent today or what else she needs to do.    1. DRUG NAME: What medicine do you need to have refilled?     Oxycodone -acetaminophen  2. REFILLS REMAINING: How many refills are remaining? Notes: The label on the medicine or pill bottle will show how many refills are remaining. If there are no refills remaining, then a renewal may be needed.     zero 3. EXPIRATION DATE: What is the expiration date? Note: The label states when the prescription will expire, and thus can no longer be refilled.)      4. PRESCRIBER: Who prescribed it? Note: The prescribing doctor or group is responsible for refill approvals..     pcp 5. PHARMACY: Have you contacted your pharmacy  (drugstore)? Note: Some pharmacies will contact the doctor (or NP/PA).      [facility]  6. SYMPTOMS: Do you have any symptoms?     Back pain, no new symptoms  Protocols used: Medication Refill and Renewal Call-A-AH

## 2024-07-03 NOTE — Telephone Encounter (Signed)
 Left a message for pt to return call or read mychart message , I spoke with [facility] pharmacist states they will have medication ready for pick by this afternoon .

## 2024-07-06 ENCOUNTER — Other Ambulatory Visit: Payer: Self-pay | Admitting: Family Medicine

## 2024-07-07 MED ORDER — OXYCODONE-ACETAMINOPHEN 5-325 MG PO TABS
ORAL_TABLET | ORAL | 0 refills | Status: AC
Start: 2024-07-07 — End: ?

## 2024-07-10 ENCOUNTER — Emergency Department (HOSPITAL_COMMUNITY)
Admission: EM | Admit: 2024-07-10 | Discharge: 2024-07-10 | Disposition: A | Discharge status: Home / Self Care | Attending: Emergency Medicine | Admitting: Emergency Medicine

## 2024-07-10 ENCOUNTER — Other Ambulatory Visit: Payer: Self-pay

## 2024-07-10 ENCOUNTER — Emergency Department (HOSPITAL_COMMUNITY)

## 2024-07-10 ENCOUNTER — Encounter (HOSPITAL_COMMUNITY): Payer: Self-pay | Admitting: Emergency Medicine

## 2024-07-10 DIAGNOSIS — Z79899 Other long term (current) drug therapy: Secondary | ICD-10-CM | POA: Insufficient documentation

## 2024-07-10 DIAGNOSIS — K219 Gastro-esophageal reflux disease without esophagitis: Secondary | ICD-10-CM | POA: Diagnosis not present

## 2024-07-10 DIAGNOSIS — I509 Heart failure, unspecified: Secondary | ICD-10-CM | POA: Insufficient documentation

## 2024-07-10 DIAGNOSIS — I110 Hypertensive heart disease with heart failure: Secondary | ICD-10-CM | POA: Diagnosis not present

## 2024-07-10 DIAGNOSIS — R079 Chest pain, unspecified: Secondary | ICD-10-CM | POA: Diagnosis not present

## 2024-07-10 DIAGNOSIS — J029 Acute pharyngitis, unspecified: Secondary | ICD-10-CM

## 2024-07-10 DIAGNOSIS — Z794 Long term (current) use of insulin: Secondary | ICD-10-CM | POA: Insufficient documentation

## 2024-07-10 DIAGNOSIS — R0789 Other chest pain: Secondary | ICD-10-CM | POA: Diagnosis not present

## 2024-07-10 LAB — TROPONIN T, HIGH SENSITIVITY
Troponin T High Sensitivity: <15 ng/L (ref 0–19)
Troponin T High Sensitivity: <15 ng/L (ref 0–19)

## 2024-07-10 LAB — CBC
HCT: 42.1 % (ref 36.0–46.0)
Hemoglobin: 13.9 g/dL (ref 12.0–15.0)
MCH: 29.4 pg (ref 26.0–34.0)
MCHC: 33 g/dL (ref 30.0–36.0)
MCV: 89 fL (ref 80.0–100.0)
Platelets: 254 K/uL (ref 150–400)
RBC: 4.73 MIL/uL (ref 3.87–5.11)
RDW: 12.7 % (ref 11.5–15.5)
WBC: 10.9 K/uL — ABNORMAL HIGH (ref 4.0–10.5)
nRBC: 0 % (ref 0.0–0.2)

## 2024-07-10 LAB — BASIC METABOLIC PANEL WITH GFR
Anion gap: 12 (ref 5–15)
BUN: 11 mg/dL (ref 6–20)
CO2: 24 mmol/L (ref 22–32)
Calcium: 9.3 mg/dL (ref 8.9–10.3)
Chloride: 104 mmol/L (ref 98–111)
Creatinine, Ser: 0.85 mg/dL (ref 0.44–1.00)
GFR, Estimated: >60 mL/min (ref >60)
Glucose, Bld: 128 mg/dL — ABNORMAL HIGH (ref 70–99)
Potassium: 3.1 mmol/L — ABNORMAL LOW (ref 3.5–5.1)
Sodium: 140 mmol/L (ref 135–145)

## 2024-07-10 MED ORDER — LIDOCAINE VISCOUS HCL 2 % MT SOLN
15.0000 mL | Freq: Once | OROMUCOSAL | Status: AC
  Administered 2024-07-10: 15 mL via ORAL
  Filled 2024-07-10: qty 15

## 2024-07-10 MED ORDER — ALUM & MAG HYDROXIDE-SIMETH 200-200-20 MG/5ML PO SUSP
30.0000 mL | Freq: Once | ORAL | Status: AC
  Administered 2024-07-10: 30 mL via ORAL
  Filled 2024-07-10: qty 30

## 2024-07-10 MED ORDER — FAMOTIDINE 20 MG PO TABS: 20.0000 mg | ORAL_TABLET | Freq: Two times a day (BID) | ORAL | 0 refills | Status: AC

## 2024-07-10 MED ORDER — POTASSIUM CHLORIDE CRYS ER 20 MEQ PO TBCR
40.0000 meq | EXTENDED_RELEASE_TABLET | Freq: Once | ORAL | Status: AC
  Administered 2024-07-10: 40 meq via ORAL
  Filled 2024-07-10: qty 2

## 2024-07-10 NOTE — ED Notes (Signed)
 Pt ambulated to treatment room with steady gait & no signs of distress.

## 2024-07-10 NOTE — Discharge Instructions (Signed)
 Thank you for coming to [facility]. You were seen for burning chest pain and sore throat. We did an exam, labs, and imaging, and these showed likely reflux symptoms.  Please add Pepcid  20 mg twice per day for 30 days to your medications.  Please avoid eating late at night or greasy/spicy foods.  Please do not take any NSAIDs or drink any alcohol. Please follow up with your primary care provider within 1 week.   Do not hesitate to return to the ED or call 911 if you experience: -Worsening symptoms -Nausea vomiting so severe you cannot eat or drink anything or take your medications -Lightheadedness, passing out -Fevers/chills -Anything else that concerns you

## 2024-07-10 NOTE — ED Triage Notes (Signed)
 Pt had chest pain with throat burning that woke her up this am. Pt then vomited. Was shob with lying down. States all day her throat has been burning and the pain goes into her back. No other radiation. States when she ate something pta it was hard for her to swallow and hurt throat. Takes protonix  for GERD.  Became dizzy a few minutes agobut not bad. Ambulatory. Color wnl. Non diaphoretic.

## 2024-07-10 NOTE — ED Provider Notes (Addendum)
 Pamela Mendez Provider Note   CSN: [id number] Arrival date & time: 07/10/24  1823     History  Chief Complaint  Patient presents with   Chest Pain   Sore Throat    Pamela Mendez is a 58 y.o. female with PMH as listed below who presents with burning chest pain and sore throat as well as burning left upper quadrant pain that started this morning and is lasted all day.  No h/o Of similar.  Felt short of breath when the pain got so bad but otherwise does not feel very short of breath.  Ate new lasagna last night and was wondering if that could cause it.  Takes Protonix  daily for GERD but otherwise takes no medications.  No cough, hemoptysis, hematemesis, nausea vomiting, urinary symptoms, diarrhea or constipation.  Does have a history of heart failure without any changes in her medications, or leg swelling.  No other flulike symptoms like nasal congestion or bodyaches.   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 Authorizing Provider  amLODipine  (NORVASC ) 5 MG tablet Take 5 mg by mouth daily. 03/01/24  Yes [provider]  aspirin -acetaminophen -caffeine (EXCEDRIN MIGRAINE) 250-250-65 MG tablet Take 2 tablets by mouth every 6 (six) hours as needed for headache or migraine.   Yes [provider]  azelastine  (ASTELIN ) 0.1 % nasal spray Place 2 sprays into both nostrils 2 (two) times daily. 03/24/20  Yes Luking, Glendia LABOR, MD   cetirizine  (ZYRTEC ) 10 MG tablet TAKE 1 TABLET BY MOUTH EVERY DAY 12/31/23  Yes Luking, Glendia LABOR, MD  gabapentin  (NEURONTIN ) 300 MG capsule 2 qam 1qnoon then 2qevening Patient taking differently: Take 300-600 mg by mouth 3 (three) times daily. 2 qam 1qnoon then 2qevening 05/14/24  Yes Luking, Glendia LABOR, MD  metFORMIN  (GLUCOPHAGE ) 500 MG tablet Take 1 tablet (500 mg total) by mouth 2 (two) times daily with a meal. 05/14/24  Yes Luking, Glendia LABOR, MD  montelukast  (SINGULAIR ) 10 MG tablet Take 1 tablet (10 mg total) by mouth daily. Patient taking differently: Take 10 mg by mouth at bedtime. 06/02/24  Yes Mauro Elveria BROCKS, NP  Multiple Vitamin (MULTIVITAMIN WITH MINERALS) TABS tablet Take 1 tablet by mouth daily.   Yes [provider]  Olopatadine  HCl 0.2 % SOLN Apply 1 drop to eye at bedtime as needed. Patient taking differently: Apply 1 drop to eye at bedtime as needed (allergies). 06/19/19  Yes Luking, Glendia LABOR, MD  oxyCODONE -acetaminophen  (PERCOCET/ROXICET) 5-325 MG tablet TAKE (1) TABLET BY MOUTH (4) TIMES DAILY AS NEEDED. 05/14/24  Yes Luking, Glendia LABOR, MD  pantoprazole  (PROTONIX ) 40 MG tablet Take 1 tablet (40 mg total) by mouth daily. 11/07/23  Yes Luking, Glendia LABOR, MD  Phenylephrine-Aspirin  (ALKA-SELTZER PLUS SINUS PO) Take 1 capsule by mouth 3 (three) times daily as needed (  sinus issues). Only takes three times daily if pt has sinus infection   Yes [provider]  potassium chloride  SA (KLOR-CON  M20) 20 MEQ tablet Take 2 tablets qam 2 tablets at noon and 2 in the evening. Patient taking differently: Take 20 mEq by mouth daily as needed (when taking Torsemide ). Take 1 TABLET IN THE MORNING AS NEEDED 11/16/21  Yes Luking, Glendia LABOR, MD  rosuvastatin  (CRESTOR ) 20 MG tablet TAKE 1 TABLET(20 MG) BY MOUTH DAILY 01/15/24  Yes Luking, Scott A, MD  Semaglutide ,0.25 or 0.5MG /DOS, 2 MG/3ML SOPN .25 mg for once a week Patient taking differently: Inject 0.25 mg into the skin every Saturday. .25 mg for  once a week 02/13/24  Yes Luking, Scott A, MD  torsemide  (DEMADEX ) 20 MG tablet TAKE 2 TABLETS BY MOUTH EVERY MORNING AND 1 TABLETS AT NOON. Patient taking differently: Take 20 mg by mouth daily as needed (swelling). 11/16/21  Yes Luking, Glendia LABOR, MD  doxycycline  (VIBRA -TABS) 100 MG tablet Take 1 tablet (100 mg total) by mouth 2 (two) times daily. Patient not taking: Reported on 07/10/2024 06/18/24   Alphonsa Glendia LABOR, MD  famotidine  (PEPCID ) 20 MG tablet Take 1 tablet (20 mg total) by mouth 2 (two) times daily. 1 every day prn break through 07/10/24 08/09/24  Franklyn Sid SAILOR, MD  oxyCODONE -acetaminophen  (PERCOCET/ROXICET) 5-325 MG tablet 1 pill 4 times daily as needed severe pain caution drowsiness Patient not taking: Reported on 07/10/2024 05/14/24   Alphonsa Glendia LABOR, MD  oxyCODONE -acetaminophen  (PERCOCET/ROXICET) 5-325 MG tablet 1 pill 4 times daily as needed severe pain caution drowsiness Patient not taking: Reported on 07/10/2024 07/07/24   Alphonsa Glendia LABOR, MD      Allergies    Cefzil [cefprozil], Sulfa antibiotics, Moxifloxacin hcl in nacl, Augmentin [amoxicillin-pot clavulanate], and Neomycin -colist-hc-thonzonium    Review of Systems   Review of Systems A 10 point review of systems was performed and is negative unless otherwise reported in HPI.  Physical Exam Updated Vital Signs BP (!) 119/53   Pulse 85   Temp 97.9 F (36.6 C) (Temporal)   Resp 18   SpO2 100%  Physical Exam General: Normal appearing female, lying in bed.  HEENT: PERRLA, Sclera anicteric, MMM, trachea midline.  Clear oropharynx.  No uvular edema.  Symmetric tonsillar pillars.  No tonsillar exudates.  No trismus. Cardiology: RRR, no murmurs/rubs/gallops. BL radial and DP pulses equal bilaterally.  Resp: Normal respiratory rate and effort. CTAB, no wheezes, rhonchi, crackles.  Abd: Soft, non-tender, non-distended. No rebound tenderness or guarding.  GU: Deferred. MSK: No peripheral edema or signs of trauma. Extremities  without deformity or TTP. No cyanosis or clubbing. Skin: warm, dry. No rashes or lesions. Back: No CVA tenderness Neuro: A&Ox4, CNs II-XII grossly intact. MAEs. Sensation grossly intact.  Psych: Normal mood and affect.   ED Results / Procedures / Treatments   Labs (all labs ordered are listed, but only abnormal results are displayed) Labs Reviewed  BASIC METABOLIC PANEL WITH GFR - Abnormal; Notable for the following components:      Result Value   Potassium 3.1 (*)    Glucose, Bld 128 (*)    All other components within normal limits  CBC - Abnormal; Notable for the following components:   WBC 10.9 (*)    All other components within normal limits  TROPONIN T, HIGH SENSITIVITY  TROPONIN T, HIGH SENSITIVITY    EKG EKG Interpretation Date/Time:  Thursday July 10 2024 18:35:41 EST Ventricular Rate:  87 PR Interval:  194 QRS Duration:  86 QT Interval:  370 QTC Calculation: 445 R Axis:   50  Text Interpretation: Normal sinus rhythm Cannot rule out Anterior infarct , age undetermined Confirmed by Franklyn Gills [phone]) on 07/10/2024 9:39:06 PM  Radiology DG Chest 2 View Result Date: 07/10/2024 CLINICAL DATA:  Chest pain, vomiting EXAM: CHEST - 2 VIEW COMPARISON:  01/13/2022 FINDINGS: Frontal and lateral views of the chest demonstrate an unremarkable cardiac silhouette. No acute airspace disease, effusion, or pneumothorax. No acute bony abnormalities. IMPRESSION: 1. No acute intrathoracic process. Electronically Signed   By: Ozell Daring M.D.   On: 07/10/2024 18:52    Procedures Procedures    Medications Ordered in ED Medications  potassium chloride  SA (KLOR-CON  M) CR tablet 40 mEq (40 mEq Oral Given 07/10/24 2150)  alum & mag hydroxide-simeth (MAALOX/MYLANTA) 200-200-20 MG/5ML suspension 30 mL (30 mLs Oral Given 07/10/24 2150)    And  lidocaine  (XYLOCAINE ) 2 % viscous mouth solution 15 mL (15 mLs Oral Given 07/10/24 2150)    ED Course/ Medical Decision Making/ A&P                           Medical Decision Making Amount and/or Complexity of Data Reviewed Labs: ordered. Decision-making details documented in ED Course. Radiology: ordered. Decision-making details documented in ED Course.  Risk OTC drugs. Prescription drug management.    This patient presents to the ED for concern of burning chest and throat pain, this involves an extensive number of treatment options, and is a complaint that carries with it a high risk of complications and morbidity.  I considered the following differential and admission for this acute, potentially life threatening condition.  Patient is overall very well-appearing and hemodynamically stable  MDM:    Very low concern for ACS or arrhythmia with normal EKG and negative troponin x 2.  No cough fevers chills or concern for viral illness or pneumonia, chest x-ray clear.  Had reported some shortness of breath with pain but otherwise no short of breath.  No hypoxia or tachypnea.  Very low concern for PE or dissection based on clinical circumstances.  No concern for heart failure exacerbation.  No electrolyte derangements or hypo-/hyperglycemia.  Patient likely with reflux given burning chest pain, left upper quadrant pain and sore throat.  She was given a GI cocktail with improvement in her symptoms.  Recommended adding Pepcid  20 mg twice per day to her Protonix  and following up with PCP within 1 week.  She does not drink any alcohol and does not take NSAIDs.  Recommended avoiding fatty/greasy foods and eating late at night.  Given discharge information about GERD and diet changes.  Given discharge instructions and return precautions, all questions answered to patient satisfaction.  Clinical Course as of 07/10/24 2254  Thu Jul 10, 2024  2138 Troponin T High Sensitivity: <15 [HN]  2138 DG Chest 2 View . No acute intrathoracic process [HN]  2224 Troponin T High Sensitivity: <15 Neg x2 [HN]    Clinical Course User Index [HN] Franklyn Gills SAILOR, MD    Labs: I Ordered, and personally interpreted labs.  The pertinent results include: Those listed above  Imaging Studies ordered: I ordered imaging studies including chest x-ray I independently visualized and interpreted imaging. I agree with the radiologist interpretation  Additional history obtained from chart review, husband at bedside.   Cardiac Monitoring: The patient was maintained on a cardiac monitor.  I personally viewed and interpreted  the cardiac monitored which showed an underlying rhythm of: Sinus rhythm  Reevaluation: After the interventions noted above, I reevaluated the patient and found that they have :improved  Social Determinants of Health:  lives independently  Disposition: DC  Co morbidities that complicate the patient evaluation  Past Medical History:  Diagnosis Date   Abdominal pain    Unspecified site   Anxiety    Anxiety and depression    Cardiomyopathy 2004   mild, post-partum   CHF (congestive heart failure) (HCC) 2004   post-partum   Chronic back pain    Chronic left hip pain    Depression    Gastritis    GERD (gastroesophageal reflux disease)    H/O echocardiogram 2012   EF 55-60%   Hyperlipidemia    Hypertension    Migraine headache without aura    Vertigo      Medicines Meds ordered this encounter  Medications   potassium chloride  SA (KLOR-CON  M) CR tablet 40 mEq   AND Linked Order Group    alum & mag hydroxide-simeth (MAALOX/MYLANTA) 200-200-20 MG/5ML suspension 30 mL    lidocaine  (XYLOCAINE ) 2 % viscous mouth solution 15 mL   famotidine  (PEPCID ) 20 MG tablet    Sig: Take 1 tablet (20 mg total) by mouth 2 (two) times daily. 1 every day prn break through    Dispense:  60 tablet    Refill:  0    I have reviewed the patients home medicines and have made adjustments as needed  Problem List / ED Course: Problem List Items Addressed This Visit   None Visit Diagnoses       Chest pain due to gastrointestinal  reflux disease    -  Primary   Relevant Medications   alum & mag hydroxide-simeth (MAALOX/MYLANTA) 200-200-20 MG/5ML suspension 30 mL (Completed)   famotidine  (PEPCID ) 20 MG tablet     Sore throat               Franklyn Sid SAILOR, MD 07/10/24 2254

## 2024-08-14 ENCOUNTER — Ambulatory Visit: Admitting: Family Medicine

## 2024-08-14 ENCOUNTER — Encounter: Payer: Self-pay | Admitting: Family Medicine

## 2024-08-14 VITALS — BP 114/76 | HR 81 | Temp 98.1°F | Ht 66.0 in | Wt 204.0 lb

## 2024-08-14 DIAGNOSIS — E785 Hyperlipidemia, unspecified: Secondary | ICD-10-CM | POA: Diagnosis not present

## 2024-08-14 DIAGNOSIS — E1169 Type 2 diabetes mellitus with other specified complication: Secondary | ICD-10-CM | POA: Diagnosis not present

## 2024-08-14 DIAGNOSIS — I10 Essential (primary) hypertension: Secondary | ICD-10-CM

## 2024-08-14 DIAGNOSIS — E119 Type 2 diabetes mellitus without complications: Secondary | ICD-10-CM | POA: Diagnosis not present

## 2024-08-14 DIAGNOSIS — Z79891 Long term (current) use of opiate analgesic: Secondary | ICD-10-CM

## 2024-08-14 DIAGNOSIS — Z7984 Long term (current) use of oral hypoglycemic drugs: Secondary | ICD-10-CM | POA: Diagnosis not present

## 2024-08-14 DIAGNOSIS — Z79899 Other long term (current) drug therapy: Secondary | ICD-10-CM | POA: Diagnosis not present

## 2024-08-14 MED ORDER — DOXYCYCLINE HYCLATE 100 MG PO TABS: 100.0000 mg | ORAL_TABLET | Freq: Two times a day (BID) | ORAL | 0 refills | Status: DC

## 2024-08-14 MED ORDER — METFORMIN HCL 500 MG PO TABS: 500.0000 mg | ORAL_TABLET | Freq: Two times a day (BID) | ORAL | 1 refills | Status: AC

## 2024-08-14 NOTE — Progress Notes (Addendum)
 "  Subjective:    Patient ID: Pamela Mendez, female    DOB: November 07, 1965, 58 y.o.   MRN: [id number]  HPI  Pain management  This patient was seen today for chronic pain  The medication list was reviewed and updated.   Location of Pain for which the patient has been treated with regarding narcotics: Lumbar pain  Onset of this pain: Present for years   -Compliance with medication: Good compliance with medicine  - Number patient states they take daily: 4 tablets daily  -Reason for ongoing use of opioids cannot get adequate relief with Tylenol  NSAIDs etc.  What other measures have been tried outside of opioids Tylenol  NSAIDs exercises surgery This patient has had 2 separate surgeries back in 2005 2010 Since then she has been on pain medication on a regular basis She is consistent with her pain medicine use She does not abuse it It does reduce her pain to where she is more able to be functional All of the criteria for chronic pain management is followed She does her yearly urine drug screen She is seen here every 3 months PDMP is checked We have read and follow the CBC and state guidelines for opioid use The medication is medically necessary for her Hopefully there is no reason the insurance company will deny her We have had her on this medication for years and she has been consistent with it and it has been helpful for her In the ongoing specialists regarding this condition none currently  -when was the last dose patient took?  Past 24 hours  The patient was advised the importance of maintaining medication and not using illegal substances with these.  Here for refills and follow up  The patient was educated that we can provide 3 monthly scripts for their medication, it is their responsibility to follow the instructions.  Side effects or complications from medications: Denies side effects  Patient is aware that pain medications are meant to minimize the severity of the pain  to allow their pain levels to improve to allow for better function. They are aware of that pain medications cannot totally remove their pain.  Due for UDT ( at least once per year) (pain management contract is also completed at the time of the UDT): Due on next visit  Scale of 1 to 10 ( 1 is least 10 is most) Your pain level without the medicine: 8 Your pain level with medication 4  Scale 1 to 10 ( 1-helps very little, 10 helps very well) How well does your pain medication reduce your pain so you can function better through out the day? 8  Quality of the pain: Throbbing aching  Persistence of the pain: Present all time  Modifying factors: Worse with activity  Patient taking her medicine on a regular basis as directed for diabetes and keep her sugar under control  Taking the metformin  on a regular basis Patient relates a lot of sinus pressure and drainage aching pain and discomfort present for the past couple weeks Takes her cholesterol medicine regular basis without trouble Labs previously ordered patient will do these labs  Review of Systems     Objective:   Physical Exam  General-in no acute distress Eyes-no discharge Lungs-respiratory rate normal, CTA CV-no murmurs,RRR Extremities skin warm dry no edema Neuro grossly normal Behavior normal, alert       Assessment & Plan:   1. Encounter for long-term opiate analgesic use The patient was seen in followup for chronic  pain. A review over at their current pain status was discussed. Drug registry was checked. Prescriptions were given.  Regular follow-up recommended. Discussion was held regarding the importance of compliance with medication as well as pain medication contract.  Patient was informed that medication may cause drowsiness and should not be combined  with other medications/alcohol or street drugs. If the patient feels medication is causing altered alertness then do not drive or operate dangerous  equipment.  Should be noted that the patient appears to be meeting appropriate use of opioids and response.  Evidenced by improved function and decent pain control without significant side effects and no evidence of overt aberrancy issues.  Upon discussion with the patient today they understand that opioid therapy is optional and they feel that the pain has been refractory to reasonable conservative measures and is significant and affecting quality of life enough to warrant ongoing therapy and wishes to continue opioids.  Refills were provided.  [place]  medical Board guidelines regarding the pain medicine has been reviewed.  CDC guidelines most updated 2022 has been reviewed by the prescriber.  PDMP is checked on a regular basis yearly urine drug screen and pain management contract  Treatment plan for this patient includes #1-gentle stretching exercises as shown daily basis 2.  Mild strength exercises 3 times per week #3 continue pain medications #4 notify us  if any digression  - oxyCODONE -acetaminophen  (PERCOCET/ROXICET) 5-325 MG tablet; 1 pill 4 times daily as needed severe pain caution drowsiness  Dispense: 120 tablet; Refill: 0 - oxyCODONE -acetaminophen  (PERCOCET/ROXICET) 5-325 MG tablet; 1 pill 4 times daily as needed severe pain caution drowsiness  Dispense: 120 tablet; Refill: 0 - oxyCODONE -acetaminophen  (PERCOCET/ROXICET) 5-325 MG tablet; TAKE (1) TABLET BY MOUTH (4) TIMES DAILY AS NEEDED.  Dispense: 120 tablet; Refill: 0  2. Diabetes mellitus without complication (HCC) Metformin  continue current measures Await A1c Healthy diet  - metFORMIN  (GLUCOPHAGE ) 500 MG tablet; Take 1 tablet (500 mg total) by mouth 2 (two) times daily with a meal.  Dispense: 180 tablet; Refill: 1  3. Hyperlipidemia associated with type 2 diabetes mellitus (HCC) (Primary) Continue statin Healthy diet Await results of labs  4. Primary hypertension Blood pressure good control continue current measures  healthy diet  5. High risk medication use Labs ordered Follow-up in approximately 3 months "

## 2024-08-30 ENCOUNTER — Encounter: Payer: Self-pay | Admitting: Family Medicine

## 2024-09-01 ENCOUNTER — Other Ambulatory Visit: Payer: Self-pay

## 2024-09-01 ENCOUNTER — Encounter: Payer: Self-pay | Admitting: Family Medicine

## 2024-09-01 DIAGNOSIS — E119 Type 2 diabetes mellitus without complications: Secondary | ICD-10-CM

## 2024-09-01 DIAGNOSIS — R7309 Other abnormal glucose: Secondary | ICD-10-CM

## 2024-09-01 MED ORDER — SEMAGLUTIDE(0.25 OR 0.5MG/DOS) 2 MG/3ML ~~LOC~~ SOPN: PEN_INJECTOR | SUBCUTANEOUS | 2 refills | Status: AC

## 2024-09-06 ENCOUNTER — Ambulatory Visit: Payer: Self-pay | Admitting: Family Medicine

## 2024-09-06 LAB — LIPID PANEL
Chol/HDL Ratio: 2.3 ratio (ref 0.0–4.4)
Cholesterol, Total: 133 mg/dL (ref 100–199)
HDL: 57 mg/dL
LDL Chol Calc (NIH): 57 mg/dL (ref 0–99)
Triglycerides: 103 mg/dL (ref 0–149)
VLDL Cholesterol Cal: 19 mg/dL (ref 5–40)

## 2024-09-06 LAB — BASIC METABOLIC PANEL WITH GFR
BUN/Creatinine Ratio: 14 (ref 9–23)
BUN: 13 mg/dL (ref 6–24)
CO2: 23 mmol/L (ref 20–29)
Calcium: 9.1 mg/dL (ref 8.7–10.2)
Chloride: 106 mmol/L (ref 96–106)
Creatinine, Ser: 0.92 mg/dL (ref 0.57–1.00)
Glucose: 109 mg/dL — ABNORMAL HIGH (ref 70–99)
Potassium: 3.8 mmol/L (ref 3.5–5.2)
Sodium: 143 mmol/L (ref 134–144)
eGFR: 73 mL/min/1.73

## 2024-09-06 LAB — HEPATIC FUNCTION PANEL
ALT: 23 IU/L (ref 0–32)
AST: 26 IU/L (ref 0–40)
Albumin: 4.2 g/dL (ref 3.8–4.9)
Alkaline Phosphatase: 56 IU/L (ref 49–135)
Bilirubin Total: 0.5 mg/dL (ref 0.0–1.2)
Bilirubin, Direct: 0.18 mg/dL (ref 0.00–0.40)
Total Protein: 7.1 g/dL (ref 6.0–8.5)

## 2024-09-06 LAB — HEMOGLOBIN A1C
Est. average glucose Bld gHb Est-mCnc: 134 mg/dL
Hgb A1c MFr Bld: 6.3 % — ABNORMAL HIGH (ref 4.8–5.6)

## 2024-09-16 ENCOUNTER — Telehealth: Payer: Self-pay | Admitting: Family Medicine

## 2024-09-16 NOTE — Telephone Encounter (Signed)
 Nurses Patient is on pain medication She gets her pain medicine through [facility] on [address] On a yearly basis she has to get a prior approval to get her pain medicine Please have the pharmacy team work with this She states that she is due for a prior approval on her pain medicines thank you

## 2024-09-17 ENCOUNTER — Other Ambulatory Visit (HOSPITAL_COMMUNITY): Payer: Self-pay

## 2024-09-19 ENCOUNTER — Other Ambulatory Visit: Payer: Self-pay | Admitting: Family Medicine

## 2024-09-19 ENCOUNTER — Encounter: Payer: Self-pay | Admitting: Family Medicine

## 2024-09-25 ENCOUNTER — Other Ambulatory Visit: Payer: Self-pay | Admitting: Family Medicine

## 2024-09-25 ENCOUNTER — Encounter: Payer: Self-pay | Admitting: Family Medicine

## 2024-09-25 MED ORDER — DOXYCYCLINE HYCLATE 100 MG PO TABS: 100.0000 mg | ORAL_TABLET | Freq: Two times a day (BID) | ORAL | 0 refills | Status: AC

## 2024-11-11 ENCOUNTER — Ambulatory Visit: Admitting: Family Medicine
